# Patient Record
Sex: Male | Born: 1970 | Race: Asian | Hispanic: No | Marital: Married | Smoking: Never smoker
Health system: Southern US, Community
[De-identification: ages and names within clinical notes are randomized; demographics above are authoritative.]

## PROBLEM LIST (undated history)

## (undated) DIAGNOSIS — N200 Calculus of kidney: Secondary | ICD-10-CM

## (undated) DIAGNOSIS — M109 Gout, unspecified: Secondary | ICD-10-CM

## (undated) DIAGNOSIS — N183 Chronic kidney disease, stage 3 unspecified: Secondary | ICD-10-CM

## (undated) DIAGNOSIS — R9431 Abnormal electrocardiogram [ECG] [EKG]: Secondary | ICD-10-CM

## (undated) DIAGNOSIS — Z87442 Personal history of urinary calculi: Secondary | ICD-10-CM

---

## 2009-06-10 ENCOUNTER — Ambulatory Visit: Payer: Self-pay | Admitting: Diagnostic Radiology

## 2009-06-10 ENCOUNTER — Emergency Department (HOSPITAL_BASED_OUTPATIENT_CLINIC_OR_DEPARTMENT_OTHER): Admission: EM | Admit: 2009-06-10 | Discharge: 2009-06-10 | Payer: Self-pay | Admitting: Emergency Medicine

## 2010-11-02 LAB — URINE MICROSCOPIC-ADD ON

## 2010-11-02 LAB — URINE CULTURE

## 2010-11-02 LAB — URINALYSIS, ROUTINE W REFLEX MICROSCOPIC
Bilirubin Urine: NEGATIVE
Hgb urine dipstick: NEGATIVE
Ketones, ur: 15 mg/dL — AB
Specific Gravity, Urine: 1.025 (ref 1.005–1.030)
Urobilinogen, UA: 0.2 mg/dL (ref 0.0–1.0)

## 2013-07-28 ENCOUNTER — Emergency Department (HOSPITAL_BASED_OUTPATIENT_CLINIC_OR_DEPARTMENT_OTHER): Payer: BC Managed Care – PPO

## 2013-07-28 ENCOUNTER — Encounter (HOSPITAL_BASED_OUTPATIENT_CLINIC_OR_DEPARTMENT_OTHER): Payer: Self-pay | Admitting: Emergency Medicine

## 2013-07-28 ENCOUNTER — Inpatient Hospital Stay (HOSPITAL_BASED_OUTPATIENT_CLINIC_OR_DEPARTMENT_OTHER)
Admission: EM | Admit: 2013-07-28 | Discharge: 2013-08-07 | DRG: 871 | Disposition: A | Discharge status: Home / Self Care | Payer: BC Managed Care – PPO | Attending: Internal Medicine | Admitting: Internal Medicine

## 2013-07-28 DIAGNOSIS — R079 Chest pain, unspecified: Secondary | ICD-10-CM

## 2013-07-28 DIAGNOSIS — R7881 Bacteremia: Secondary | ICD-10-CM

## 2013-07-28 DIAGNOSIS — J9601 Acute respiratory failure with hypoxia: Secondary | ICD-10-CM | POA: Diagnosis present

## 2013-07-28 DIAGNOSIS — E872 Acidosis, unspecified: Secondary | ICD-10-CM | POA: Diagnosis present

## 2013-07-28 DIAGNOSIS — A419 Sepsis, unspecified organism: Secondary | ICD-10-CM | POA: Diagnosis present

## 2013-07-28 DIAGNOSIS — R197 Diarrhea, unspecified: Secondary | ICD-10-CM

## 2013-07-28 DIAGNOSIS — G4700 Insomnia, unspecified: Secondary | ICD-10-CM | POA: Diagnosis not present

## 2013-07-28 DIAGNOSIS — R21 Rash and other nonspecific skin eruption: Secondary | ICD-10-CM | POA: Diagnosis present

## 2013-07-28 DIAGNOSIS — E860 Dehydration: Secondary | ICD-10-CM

## 2013-07-28 DIAGNOSIS — E46 Unspecified protein-calorie malnutrition: Secondary | ICD-10-CM | POA: Diagnosis present

## 2013-07-28 DIAGNOSIS — J80 Acute respiratory distress syndrome: Secondary | ICD-10-CM

## 2013-07-28 DIAGNOSIS — E8809 Other disorders of plasma-protein metabolism, not elsewhere classified: Secondary | ICD-10-CM | POA: Diagnosis not present

## 2013-07-28 DIAGNOSIS — N170 Acute kidney failure with tubular necrosis: Secondary | ICD-10-CM | POA: Diagnosis present

## 2013-07-28 DIAGNOSIS — A409 Streptococcal sepsis, unspecified: Principal | ICD-10-CM | POA: Diagnosis present

## 2013-07-28 DIAGNOSIS — R9431 Abnormal electrocardiogram [ECG] [EKG]: Secondary | ICD-10-CM

## 2013-07-28 DIAGNOSIS — R0789 Other chest pain: Secondary | ICD-10-CM

## 2013-07-28 DIAGNOSIS — L0390 Cellulitis, unspecified: Secondary | ICD-10-CM

## 2013-07-28 DIAGNOSIS — E440 Moderate protein-calorie malnutrition: Secondary | ICD-10-CM | POA: Diagnosis not present

## 2013-07-28 DIAGNOSIS — I959 Hypotension, unspecified: Secondary | ICD-10-CM

## 2013-07-28 DIAGNOSIS — N390 Urinary tract infection, site not specified: Secondary | ICD-10-CM

## 2013-07-28 DIAGNOSIS — M6282 Rhabdomyolysis: Secondary | ICD-10-CM

## 2013-07-28 DIAGNOSIS — E871 Hypo-osmolality and hyponatremia: Secondary | ICD-10-CM | POA: Diagnosis not present

## 2013-07-28 DIAGNOSIS — I248 Other forms of acute ischemic heart disease: Secondary | ICD-10-CM | POA: Diagnosis not present

## 2013-07-28 DIAGNOSIS — IMO0002 Reserved for concepts with insufficient information to code with codable children: Secondary | ICD-10-CM | POA: Diagnosis present

## 2013-07-28 DIAGNOSIS — L02219 Cutaneous abscess of trunk, unspecified: Secondary | ICD-10-CM | POA: Diagnosis present

## 2013-07-28 DIAGNOSIS — I2489 Other forms of acute ischemic heart disease: Secondary | ICD-10-CM | POA: Diagnosis not present

## 2013-07-28 DIAGNOSIS — K7689 Other specified diseases of liver: Secondary | ICD-10-CM | POA: Diagnosis present

## 2013-07-28 DIAGNOSIS — D696 Thrombocytopenia, unspecified: Secondary | ICD-10-CM | POA: Diagnosis present

## 2013-07-28 DIAGNOSIS — I219 Acute myocardial infarction, unspecified: Secondary | ICD-10-CM | POA: Diagnosis not present

## 2013-07-28 DIAGNOSIS — J9600 Acute respiratory failure, unspecified whether with hypoxia or hypercapnia: Secondary | ICD-10-CM | POA: Diagnosis not present

## 2013-07-28 DIAGNOSIS — K760 Fatty (change of) liver, not elsewhere classified: Secondary | ICD-10-CM

## 2013-07-28 DIAGNOSIS — N179 Acute kidney failure, unspecified: Secondary | ICD-10-CM

## 2013-07-28 DIAGNOSIS — J690 Pneumonitis due to inhalation of food and vomit: Secondary | ICD-10-CM | POA: Diagnosis not present

## 2013-07-28 DIAGNOSIS — E876 Hypokalemia: Secondary | ICD-10-CM | POA: Diagnosis not present

## 2013-07-28 LAB — CBC WITH DIFFERENTIAL/PLATELET
Band Neutrophils: 5 % (ref 0–10)
Basophils Relative: 0 % (ref 0–1)
HCT: 46.8 % (ref 39.0–52.0)
Hemoglobin: 16.6 g/dL (ref 13.0–17.0)
Lymphs Abs: 0.6 10*3/uL — ABNORMAL LOW (ref 0.7–4.0)
MCH: 30.5 pg (ref 26.0–34.0)
MCHC: 35.5 g/dL (ref 30.0–36.0)
Monocytes Absolute: 0.8 10*3/uL (ref 0.1–1.0)

## 2013-07-28 LAB — COMPREHENSIVE METABOLIC PANEL
BUN: 44 mg/dL — ABNORMAL HIGH (ref 6–23)
Calcium: 9.3 mg/dL (ref 8.4–10.5)
GFR calc Af Amer: 21 mL/min — ABNORMAL LOW (ref >90)
Glucose, Bld: 171 mg/dL — ABNORMAL HIGH (ref 70–99)
Total Protein: 8.3 g/dL (ref 6.0–8.3)

## 2013-07-28 LAB — TROPONIN I: Troponin I: <0.3 ng/mL (ref <0.30)

## 2013-07-28 LAB — D-DIMER, QUANTITATIVE: D-Dimer, Quant: 3.75 ug/mL-FEU — ABNORMAL HIGH (ref 0.00–0.48)

## 2013-07-28 LAB — GLUCOSE, CAPILLARY: Glucose-Capillary: 149 mg/dL — ABNORMAL HIGH (ref 70–99)

## 2013-07-28 LAB — PROTIME-INR: Prothrombin Time: 14 seconds (ref 11.6–15.2)

## 2013-07-28 LAB — PRO B NATRIURETIC PEPTIDE: Pro B Natriuretic peptide (BNP): 500.6 pg/mL — ABNORMAL HIGH (ref 0–125)

## 2013-07-28 MED ORDER — SODIUM CHLORIDE 0.9 % IV SOLN
INTRAVENOUS | Status: DC
  Administered 2013-07-28: 125 mL/h via INTRAVENOUS

## 2013-07-28 MED ORDER — SODIUM CHLORIDE 0.9 % IV BOLUS (SEPSIS)
1000.0000 mL | Freq: Once | INTRAVENOUS | Status: AC
  Administered 2013-07-28: 1000 mL via INTRAVENOUS

## 2013-07-28 MED ORDER — SODIUM CHLORIDE 0.9 % IV BOLUS (SEPSIS)
1000.0000 mL | Freq: Once | INTRAVENOUS | Status: AC
  Administered 2013-07-28 (×2): 1000 mL via INTRAVENOUS

## 2013-07-28 MED ORDER — ONDANSETRON HCL 4 MG/2ML IJ SOLN
4.0000 mg | Freq: Three times a day (TID) | INTRAMUSCULAR | Status: DC | PRN
  Administered 2013-07-28: 4 mg via INTRAVENOUS
  Filled 2013-07-28: qty 2

## 2013-07-28 MED ORDER — FENTANYL CITRATE 0.05 MG/ML IJ SOLN
100.0000 ug | Freq: Once | INTRAMUSCULAR | Status: AC
  Administered 2013-07-28: 100 ug via INTRAVENOUS
  Filled 2013-07-28: qty 2

## 2013-07-28 MED ORDER — PIPERACILLIN-TAZOBACTAM 3.375 G IVPB
3.3750 g | Freq: Once | INTRAVENOUS | Status: AC
  Administered 2013-07-28: 3.375 g via INTRAVENOUS
  Filled 2013-07-28: qty 50

## 2013-07-28 MED ORDER — HYDROMORPHONE HCL PF 1 MG/ML IJ SOLN
1.0000 mg | INTRAMUSCULAR | Status: DC | PRN
  Administered 2013-07-28 – 2013-07-29 (×2): 1 mg via INTRAVENOUS
  Filled 2013-07-28 (×2): qty 1

## 2013-07-28 MED ORDER — HEPARIN BOLUS VIA INFUSION
5000.0000 [IU] | Freq: Once | INTRAVENOUS | Status: AC
  Administered 2013-07-28: 5000 [IU] via INTRAVENOUS

## 2013-07-28 MED ORDER — VANCOMYCIN HCL IN DEXTROSE 1-5 GM/200ML-% IV SOLN
1000.0000 mg | Freq: Once | INTRAVENOUS | Status: AC
  Administered 2013-07-28: 1000 mg via INTRAVENOUS
  Filled 2013-07-28: qty 200

## 2013-07-28 MED ORDER — HEPARIN (PORCINE) IN NACL 100-0.45 UNIT/ML-% IJ SOLN
1700.0000 [IU]/h | INTRAMUSCULAR | Status: DC
  Administered 2013-07-28: 1700 [IU]/h via INTRAVENOUS
  Filled 2013-07-28 (×2): qty 250

## 2013-07-28 MED ORDER — ONDANSETRON HCL 4 MG/2ML IJ SOLN
4.0000 mg | Freq: Once | INTRAMUSCULAR | Status: AC
  Administered 2013-07-28: 4 mg via INTRAVENOUS
  Filled 2013-07-28: qty 2

## 2013-07-28 MED ORDER — HEPARIN BOLUS VIA INFUSION: 4000.0000 [IU] | Freq: Once | INTRAVENOUS | Status: DC

## 2013-07-28 NOTE — Progress Notes (Signed)
ANTICOAGULATION CONSULT NOTE - Initial Consult  Pharmacy Consult for Heparin Indication: pulmonary embolus  No Known Allergies  Patient Measurements: Height: 5\' 11"  (180.3 cm) Weight: 210 lb (95.255 kg) IBW/kg (Calculated) : 75.3 Heparin Dosing Weight: 81 kg  Vital Signs: Temp: 97.9 F (36.6 C) (12/29 1913) Temp src: Rectal (12/29 1913) BP: 132/89 mmHg (12/29 1955) Pulse Rate: 97 (12/29 1955)  Labs:  Recent Labs  07/28/13 1911  HGB 16.6  HCT 46.8  PLT 179  LABPROT 14.0  INR 1.10  CREATININE 3.80*  TROPONINI <0.30   Estimated Creatinine Clearance: 29.2 ml/min (by C-G formula based on Cr of 3.8).  Medical History: History reviewed. No pertinent past medical history.  Assessment: 42yo presents with c/o chest pain and an elevated D.Dimer with w/u for r/o PE.  We have been asked to initiate IV heparin.  Pt. has some renal insufficiency, H/H stable as well as platelets.  Goal of Therapy:  Heparin level 0.3-0.7 units/ml Monitor platelets by anticoagulation protocol: Yes   Plan:  1.  Will give Heparin 5000 units bolus x 1 2.  Begin IV Heparin at 1700 units/hr. 3.  Obtain a heparin level in 6 hours and adjust as needed.  Nadara Mustard, PharmD., MS Clinical Pharmacist Pager:  [phone] Thank you for allowing pharmacy to be part of this patients care team. 07/28/2013,8:23 PM

## 2013-07-28 NOTE — ED Notes (Signed)
Latic Acid reported to Dr. Manus Gunning @ 1919 of 6.2.  Vinson Moselle, RN as well notified.

## 2013-07-28 NOTE — ED Provider Notes (Addendum)
CSN: [id number]     Arrival date & time 07/28/13  1839 History   First MD Initiated Contact with Patient 07/28/13 1858     This chart was scribed for Glynn Octave, MD by Arlan Organ, ED Scribe. This patient was seen in room MH05/MH05 and the patient's care was started 7:00 PM.   Chief Complaint  Patient presents with  . Chest Pain   The history is provided by the patient. No language interpreter was used.    HPI Comments: Andrew Hogan is a 42 y.o. male who presents to the Emergency Department complaining of unchanged, constant right sided CP that started 3 days ago. Pt states the pain initially came on after lifting a heavy box. He also reports pain to his whole right side and a dry cough. He denies any falls or recent trauma. He has tried 200 mg ibuprofen and hot compresses to the area with no relief. Wife states he experienced blurred vision today for a short period of time, but states his vision returned back to normal. He denies back pain, abdominal pain, emesis, or dizziness. He denies any known allergies. He denies any heart problems. He denies any lung problems.   He is followed by [facility] History reviewed. No pertinent past medical history. History reviewed. No pertinent past surgical history. No family history on file. History  Substance Use Topics  . Smoking status: Never Smoker   . Smokeless tobacco: Not on file  . Alcohol Use: No    Review of Systems  Allergies  Review of patient's allergies indicates no known allergies.  Home Medications  No current outpatient prescriptions on file.  Triage Vitals: BP 82/53  Pulse 96  Temp(Src) 97.9 F (36.6 C) (Rectal)  Resp 32  Ht 5\' 11"  (1.803 m)  Wt 210 lb (95.255 kg)  BMI 29.30 kg/m2  SpO2 100%  Physical Exam  Nursing note and vitals reviewed. Constitutional: He is oriented to person, place, and time. He appears well-developed and well-nourished. He appears distressed.  HENT:  Head: Normocephalic.  Mouth/Throat:  Oropharynx is clear and moist. No oropharyngeal exudate.  Eyes: Conjunctivae and EOM are normal. Pupils are equal, round, and reactive to light.  Neck: Normal range of motion. Neck supple.  Cardiovascular: Normal rate and normal heart sounds.   No murmur heard. Tachycardic   Pulmonary/Chest: Effort normal. No respiratory distress. He exhibits tenderness.  Breath sounds equal bilaterally Right chest wall tenderness Spunting respirations  Tachypneic  Abdominal: Soft. Bowel sounds are normal. He exhibits no distension. There is no tenderness.  Musculoskeletal: Normal range of motion. He exhibits no edema and no tenderness.  Neurological: He is alert and oriented to person, place, and time.  Skin:  Abrasion and cellulitis to left axilla Ecchymoses scattered across anterior chest Tenderness to R chest wall with erythema.  No crepitance  Psychiatric: He has a normal mood and affect.    ED Course  Procedures (including critical care time)  DIAGNOSTIC STUDIES: Oxygen Saturation is 100% on RA, Normal by my interpretation.    COORDINATION OF CARE: 7:00 PM-Discussed treatment plan with pt at bedside and pt agreed to plan.     Labs Review Labs Reviewed  CBC WITH DIFFERENTIAL - Abnormal; Notable for the following:    WBC 12.7 (*)    Neutrophils Relative % 82 (*)    Lymphocytes Relative 5 (*)    Neutro Abs 11.0 (*)    Lymphs Abs 0.6 (*)    All other components within normal limits  COMPREHENSIVE METABOLIC PANEL - Abnormal; Notable for the following:    Sodium 130 (*)    Chloride 87 (*)    Glucose, Bld 171 (*)    BUN 44 (*)    Creatinine, Ser 3.80 (*)    Albumin 3.2 (*)    AST 41 (*)    Total Bilirubin 2.0 (*)    GFR calc non Af Amer 18 (*)    GFR calc Af Amer 21 (*)    All other components within normal limits  D-DIMER, QUANTITATIVE - Abnormal; Notable for the following:    D-Dimer, Quant 3.75 (*)    All other components within normal limits  PRO B NATRIURETIC PEPTIDE -  Abnormal; Notable for the following:    Pro B Natriuretic peptide (BNP) 500.6 (*)    All other components within normal limits  GLUCOSE, CAPILLARY - Abnormal; Notable for the following:    Glucose-Capillary 149 (*)    All other components within normal limits  CG4 I-STAT (LACTIC ACID) - Abnormal; Notable for the following:    Lactic Acid, Venous 6.21 (*)    All other components within normal limits  CULTURE, BLOOD (ROUTINE X 2)  CULTURE, BLOOD (ROUTINE X 2)  URINE CULTURE  PROTIME-INR  TROPONIN I  HEPARIN LEVEL (UNFRACTIONATED)  URINALYSIS, ROUTINE W REFLEX MICROSCOPIC   Imaging Review Dg Chest Portable 1 View  07/28/2013   CLINICAL DATA:  Chest pain  EXAM: PORTABLE CHEST - 1 VIEW  COMPARISON:  None.  FINDINGS: The heart size and mediastinal contours are within normal limits. There is no focal infiltrate, pulmonary edema, or pleural effusion. The visualized skeletal structures are unremarkable.  IMPRESSION: No active disease.   Electronically Signed   By: Sherian Rein M.D.   On: 07/28/2013 19:37    EKG Interpretation    Date/Time:  Monday July 28 2013 18:58:28 EST Ventricular Rate:  99 PR Interval:  148 QRS Duration: 88 QT Interval:  330 QTC Calculation: 423 R Axis:   77 Text Interpretation:  Normal sinus rhythm Nonspecific T wave abnormality Abnormal ECG No previous ECGs available Confirmed by Manus Gunning  MD, Samarie Pinder ([id number]) on 07/28/2013 7:28:05 PM            MDM   1. Chest pain   2. Hypotension    Patient with 3 days of right-sided chest pain after lifting a box of 20 pounds. It is worse with movement. Found to be uncomfortable, tachypnea and hypotensive in triage. Appears to have cellulitis in L axilla. EKG with normal sinus rhythm, nonspecific T wave abnormalities. Chest x-ray negative. Breath sounds equal bilaterally, no pneumothorax. No pneumonia.  Lactate elevated at 6.1. Patient has hypothermia. Empiric antibiotics given. With pleuritic chest pain,  concern for pulmonary embolism. Patient has acute renal failure and cannot receive IV contrast.  After IV fluids, blood pressure improved to 125 systolic. Cr. 3.8.. Started on empiric heparin for possible PE. Unclear source of elevated lactate. Likely sepsis from chest wall cellulitis.  No abdominal pain.  Empiric antibiotics given and cultures obtained.   CRITICAL CARE Performed by: Glynn Octave Total critical care time: 30 Critical care time was exclusive of separately billable procedures and treating other patients. Critical care was necessary to treat or prevent imminent or life-threatening deterioration. Critical care was time spent personally by me on the following activities: development of treatment plan with patient and/or surrogate as well as nursing, discussions with consultants, evaluation of patient's response to treatment, examination of patient, obtaining history from patient or  surrogate, ordering and performing treatments and interventions, ordering and review of laboratory studies, ordering and review of radiographic studies, pulse oximetry and re-evaluation of patient's condition.    EMERGENCY DEPARTMENT [place] CARDIAC EXAM "Study: Limited Ultrasound of the heart and pericardium"  INDICATIONS:Hypotension Multiple views of the heart and pericardium are obtained with a multi-frequency probe.  PERFORMED ZO:[id number]  IMAGES ARCHIVED?: No  FINDINGS: No pericardial effusion, Normal contractility and Tamponade physiology absent  LIMITATIONS:  Emergent procedure  VIEWS USED: Subcostal 4 chamber and Parasternal long axis  INTERPRETATION: Cardiac activity present and Pericardial effusioin absent  COMMENT:  No R heart strain, no effusion.   I personally performed the services described in this documentation, which was scribed in my presence. The recorded information has been reviewed and is accurate.   Glynn Octave, MD 07/29/13 [id number]  Glynn Octave, MD 07/29/13  [phone]

## 2013-07-28 NOTE — ED Notes (Signed)
C/o right side CP x 4 days after lifting box-pain worse with movement-pt has been taking hydrocodone and ibuprofen without relief-states he thinks the hydrocodone is making him sick-nausea w/o vomiting

## 2013-07-29 ENCOUNTER — Other Ambulatory Visit: Payer: Self-pay

## 2013-07-29 ENCOUNTER — Inpatient Hospital Stay (HOSPITAL_COMMUNITY): Payer: BC Managed Care – PPO

## 2013-07-29 DIAGNOSIS — R7881 Bacteremia: Secondary | ICD-10-CM

## 2013-07-29 DIAGNOSIS — IMO0002 Reserved for concepts with insufficient information to code with codable children: Secondary | ICD-10-CM

## 2013-07-29 DIAGNOSIS — E872 Acidosis, unspecified: Secondary | ICD-10-CM | POA: Diagnosis present

## 2013-07-29 DIAGNOSIS — K760 Fatty (change of) liver, not elsewhere classified: Secondary | ICD-10-CM | POA: Diagnosis present

## 2013-07-29 DIAGNOSIS — I517 Cardiomegaly: Secondary | ICD-10-CM

## 2013-07-29 DIAGNOSIS — E876 Hypokalemia: Secondary | ICD-10-CM | POA: Diagnosis present

## 2013-07-29 DIAGNOSIS — R21 Rash and other nonspecific skin eruption: Secondary | ICD-10-CM | POA: Diagnosis present

## 2013-07-29 DIAGNOSIS — M6282 Rhabdomyolysis: Secondary | ICD-10-CM | POA: Diagnosis present

## 2013-07-29 DIAGNOSIS — R079 Chest pain, unspecified: Secondary | ICD-10-CM

## 2013-07-29 DIAGNOSIS — R000 Tachycardia, unspecified: Secondary | ICD-10-CM

## 2013-07-29 DIAGNOSIS — R197 Diarrhea, unspecified: Secondary | ICD-10-CM | POA: Diagnosis present

## 2013-07-29 DIAGNOSIS — E860 Dehydration: Secondary | ICD-10-CM | POA: Diagnosis present

## 2013-07-29 DIAGNOSIS — L0291 Cutaneous abscess, unspecified: Secondary | ICD-10-CM

## 2013-07-29 DIAGNOSIS — A419 Sepsis, unspecified organism: Secondary | ICD-10-CM | POA: Diagnosis present

## 2013-07-29 DIAGNOSIS — N179 Acute kidney failure, unspecified: Secondary | ICD-10-CM

## 2013-07-29 DIAGNOSIS — R071 Chest pain on breathing: Secondary | ICD-10-CM

## 2013-07-29 DIAGNOSIS — L0390 Cellulitis, unspecified: Secondary | ICD-10-CM | POA: Diagnosis present

## 2013-07-29 LAB — COMPREHENSIVE METABOLIC PANEL
ALT: 31 U/L (ref 0–53)
AST: 39 U/L — ABNORMAL HIGH (ref 0–37)
Calcium: 7.2 mg/dL — ABNORMAL LOW (ref 8.4–10.5)
Chloride: 95 mEq/L — ABNORMAL LOW (ref 96–112)
Creatinine, Ser: 3.42 mg/dL — ABNORMAL HIGH (ref 0.50–1.35)
GFR calc Af Amer: 24 mL/min — ABNORMAL LOW (ref >90)
GFR calc non Af Amer: 20 mL/min — ABNORMAL LOW (ref >90)
Sodium: 133 mEq/L — ABNORMAL LOW (ref 137–147)
Total Protein: 5.9 g/dL — ABNORMAL LOW (ref 6.0–8.3)

## 2013-07-29 LAB — CBC WITH DIFFERENTIAL/PLATELET
Basophils Absolute: 0.1 10*3/uL (ref 0.0–0.1)
Basophils Relative: 1 % (ref 0–1)
Eosinophils Relative: 1 % (ref 0–5)
Hemoglobin: 15.9 g/dL (ref 13.0–17.0)
Lymphocytes Relative: 6 % — ABNORMAL LOW (ref 12–46)
Lymphs Abs: 0.3 10*3/uL — ABNORMAL LOW (ref 0.7–4.0)
MCV: 86.2 fL (ref 78.0–100.0)
Monocytes Relative: 3 % (ref 3–12)
Neutrophils Relative %: 89 % — ABNORMAL HIGH (ref 43–77)
Platelets: 133 10*3/uL — ABNORMAL LOW (ref 150–400)
RBC: 5.15 MIL/uL (ref 4.22–5.81)
RDW: 12.9 % (ref 11.5–15.5)
WBC Morphology: INCREASED
WBC: 5.8 10*3/uL (ref 4.0–10.5)

## 2013-07-29 LAB — BLOOD GAS, VENOUS
Acid-base deficit: 8.9 mmol/L — ABNORMAL HIGH (ref 0.0–2.0)
Bicarbonate: 16.5 mEq/L — ABNORMAL LOW (ref 20.0–24.0)
FIO2: 0.28 %
Patient temperature: 98.6
TCO2: 17.6 mmol/L (ref 0–100)
pCO2, Ven: 35.8 mmHg — ABNORMAL LOW (ref 45.0–50.0)

## 2013-07-29 LAB — URINALYSIS, ROUTINE W REFLEX MICROSCOPIC
Glucose, UA: 100 mg/dL — AB
Ketones, ur: 15 mg/dL — AB
Nitrite: NEGATIVE
Protein, ur: 100 mg/dL — AB
pH: 5 (ref 5.0–8.0)

## 2013-07-29 LAB — CBC
HCT: 45.9 % (ref 39.0–52.0)
HCT: 41.7 % (ref 39.0–52.0)
Hemoglobin: 14.9 g/dL (ref 13.0–17.0)
MCH: 30.7 pg (ref 26.0–34.0)
MCV: 86.6 fL (ref 78.0–100.0)
MCV: 86 fL (ref 78.0–100.0)
Platelets: 156 10*3/uL (ref 150–400)
RBC: 5.3 MIL/uL (ref 4.22–5.81)
RBC: 4.85 MIL/uL (ref 4.22–5.81)
WBC: 5.4 10*3/uL (ref 4.0–10.5)
WBC: 7.1 10*3/uL (ref 4.0–10.5)

## 2013-07-29 LAB — APTT: aPTT: 46 seconds — ABNORMAL HIGH (ref 24–37)

## 2013-07-29 LAB — BASIC METABOLIC PANEL WITH GFR
BUN: 64 mg/dL — ABNORMAL HIGH (ref 6–23)
CO2: 16 meq/L — ABNORMAL LOW (ref 19–32)
Calcium: 6.6 mg/dL — ABNORMAL LOW (ref 8.4–10.5)
Chloride: 92 meq/L — ABNORMAL LOW (ref 96–112)
Creatinine, Ser: 4.72 mg/dL — ABNORMAL HIGH (ref 0.50–1.35)
GFR calc Af Amer: 16 mL/min — ABNORMAL LOW
GFR calc non Af Amer: 14 mL/min — ABNORMAL LOW
Glucose, Bld: 192 mg/dL — ABNORMAL HIGH (ref 70–99)
Potassium: 3.4 meq/L — ABNORMAL LOW (ref 3.7–5.3)
Sodium: 130 meq/L — ABNORMAL LOW (ref 137–147)

## 2013-07-29 LAB — PROCALCITONIN: Procalcitonin: 54.96 ng/mL

## 2013-07-29 LAB — BASIC METABOLIC PANEL
BUN: 56 mg/dL — ABNORMAL HIGH (ref 6–23)
CO2: 13 mEq/L — ABNORMAL LOW (ref 19–32)
Calcium: 7.2 mg/dL — ABNORMAL LOW (ref 8.4–10.5)
Chloride: 95 mEq/L — ABNORMAL LOW (ref 96–112)
GFR calc non Af Amer: 15 mL/min — ABNORMAL LOW (ref >90)
Glucose, Bld: 111 mg/dL — ABNORMAL HIGH (ref 70–99)
Potassium: 3.8 mEq/L (ref 3.7–5.3)
Sodium: 133 mEq/L — ABNORMAL LOW (ref 137–147)

## 2013-07-29 LAB — PROTIME-INR: Prothrombin Time: 15.4 seconds — ABNORMAL HIGH (ref 11.6–15.2)

## 2013-07-29 LAB — AMYLASE: Amylase: 12 U/L (ref 0–105)

## 2013-07-29 LAB — URINE MICROSCOPIC-ADD ON

## 2013-07-29 LAB — LACTIC ACID, PLASMA: Lactic Acid, Venous: 3.5 mmol/L — ABNORMAL HIGH (ref 0.5–2.2)

## 2013-07-29 MED ORDER — HEPARIN SODIUM (PORCINE) 5000 UNIT/ML IJ SOLN
5000.0000 [IU] | Freq: Three times a day (TID) | INTRAMUSCULAR | Status: DC
  Administered 2013-07-29 – 2013-07-30 (×6): 5000 [IU] via SUBCUTANEOUS
  Filled 2013-07-29 (×8): qty 1

## 2013-07-29 MED ORDER — PIPERACILLIN-TAZOBACTAM 3.375 G IVPB
3.3750 g | Freq: Three times a day (TID) | INTRAVENOUS | Status: DC
  Administered 2013-07-29 – 2013-07-30 (×4): 3.375 g via INTRAVENOUS
  Filled 2013-07-29 (×6): qty 50

## 2013-07-29 MED ORDER — HYDROMORPHONE HCL PF 1 MG/ML IJ SOLN
1.0000 mg | INTRAMUSCULAR | Status: AC | PRN
  Administered 2013-07-29: 1 mg via INTRAVENOUS
  Filled 2013-07-29: qty 1

## 2013-07-29 MED ORDER — SODIUM CHLORIDE 0.9 % IJ SOLN
3.0000 mL | Freq: Two times a day (BID) | INTRAMUSCULAR | Status: DC
  Administered 2013-07-30 – 2013-08-07 (×10): 3 mL via INTRAVENOUS

## 2013-07-29 MED ORDER — ONDANSETRON HCL 4 MG/2ML IJ SOLN
4.0000 mg | Freq: Four times a day (QID) | INTRAMUSCULAR | Status: DC | PRN
  Administered 2013-07-30 – 2013-07-31 (×3): 4 mg via INTRAVENOUS
  Filled 2013-07-29 (×3): qty 2

## 2013-07-29 MED ORDER — ALBUMIN HUMAN 5 % IV SOLN
INTRAVENOUS | Status: AC
  Filled 2013-07-29: qty 250

## 2013-07-29 MED ORDER — ACETAMINOPHEN 650 MG RE SUPP: 650.0000 mg | Freq: Four times a day (QID) | RECTAL | Status: DC | PRN

## 2013-07-29 MED ORDER — SODIUM CHLORIDE 0.9 % IV SOLN
INTRAVENOUS | Status: DC
  Administered 2013-07-29: 125 mL/h via INTRAVENOUS
  Administered 2013-07-29: 09:00:00 via INTRAVENOUS

## 2013-07-29 MED ORDER — SODIUM CHLORIDE 0.9 % IV BOLUS (SEPSIS)
1000.0000 mL | Freq: Once | INTRAVENOUS | Status: AC
  Administered 2013-07-29: 1000 mL via INTRAVENOUS

## 2013-07-29 MED ORDER — ALBUMIN HUMAN 5 % IV SOLN
12.5000 g | Freq: Once | INTRAVENOUS | Status: AC
  Administered 2013-07-29: 12.5 g via INTRAVENOUS

## 2013-07-29 MED ORDER — VANCOMYCIN HCL IN DEXTROSE 1-5 GM/200ML-% IV SOLN: 1000.0000 mg | INTRAVENOUS | Status: DC

## 2013-07-29 MED ORDER — ACETAMINOPHEN 325 MG PO TABS
650.0000 mg | ORAL_TABLET | Freq: Four times a day (QID) | ORAL | Status: DC | PRN
  Administered 2013-07-31: 650 mg via ORAL
  Filled 2013-07-29: qty 2

## 2013-07-29 MED ORDER — ONDANSETRON HCL 4 MG PO TABS: 4.0000 mg | ORAL_TABLET | Freq: Four times a day (QID) | ORAL | Status: DC | PRN

## 2013-07-29 MED ORDER — SODIUM CHLORIDE 0.9 % IV BOLUS (SEPSIS)
500.0000 mL | Freq: Once | INTRAVENOUS | Status: AC
  Administered 2013-07-29: 500 mL via INTRAVENOUS

## 2013-07-29 MED ORDER — LIDOCAINE 5 % EX PTCH
2.0000 | MEDICATED_PATCH | CUTANEOUS | Status: DC
  Administered 2013-07-29 – 2013-07-30 (×2): 2 via TRANSDERMAL
  Administered 2013-07-31: 1 via TRANSDERMAL
  Administered 2013-08-07: 2 via TRANSDERMAL
  Filled 2013-07-29 (×12): qty 2

## 2013-07-29 MED ORDER — SODIUM CHLORIDE 0.9 % IV BOLUS (SEPSIS): 500.0000 mL | Freq: Once | INTRAVENOUS | Status: DC

## 2013-07-29 MED ORDER — SODIUM BICARBONATE 8.4 % IV SOLN
INTRAVENOUS | Status: DC
  Administered 2013-07-29 – 2013-08-01 (×5): via INTRAVENOUS
  Filled 2013-07-29 (×13): qty 150

## 2013-07-29 MED ORDER — VANCOMYCIN HCL IN DEXTROSE 1-5 GM/200ML-% IV SOLN
1000.0000 mg | Freq: Once | INTRAVENOUS | Status: AC
  Administered 2013-07-29: 1000 mg via INTRAVENOUS
  Filled 2013-07-29: qty 200

## 2013-07-29 NOTE — Progress Notes (Signed)
[facility]  2D Echocardiogram completed.  Montgomery Favor L Rubyann Lingle, RDCS 07/29/2013 10:43 AM

## 2013-07-29 NOTE — Progress Notes (Signed)
ANTIBIOTIC CONSULT NOTE - INITIAL  Pharmacy Consult for Vancomycin and Zosyn  Indication: cellulitis  No Known Allergies  Patient Measurements: Height: 5\' 11"  (180.3 cm) Weight: 215 lb 2.7 oz (97.6 kg) IBW/kg (Calculated) : 75.3  Vital Signs: Temp: 98 F (36.7 C) (12/29 2324) Temp src: Oral (12/29 2324) BP: 114/53 mmHg (12/30 0200) Pulse Rate: 118 (12/30 0200) Intake/Output from previous Due: 12/29 0701 - 12/30 0700 In: 250 [I.V.:250] Out: -  Intake/Output from this shift: Total I/O In: 250 [I.V.:250] Out: -   Labs:  Recent Labs  07/28/13 1911  WBC 12.7*  HGB 16.6  PLT 179  CREATININE 3.80*   Estimated Creatinine Clearance: 29.5 ml/min (by C-G formula based on Cr of 3.8). No results found for this basename: VANCOTROUGH, VANCOPEAK, VANCORANDOM, GENTTROUGH, GENTPEAK, GENTRANDOM, TOBRATROUGH, TOBRAPEAK, TOBRARND, AMIKACINPEAK, AMIKACINTROU, AMIKACIN,  in the last 72 hours   Microbiology: No results found for this or any previous visit (from the past 720 hour(s)).  Medical History: History reviewed. No pertinent past medical history.  Assessment: 42 yo male with possible cellulitis for empiric antibiotics.  Vancomycin 1 g IV given in ED at 8 pm last night  Goal of Therapy:  Vancomycin trough level 10-15 mcg/ml  Plan:  Vancomycin 1 g IV now for total for 2 g tonight, then 1 g IV q48h Zosyn 3.375 g IV q8h  F/U renal function    Verdun Rackley, Gary Fleet 07/29/2013,2:57 AM

## 2013-07-29 NOTE — Consult Note (Signed)
Reason for Consult:rule out nec fasciitis Referring Physician: Dr Carmelina Peal is an 42 y.o. male.  HPI: this is a 42 year old patient male who was admitted earlier this evening for pleuritic chest pain, hypotension, and abnormal labs. He had an elevated d-dimer and was started empirically on a heparin drip empirically for pulmonary embolism. He was unable to have a CT PE protocol study due to acute kidney injury. Moreover on physical exam he was found to have extensive cellulitis involving his right chest wall and Gen. Surgery was consulted because of concerns for necrotizing fasciitis. He states about a week ago he did some repetitive heavy lifting for over an hour and a half. He immediately began to have right-sided chest and flank pain and discomfort persisted continuously for several days. He had some leftover hydrocodone from prior back pain which he took without any relief. He also took 8 ibuprofen 6 yesterday without any resolution of the discomfort. After taking the hydrocodone and ibuprofen he developed multiple episodes of loose stool. Over the past 2 days he also reports subjective fevers and chills. He denies any trauma to the area. He denies any foreign travel except to [place] in October. He denies any cuts or lacerations. He reports that he hasn't had an appetite for 2 days. He reports decreased urination. He denies any shortness of breath or chest pain. He denies any pain urinating. He denies any melena or hematochezia. He is not a smoker  History reviewed. No pertinent past medical history.  History reviewed. No pertinent past surgical history.  No family history on file.  Social History:  reports that he has never smoked. He does not have any smokeless tobacco history on file. He reports that he does not drink alcohol or use illicit drugs.  Allergies: No Known Allergies  Medications: I have reviewed the patient's current medications.  Results for orders placed during the hospital  encounter of 07/28/13 (from the past 48 hour(s))  CBC WITH DIFFERENTIAL     Status: Abnormal   Collection Time    07/28/13  7:11 PM      Result Value Range   WBC 12.7 (*) 4.0 - 10.5 K/uL   RBC 5.45  4.22 - 5.81 MIL/uL   Hemoglobin 16.6  13.0 - 17.0 g/dL   HCT 78.2  95.6 - 21.3 %   MCV 85.9  78.0 - 100.0 fL   MCH 30.5  26.0 - 34.0 pg   MCHC 35.5  30.0 - 36.0 g/dL   RDW 08.6  57.8 - 46.9 %   Platelets 179  150 - 400 K/uL   Neutrophils Relative % 82 (*) 43 - 77 %   Lymphocytes Relative 5 (*) 12 - 46 %   Monocytes Relative 6  3 - 12 %   Eosinophils Relative 2  0 - 5 %   Basophils Relative 0  0 - 1 %   Band Neutrophils 5  0 - 10 %   Neutro Abs 11.0 (*) 1.7 - 7.7 K/uL   Lymphs Abs 0.6 (*) 0.7 - 4.0 K/uL   Monocytes Absolute 0.8  0.1 - 1.0 K/uL   Eosinophils Absolute 0.3  0.0 - 0.7 K/uL   Basophils Absolute 0.0  0.0 - 0.1 K/uL  COMPREHENSIVE METABOLIC PANEL     Status: Abnormal   Collection Time    07/28/13  7:11 PM      Result Value Range   Sodium 130 (*) 135 - 145 mEq/L   Potassium 3.8  3.5 - 5.1 mEq/L   Chloride 87 (*) 96 - 112 mEq/L   CO2 20  19 - 32 mEq/L   Glucose, Bld 171 (*) 70 - 99 mg/dL   BUN 44 (*) 6 - 23 mg/dL   Creatinine, Ser 1.47 (*) 0.50 - 1.35 mg/dL   Calcium 9.3  8.4 - 82.9 mg/dL   Total Protein 8.3  6.0 - 8.3 g/dL   Albumin 3.2 (*) 3.5 - 5.2 g/dL   AST 41 (*) 0 - 37 U/L   ALT 40  0 - 53 U/L   Alkaline Phosphatase 72  39 - 117 U/L   Total Bilirubin 2.0 (*) 0.3 - 1.2 mg/dL   GFR calc non Af Amer 18 (*) >90 mL/min   GFR calc Af Amer 21 (*) >90 mL/min   Comment: (NOTE)     The eGFR has been calculated using the CKD EPI equation.     This calculation has not been validated in all clinical situations.     eGFR's persistently <90 mL/min signify possible Chronic Kidney     Disease.  PROTIME-INR     Status: None   Collection Time    07/28/13  7:11 PM      Result Value Range   Prothrombin Time 14.0  11.6 - 15.2 seconds   INR 1.10  0.00 - 1.49  TROPONIN I      Status: None   Collection Time    07/28/13  7:11 PM      Result Value Range   Troponin I <0.30  <0.30 ng/mL   Comment:            Due to the release kinetics of cTnI,     a negative result within the first hours     of the onset of symptoms does not rule out     myocardial infarction with certainty.     If myocardial infarction is still suspected,     repeat the test at appropriate intervals.  D-DIMER, QUANTITATIVE     Status: Abnormal   Collection Time    07/28/13  7:11 PM      Result Value Range   D-Dimer, Quant 3.75 (*) 0.00 - 0.48 ug/mL-FEU   Comment:            AT THE INHOUSE ESTABLISHED CUTOFF     VALUE OF 0.48 ug/mL FEU,     THIS ASSAY HAS BEEN DOCUMENTED     IN THE LITERATURE TO HAVE     A SENSITIVITY AND NEGATIVE     PREDICTIVE VALUE OF AT LEAST     98 TO 99%.  THE TEST RESULT     SHOULD BE CORRELATED WITH     AN ASSESSMENT OF THE CLINICAL     PROBABILITY OF DVT / VTE.  PRO B NATRIURETIC PEPTIDE     Status: Abnormal   Collection Time    07/28/13  7:11 PM      Result Value Range   Pro B Natriuretic peptide (BNP) 500.6 (*) 0 - 125 pg/mL  CG4 I-STAT (LACTIC ACID)     Status: Abnormal   Collection Time    07/28/13  7:18 PM      Result Value Range   Lactic Acid, Venous 6.21 (*) 0.5 - 2.2 mmol/L  GLUCOSE, CAPILLARY     Status: Abnormal   Collection Time    07/28/13  8:05 PM      Result Value Range   Glucose-Capillary 149 (*) 70 - 99  mg/dL   Comment 1 NOTIFY MD    URINALYSIS, ROUTINE W REFLEX MICROSCOPIC     Status: Abnormal   Collection Time    07/29/13 12:43 AM      Result Value Range   Color, Urine ORANGE (*) YELLOW   Comment: BIOCHEMICALS MAY BE AFFECTED BY COLOR   APPearance TURBID (*) CLEAR   Specific Gravity, Urine 1.025  1.005 - 1.030   pH 5.0  5.0 - 8.0   Glucose, UA 100 (*) NEGATIVE mg/dL   Hgb urine dipstick LARGE (*) NEGATIVE   Bilirubin Urine SMALL (*) NEGATIVE   Ketones, ur 15 (*) NEGATIVE mg/dL   Protein, ur 191 (*) NEGATIVE mg/dL    Urobilinogen, UA 1.0  0.0 - 1.0 mg/dL   Nitrite NEGATIVE  NEGATIVE   Leukocytes, UA MODERATE (*) NEGATIVE  URINE MICROSCOPIC-ADD ON     Status: Abnormal   Collection Time    07/29/13 12:43 AM      Result Value Range   Squamous Epithelial / LPF FEW (*) RARE   WBC, UA 11-20  <3 WBC/hpf   RBC / HPF 11-20  <3 RBC/hpf   Bacteria, UA MANY (*) RARE   Casts GRANULAR CAST (*) NEGATIVE  CBC     Status: Abnormal   Collection Time    07/29/13  2:05 AM      Result Value Range   WBC 5.4  4.0 - 10.5 K/uL   RBC 5.30  4.22 - 5.81 MIL/uL   Hemoglobin 16.2  13.0 - 17.0 g/dL   HCT 47.8  29.5 - 62.1 %   MCV 86.6  78.0 - 100.0 fL   MCH 30.6  26.0 - 34.0 pg   MCHC 35.3  30.0 - 36.0 g/dL   RDW 30.8  65.7 - 84.6 %   Platelets 129 (*) 150 - 400 K/uL  CBC WITH DIFFERENTIAL     Status: Abnormal   Collection Time    07/29/13  3:00 AM      Result Value Range   WBC 5.8  4.0 - 10.5 K/uL   RBC 5.15  4.22 - 5.81 MIL/uL   Hemoglobin 15.9  13.0 - 17.0 g/dL   HCT 96.2  95.2 - 84.1 %   MCV 86.2  78.0 - 100.0 fL   MCH 30.9  26.0 - 34.0 pg   MCHC 35.8  30.0 - 36.0 g/dL   RDW 32.4  40.1 - 02.7 %   Platelets 133 (*) 150 - 400 K/uL   Neutrophils Relative % 89 (*) 43 - 77 %   Lymphocytes Relative 6 (*) 12 - 46 %   Monocytes Relative 3  3 - 12 %   Eosinophils Relative 1  0 - 5 %   Basophils Relative 1  0 - 1 %   Neutro Abs 5.1  1.7 - 7.7 K/uL   Lymphs Abs 0.3 (*) 0.7 - 4.0 K/uL   Monocytes Absolute 0.2  0.1 - 1.0 K/uL   Eosinophils Absolute 0.1  0.0 - 0.7 K/uL   Basophils Absolute 0.1  0.0 - 0.1 K/uL   WBC Morphology INCREASED BANDS (>20% BANDS)     Comment: MARKED LEFT SHIFT (>5% METAS,MYELOS AND PROS, OCC BLAST NOTED)  PROTIME-INR     Status: Abnormal   Collection Time    07/29/13  3:00 AM      Result Value Range   Prothrombin Time 15.4 (*) 11.6 - 15.2 seconds   INR 1.25  0.00 - 1.49  APTT  Status: Abnormal   Collection Time    07/29/13  3:00 AM      Result Value Range   aPTT 46 (*) 24 - 37  seconds   Comment:            IF BASELINE aPTT IS ELEVATED,     SUGGEST PATIENT RISK ASSESSMENT     BE USED TO DETERMINE APPROPRIATE     ANTICOAGULANT THERAPY.  COMPREHENSIVE METABOLIC PANEL     Status: Abnormal   Collection Time    07/29/13  3:00 AM      Result Value Range   Sodium 133 (*) 137 - 147 mEq/L   Potassium 3.5 (*) 3.7 - 5.3 mEq/L   Chloride 95 (*) 96 - 112 mEq/L   CO2 17 (*) 19 - 32 mEq/L   Glucose, Bld 110 (*) 70 - 99 mg/dL   BUN 48 (*) 6 - 23 mg/dL   Creatinine, Ser 1.61 (*) 0.50 - 1.35 mg/dL   Calcium 7.2 (*) 8.4 - 10.5 mg/dL   Total Protein 5.9 (*) 6.0 - 8.3 g/dL   Albumin 2.0 (*) 3.5 - 5.2 g/dL   AST 39 (*) 0 - 37 U/L   ALT 31  0 - 53 U/L   Alkaline Phosphatase 40  39 - 117 U/L   Total Bilirubin 1.5 (*) 0.3 - 1.2 mg/dL   GFR calc non Af Amer 20 (*) >90 mL/min   GFR calc Af Amer 24 (*) >90 mL/min   Comment: (NOTE)     The eGFR has been calculated using the CKD EPI equation.     This calculation has not been validated in all clinical situations.     eGFR's persistently <90 mL/min signify possible Chronic Kidney     Disease.  LIPASE, BLOOD     Status: Abnormal   Collection Time    07/29/13  3:00 AM      Result Value Range   Lipase 9 (*) 11 - 59 U/L  AMYLASE     Status: None   Collection Time    07/29/13  3:00 AM      Result Value Range   Amylase 12  0 - 105 U/L  CK     Status: Abnormal   Collection Time    07/29/13  3:00 AM      Result Value Range   Total CK 1859 (*) 7 - 232 U/L  BLOOD GAS, VENOUS     Status: Abnormal   Collection Time    07/29/13  4:55 AM      Result Value Range   FIO2 0.28     Delivery systems NASAL CANNULA     pH, Ven 7.286  7.250 - 7.300   pCO2, Ven 35.8 (*) 45.0 - 50.0 mmHg   pO2, Ven 56.6 (*) 30.0 - 45.0 mmHg   Bicarbonate 16.5 (*) 20.0 - 24.0 mEq/L   TCO2 17.6  0 - 100 mmol/L   Acid-base deficit 8.9 (*) 0.0 - 2.0 mmol/L   O2 Saturation 86.7     Patient temperature 98.6     Drawn by COLLECTED BY NURSE     Sample type  VENOUS    LACTIC ACID, PLASMA     Status: Abnormal   Collection Time    07/29/13  5:20 AM      Result Value Range   Lactic Acid, Venous 3.5 (*) 0.5 - 2.2 mmol/L    Ct Abdomen Pelvis Wo Contrast  07/29/2013   CLINICAL DATA:  Severe  pain in the right side. No injury. Redness and swelling of the skin with suspected cellulitis. Renal failure.  EXAM: CT CHEST WITHOUT CONTRAST; CT ABDOMEN AND PELVIS WITHOUT CONTRAST  TECHNIQUE: Multidetector CT imaging of the chest was performed following the standard protocol without IV contrast.; Multidetector CT imaging of the abdomen and pelvis was performed following the standard protocol without intravenous contrast.  COMPARISON:  None.  FINDINGS: CT chest: There is extensive infiltration in the subcutaneous fat and superficial muscle layers involving the right shoulder, right axilla, right lateral chest wall, right abdominal wall and right flank extending posteriorly to the level of the midline over the spinous process. Changes extend from the shoulder down to the right groin. This is consistent with extensive cellulitis, edema, and/or contusion. There is no discrete fluid collection that would suggest evidence of a focal abscess. Fluid and edema is demonstrated throughout the area and in between the muscle layers.  Normal heart size. Normal caliber thoracic aorta. No significant lymphadenopathy in the chest. Atelectasis in both lung bases. No pneumothorax or effusion. Esophagus is decompressed. Esophageal diverticulum may be present. Normal alignment of the thoracic spine. Sternum and ribs appear intact.  CT abdomen and pelvis: Right flank and abdominal wall changes as previously described above. Diffuse fatty infiltration of the liver. Multiple stones demonstrated in both kidneys with stones also demonstrated in the right renal pelvis. No evidence of pyelocaliectasis or ureterectasis. No ureteral stones are visualized. Small accessory spleen. The gallbladder  demonstrates somewhat diffuse increased density throughout, suggesting sludge or milk of calcium. The unenhanced appearance of the spleen, pancreas, adrenal glands, abdominal aorta, inferior vena cava, and retroperitoneal lymph nodes is unremarkable the stomach, small bowel, and colon are not abnormally distended. No free air or free fluid in the abdomen. No infiltration in the intra-abdominal fat.  Pelvis: Prostate gland is not enlarged. Bladder wall is not thickened. No free or loculated pelvic fluid collections. The appendix is normal. No evidence of diverticulitis. Mild degenerative changes in the lumbar spine with normal alignment. Pelvis, sacrum thumb and hips appear intact. Benign-appearing area of sclerosis in the right hemipelvis.  IMPRESSION: Extensive subcutaneous soft tissue and intramuscular cellulitis, infiltration/edema, or contusion along the right side of the chest and abdomen wall extending from the level of the shoulder to the pelvis. No discrete abscess is identified.   Electronically Signed   By: Burman Nieves M.D.   On: 07/29/2013 04:37   Ct Chest Wo Contrast  07/29/2013   CLINICAL DATA:  Severe pain in the right side. No injury. Redness and swelling of the skin with suspected cellulitis. Renal failure.  EXAM: CT CHEST WITHOUT CONTRAST; CT ABDOMEN AND PELVIS WITHOUT CONTRAST  TECHNIQUE: Multidetector CT imaging of the chest was performed following the standard protocol without IV contrast.; Multidetector CT imaging of the abdomen and pelvis was performed following the standard protocol without intravenous contrast.  COMPARISON:  None.  FINDINGS: CT chest: There is extensive infiltration in the subcutaneous fat and superficial muscle layers involving the right shoulder, right axilla, right lateral chest wall, right abdominal wall and right flank extending posteriorly to the level of the midline over the spinous process. Changes extend from the shoulder down to the right groin. This is  consistent with extensive cellulitis, edema, and/or contusion. There is no discrete fluid collection that would suggest evidence of a focal abscess. Fluid and edema is demonstrated throughout the area and in between the muscle layers.  Normal heart size. Normal caliber thoracic aorta. No significant  lymphadenopathy in the chest. Atelectasis in both lung bases. No pneumothorax or effusion. Esophagus is decompressed. Esophageal diverticulum may be present. Normal alignment of the thoracic spine. Sternum and ribs appear intact.  CT abdomen and pelvis: Right flank and abdominal wall changes as previously described above. Diffuse fatty infiltration of the liver. Multiple stones demonstrated in both kidneys with stones also demonstrated in the right renal pelvis. No evidence of pyelocaliectasis or ureterectasis. No ureteral stones are visualized. Small accessory spleen. The gallbladder demonstrates somewhat diffuse increased density throughout, suggesting sludge or milk of calcium. The unenhanced appearance of the spleen, pancreas, adrenal glands, abdominal aorta, inferior vena cava, and retroperitoneal lymph nodes is unremarkable the stomach, small bowel, and colon are not abnormally distended. No free air or free fluid in the abdomen. No infiltration in the intra-abdominal fat.  Pelvis: Prostate gland is not enlarged. Bladder wall is not thickened. No free or loculated pelvic fluid collections. The appendix is normal. No evidence of diverticulitis. Mild degenerative changes in the lumbar spine with normal alignment. Pelvis, sacrum thumb and hips appear intact. Benign-appearing area of sclerosis in the right hemipelvis.  IMPRESSION: Extensive subcutaneous soft tissue and intramuscular cellulitis, infiltration/edema, or contusion along the right side of the chest and abdomen wall extending from the level of the shoulder to the pelvis. No discrete abscess is identified.   Electronically Signed   By: Burman Nieves M.D.    On: 07/29/2013 04:37   Dg Chest Portable 1 View  07/28/2013   CLINICAL DATA:  Chest pain  EXAM: PORTABLE CHEST - 1 VIEW  COMPARISON:  None.  FINDINGS: The heart size and mediastinal contours are within normal limits. There is no focal infiltrate, pulmonary edema, or pleural effusion. The visualized skeletal structures are unremarkable.  IMPRESSION: No active disease.   Electronically Signed   By: Sherian Rein M.D.   On: 07/28/2013 19:37    Review of Systems  Constitutional: Positive for fever and chills. Negative for weight loss.       Decreased appetite past 2 days  Eyes: Positive for blurred vision.  Respiratory: Negative for shortness of breath and wheezing.   Cardiovascular: Negative for palpitations, orthopnea and leg swelling.  Gastrointestinal: Negative for nausea, vomiting and abdominal pain.  Genitourinary:       Decreased urination  Musculoskeletal:       Right sided chest wall pain down to groin  Skin: Positive for rash.  Neurological: Negative for sensory change, speech change, seizures and loss of consciousness.  Psychiatric/Behavioral: Negative for substance abuse.   Blood pressure 118/60, pulse 119, temperature 97.9 F (36.6 C), temperature source Oral, resp. rate 23, height 5\' 11"  (1.803 m), weight 215 lb 2.7 oz (97.6 kg), SpO2 97.00%. Physical Exam  Vitals reviewed. Constitutional: He is oriented to person, place, and time. He appears well-developed and well-nourished. No distress.  HENT:  Head: Normocephalic and atraumatic.  Right Ear: External ear normal.  Left Ear: External ear normal.  Eyes: Conjunctivae are normal. Pupils are equal, round, and reactive to light. No scleral icterus.  Neck: Normal range of motion. No tracheal deviation present. No thyromegaly present.  Cardiovascular: Normal heart sounds and intact distal pulses.  Tachycardia present.   Respiratory: Effort normal and breath sounds normal. No respiratory distress. He has no wheezes.       Right flank/axilla cellulitis extending to mid clavicular line back to paraspinous area. Most significant cellulitis is mid axillary line. Blanching erythema. +induration mainly along mid axillary line. No crepitus;  GI: Soft. He exhibits no distension. There is no tenderness.    Has a few pinpoint petechiae around umbilicus.   Neurological: He is alert and oriented to person, place, and time.  Skin: Skin is warm and dry. He is not diaphoretic.  Psychiatric: He has a normal mood and affect. His behavior is normal. Judgment and thought content normal.    Assessment/Plan: Tachycardia Hypotension Lactic acidosis Acute renal insufficiency Elevated CK Extensive right chest wall, axilla and lateral back cellulitis Left axillary cellulitis Diarrhea  I do not believe the patient has necrotizing fasciitis. There is no air within the soft tissue on radiological imaging. There is no crepitus on exam. While he does have significant cellulitis involving his right chest wall and a little bit of left axilla, I think this is more as a consequence of probable rhabdomyolysis. He has an elevated CK of around 2000, plus he has blood in his urine. With the constellation of laboratory findings and physical exam findings I think this is all consistent more with rhabdomyolysis. I recommend checking his urine for myoglobin. Renal consult. Consider CCM consult. At this time he does not require surgical debridement for his cellulitis. I would continue empiric antibiotics. Aggressive hydration. Discussed with the hospitalist on call  Mary Sella. Andrey Campanile, MD, FACS General, Bariatric, & Minimally Invasive Surgery [facility], [place]   [facility] M 07/29/2013, 7:29 AM

## 2013-07-29 NOTE — Care Management Note (Signed)
    Page 1 of 1   07/29/2013     8:17:24 AM   CARE MANAGEMENT NOTE 07/29/2013  Patient:  Andrew Hogan,Andrew Hogan   Account Number:  [id number]  Date Initiated:  07/29/2013  Documentation initiated by:  Junius Creamer  Subjective/Objective Assessment:   adm w uti     Action/Plan:   lives w fam   Anticipated DC Date:     Anticipated DC Plan:           Choice offered to / List presented to:             Status of service:   Medicare Important Message given?   (If response is "NO", the following Medicare IM given date fields will be blank) Date Medicare IM given:   Date Additional Medicare IM given:    Discharge Disposition:    Per UR Regulation:  Reviewed for med. necessity/level of care/duration of stay  If discussed at Long Length of Stay Meetings, dates discussed:    Comments:

## 2013-07-29 NOTE — Progress Notes (Signed)
[facility] Progress Note [place] TEAM 1 - Stepdown ICU Team    Andrew Hogan ZOX:[id number] DOB: [birth year] DOA: 07/28/2013 PCP: No primary provider on file.  Brief narrative: 42 year old otherwise healthy male. Presented to the ER for progressive right-sided chest and abdominal wall pain ongoing since the previous Wednesday. Patient endorsed on that same date he lifted a heavy box and developed this significant pain. The pain was easily treated initially with ibuprofen and left upper hydrocodone from his prior prescription. On the date of admission the pain had increased significantly over a less than 24-hour period and despite utilization of hot compresses and multiple doses of over-the-counter ibuprofen he sought treatment in the emergency department. In addition to the symptoms he had had multiple episodes of loose watery bowel movements without blood over the past 3 days a total of 10-12 stools per [place]. He denied any bowel movements on the date of presentation. In addition he has noticed a rash beginning under his left axilla and was itching and thought he was developing a rash in the periumbilical region. A limited echocardiogram was performed in the ER and showed no gross obvious abnormalities. There were concerns initially the patient may be experiencing pulmonary embolus. It was important to note that at presentation patient had tachypnea, was afebrile, was hypotensive with a blood pressure 75/34 and somewhat tachycardic. While in the emergency department he received a total of 3 L of normal saline and started on empiric antibiotic therapy. His blood pressure improved and his NAP was greater than 65 saline as deemed appropriate for non-ICU admission.  Since admission patient clarified to the RN that prior to onset of above symptoms he had noticed some knots or lymph nodes that were enlarged and tender in his axilla.  Assessment/Plan: Active Problems: Sepsis due to:   A) diffuse right side  myositis/cellulitis with left infra-axilla Cellulitis   B) Bacteremia due to Gram-positive bacteria -2/2 blood cx's positive for GPC in chains and prs (?strep) -CT chest and abdomen interestingly enough revealed significant soft tissue cellulitis without abscess formation and likely myositis involving the entire right side chest wall and abdominal wall without any associated cellulitic skin changes; the left infra-axillary cellulitic skin changes do not have any correlating soft tissue changes on the CT scan -Appreciate general surgery evaluation, findings not consistent with necrotizing fasciitis given lack of air on CT scan -BP soft but MAP greater than 65 so plan is to continue aggressive hydration -Continue empiric antibiotics with primary focus on gram-positive coverage but until urine culture results obtained would also continue gram-negative coverage -Procalcitonin elevated at 54.96 -Initial thrombocytopenia improving and most recent platelet count is 156,000  **Since earlier examined this morning patient is now complaining of increasing right groin pain extending into the scrotum with a sensation of fullness. Upon examination he is quite tender in this area but fortunately with minimal scrotal edema and noted without any penile drainage. Patient still having difficulty voiding so will have Foley catheter inserted by RN. **Discussed with on-call urologist Dr. Marcell Barlow recent results of CT scan noting no subcutaneous air and since he is still within the first 24 hour period of recent imaging it is doubtful that he has rapidly developed subcutaneous air at this point. He agrees that at this time no acute urosurgical emergency and to continue current treatment as we are doing including supportive care for sepsis and continuing broad-spectrum antibiotics. He did request that if the patient worsened that stat CT be repeated and that we  call him back for formal consultation.      Acute renal  failure/Metabolic acidosis/rhabdomyolysis/ATN -Suspect combination of dehydration from poor PO intake and severe diarrhea prior to admission, sepsis and use of NSAIDs pre-admission contributory to acute renal failure -renal fnx has worsened since admission with associated met acidosis which persists despite initial efforts at hydration -UOP low overnight so was given 500 cc bolus and IVFs were increased to 150/hr- despite this UOP only 250 cc since this am so add'l 1000 bolus given -will start Bicarb drip in conjunction with NS fluids -repeat BMET at 8 pm  Lactic acidosis - improving    Dehydration -as above    Grade 1 diastolic dysfunction -new finding this admission -monitor for respiratory failure with aggressive hydration     Chest wall pain -2/2 to cellulitis and myositis -Not consistent with cardiac/ischemic pain    Diarrhea -Due to sepsis and acute renal failure -Has not recurred since admit    Hypokalemia -Due to fluid shifting from dehydration and renal failure -IV replete when necessary   Fatty infiltration of liver  -Likely a chronic issue and does not appear contributory to current symptoms   DVT prophylaxis: Subcutaneous heparin Code Status: Full Family Communication: No family at bedside Disposition Plan/Expected LOS: Stepdown noting high risk to transition to ICU level of care Isolation: None Nutritional Status: Acute protein calorie malnutrition related to critical illness  Consultants: General surgery Urology (informal)  Procedures: 2-D echocardiogram - Left ventricle: The cavity size was normal. There was moderate concentric hypertrophy. Systolic function was normal. The estimated ejection fraction was in the range of 60% to 65%. Wall motion was normal; there were no regional wall motion abnormalities. Doppler parameters are consistent with abnormal left ventricular relaxation (grade 1 diastolic dysfunction). The E/e' ratio is <10, suggesting  normal LV filling pressure. - Left atrium: The atrium was normal in size. - Inferior vena cava: The vessel was normal in size; the respirophasic diameter changes were in the normal range (= 50%); findings are consistent with normal central venous pressure. - Pericardium, extracardiac: A trivial pericardial effusion was identified posterior to the heart.   Antibiotics: Zosyn  12/29 >>> Vancomycin  12/29 >>>  HPI/Subjective: Patient alert and endorses overall improvement in discomfort and generalized malaise. Does note increasing right groin and scrotum pain as compared to pre-admission. Increased itching to periumbilical region-patient leaving linear fingertip sized excoriated changes in skin from scratching.  Objective: Blood pressure 104/38, pulse 119, temperature 97.6 F (36.4 C), temperature source Oral, resp. rate 18, height 5\' 11"  (1.803 m), weight 215 lb 2.7 oz (97.6 kg), SpO2 99.00%.  Intake/Output Summary (Last 24 hours) at 07/29/13 1454 Last data filed at 07/29/13 1300  Gross per 24 hour  Intake   2817 ml  Output    500 ml  Net   2317 ml     Exam: Followup exam completed noting patient admitted at 5:05 AM today  Scheduled Meds:  Scheduled Meds: . heparin subcutaneous  5,000 Units Subcutaneous Q8H  . lidocaine  2 patch Transdermal Q24H  . piperacillin-tazobactam (ZOSYN)  IV  3.375 g Intravenous Q8H  . sodium chloride  1,000 mL Intravenous Once  . sodium chloride  3 mL Intravenous Q12H  . [START ON 07/31/2013] vancomycin  1,000 mg Intravenous Q48H   Continuous Infusions: .  sodium bicarbonate  infusion 1000 mL      **Reviewed in detail by the Attending Physician  Data Reviewed: Basic Metabolic Panel:  Recent Labs Lab 07/28/13 1911  07/29/13 0300 07/29/13 1215  NA 130* 133* 133*  K 3.8 3.5* 3.8  CL 87* 95* 95*  CO2 20 17* 13*  GLUCOSE 171* 110* 111*  BUN 44* 48* 56*  CREATININE 3.80* 3.42* 4.29*  CALCIUM 9.3 7.2* 7.2*   Liver Function  Tests:  Recent Labs Lab 07/28/13 1911 07/29/13 0300  AST 41* 39*  ALT 40 31  ALKPHOS 72 40  BILITOT 2.0* 1.5*  PROT 8.3 5.9*  ALBUMIN 3.2* 2.0*    Recent Labs Lab 07/29/13 0300  LIPASE 9*  AMYLASE 12   No results found for this basename: AMMONIA,  in the last 168 hours CBC:  Recent Labs Lab 07/28/13 1911 07/29/13 0205 07/29/13 0300 07/29/13 1215  WBC 12.7* 5.4 5.8 7.1  NEUTROABS 11.0*  --  5.1  --   HGB 16.6 16.2 15.9 14.9  HCT 46.8 45.9 44.4 41.7  MCV 85.9 86.6 86.2 86.0  PLT 179 129* 133* 156   Cardiac Enzymes:  Recent Labs Lab 07/28/13 1911 07/29/13 0300  CKTOTAL  --  1859*  TROPONINI <0.30  --    BNP (last 3 results)  Recent Labs  07/28/13 1911  PROBNP 500.6*   CBG:  Recent Labs Lab 07/28/13 2005  GLUCAP 149*    Recent Results (from the past 240 hour(s))  CULTURE, BLOOD (ROUTINE X 2)     Status: None   Collection Time    07/28/13  7:05 PM      Result Value Range Status   Specimen Description BLOOD RIGHT ANTECUBITAL   Final   Special Requests NONE BOTTLES DRAWN AEROBIC AND ANAEROBIC 5CC EACH   Final   Culture  Setup Time     Final   Value: 07/29/2013 01:25     Performed at [facility]   Culture     Final   Value: GRAM POSITIVE COCCI IN PAIRS AND CHAINS     Note: Gram Stain Report Called to,Read Back By and Verified With: Charlsie Merles 07/29/13 1405 BY SMITHERSJ     Performed at [facility]   Report Status PENDING   Incomplete  CULTURE, BLOOD (ROUTINE X 2)     Status: None   Collection Time    07/28/13  7:25 PM      Result Value Range Status   Specimen Description BLOOD LEFT ANTECUBITAL   Final   Special Requests NONE BOTTLES DRAWN AEROBIC AND ANAEROBIC 10CC EACH   Final   Culture  Setup Time     Final   Value: 07/29/2013 01:25     Performed at [facility]   Culture     Final   Value: GRAM POSITIVE COCCI IN PAIRS AND CHAINS     Note: Gram Stain Report Called to,Read Back By and Verified With: Charlsie Merles 07/29/13 1405 BY SMITHERSJ     Performed at [facility]   Report Status PENDING   Incomplete     Studies:  Recent x-ray studies have been reviewed in detail by the Attending Physician     Junious Silk, ANP [facility] Office  [phone] Pager [phone]  **If unable to reach the above provider after paging please contact the Flow Manager @ [phone]  On-Call/Text Page:      [email or web address]      password [email or web address]  If 7PM-7AM, please contact night-coverage [email or web address] Password [email or web address] 07/29/2013, 2:54 PM   LOS: 1 [place]   I have examined the patient, reviewed the chart and modified the above  note which I agree with.   Jemar Paulsen,MD [phone] 07/29/2013, 6:46 PM

## 2013-07-29 NOTE — H&P (Signed)
[facility] History and Physical  Patient: Andrew Hogan  ZOX:[id number]  DOB: 1970-12-29  DOS: the patient was seen and examined on 07/29/2013 PCP: No primary provider on file.  Chief Complaint: Right-sided pain  HPI: Andrew Hogan is a 42 y.o. male with no significant past medical history. The patient is coming from home. The patient is coming in with complaints of right-sided pain that has been ongoing since last Wednesday. He mentions that on Wednesday he was lifting a heavy box which was filled with cartridges. And then he started having pain on the right side of the back. The pain was initially tolerable and he took an ibuprofen and a hydrocodone from his prior prescription. But since then the pain progressively got worse and today the pain was significantly worse and it was not getting better despite taking hot compresses and 6 ibuprofen over-the-counter and a dose of hydrocodone. Therefore he came to the ED. He felt feverish but did not have any chills. No complaint of nausea or vomiting. No abdominal pain. No leg swelling or recent travel or trauma or immobilization. No leg tenderness. No history of similar symptoms in the past. He had 15 episode of loose watery bowel movement without any blood since last 3 days on a daily basis. No bowel movement today. He denies any similar sick contacts. He started noticing the rash on his left upper extremity as well as umbilical region.  Review of Systems: as mentioned in the history of present illness.  A Comprehensive review of the other systems is negative.  History reviewed. No pertinent past medical history. History reviewed. No pertinent past surgical history. Social History:  reports that he has never smoked. He does not have any smokeless tobacco history on file. He reports that he does not drink alcohol or use illicit drugs. Independent for most of his  ADL.  No Known Allergies  No family history on file.  Prior to Admission medications    Not on File    Physical Exam: Filed Vitals:   07/29/13 0000 07/29/13 0100 07/29/13 0200 07/29/13 0347  BP: 95/55 94/67 114/53   Pulse: 109 113 118   Temp:    97.9 F (36.6 C)  TempSrc:    Oral  Resp: 23 31 19    Height:      Weight:      SpO2: 97% 99% 96%     General: Alert, Awake and Oriented to Time, Place and Person. Appear in marked distress Eyes: PERRL ENT: Oral Mucosa clear dry. Neck: no JVD Cardiovascular: S1 and S2 Present, no Murmur, Peripheral Pulses Present Respiratory: Bilateral Air entry equal and Decreased, Clear to Auscultation,  no Crackles,no wheezes Abdomen: Bowel Sound Present, Soft and non tender Skin: Marked edema with tenderness and redness and warmth located on the right side starting from axilla to the groin, the diffuse patch of erythematous Rash located on the left upper chest. Both blanchable. Diffuse red spots around the umbilicus. Extremities: No Pedal edema, no calf tenderness Neurologic: Grossly Unremarkable.  Labs on Admission:  CBC:  Recent Labs Lab 07/28/13 1911 07/29/13 0205 07/29/13 0300  WBC 12.7* 5.4 5.8  NEUTROABS 11.0*  --  5.1  HGB 16.6 16.2 15.9  HCT 46.8 45.9 44.4  MCV 85.9 86.6 86.2  PLT 179 129* 133*    CMP     Component Value Date/Time   NA 133* 07/29/2013 0300   K 3.5* 07/29/2013 0300   CL 95* 07/29/2013 0300   CO2 17* 07/29/2013 0300  GLUCOSE 110* 07/29/2013 0300   BUN 48* 07/29/2013 0300   CREATININE 3.42* 07/29/2013 0300   CALCIUM 7.2* 07/29/2013 0300   PROT 5.9* 07/29/2013 0300   ALBUMIN 2.0* 07/29/2013 0300   AST 39* 07/29/2013 0300   ALT 31 07/29/2013 0300   ALKPHOS 40 07/29/2013 0300   BILITOT 1.5* 07/29/2013 0300   GFRNONAA 20* 07/29/2013 0300   GFRAA 24* 07/29/2013 0300     Recent Labs Lab 07/29/13 0300  LIPASE 9*  AMYLASE 12   No results found for this basename: AMMONIA,  in the last 168 hours   Recent Labs Lab 07/28/13 1911  TROPONINI <0.30   BNP (last 3 results)  Recent  Labs  07/28/13 1911  PROBNP 500.6*    Radiological Exams on Admission: Ct Abdomen Pelvis Wo Contrast  07/29/2013   CLINICAL DATA:  Severe pain in the right side. No injury. Redness and swelling of the skin with suspected cellulitis. Renal failure.  EXAM: CT CHEST WITHOUT CONTRAST; CT ABDOMEN AND PELVIS WITHOUT CONTRAST  TECHNIQUE: Multidetector CT imaging of the chest was performed following the standard protocol without IV contrast.; Multidetector CT imaging of the abdomen and pelvis was performed following the standard protocol without intravenous contrast.  COMPARISON:  None.  FINDINGS: CT chest: There is extensive infiltration in the subcutaneous fat and superficial muscle layers involving the right shoulder, right axilla, right lateral chest wall, right abdominal wall and right flank extending posteriorly to the level of the midline over the spinous process. Changes extend from the shoulder down to the right groin. This is consistent with extensive cellulitis, edema, and/or contusion. There is no discrete fluid collection that would suggest evidence of a focal abscess. Fluid and edema is demonstrated throughout the area and in between the muscle layers.  Normal heart size. Normal caliber thoracic aorta. No significant lymphadenopathy in the chest. Atelectasis in both lung bases. No pneumothorax or effusion. Esophagus is decompressed. Esophageal diverticulum may be present. Normal alignment of the thoracic spine. Sternum and ribs appear intact.  CT abdomen and pelvis: Right flank and abdominal wall changes as previously described above. Diffuse fatty infiltration of the liver. Multiple stones demonstrated in both kidneys with stones also demonstrated in the right renal pelvis. No evidence of pyelocaliectasis or ureterectasis. No ureteral stones are visualized. Small accessory spleen. The gallbladder demonstrates somewhat diffuse increased density throughout, suggesting sludge or milk of calcium. The  unenhanced appearance of the spleen, pancreas, adrenal glands, abdominal aorta, inferior vena cava, and retroperitoneal lymph nodes is unremarkable the stomach, small bowel, and colon are not abnormally distended. No free air or free fluid in the abdomen. No infiltration in the intra-abdominal fat.  Pelvis: Prostate gland is not enlarged. Bladder wall is not thickened. No free or loculated pelvic fluid collections. The appendix is normal. No evidence of diverticulitis. Mild degenerative changes in the lumbar spine with normal alignment. Pelvis, sacrum thumb and hips appear intact. Benign-appearing area of sclerosis in the right hemipelvis.  IMPRESSION: Extensive subcutaneous soft tissue and intramuscular cellulitis, infiltration/edema, or contusion along the right side of the chest and abdomen wall extending from the level of the shoulder to the pelvis. No discrete abscess is identified.   Electronically Signed   By: Burman Nieves M.D.   On: 07/29/2013 04:37   Ct Chest Wo Contrast  07/29/2013   CLINICAL DATA:  Severe pain in the right side. No injury. Redness and swelling of the skin with suspected cellulitis. Renal failure.  EXAM: CT CHEST WITHOUT CONTRAST;  CT ABDOMEN AND PELVIS WITHOUT CONTRAST  TECHNIQUE: Multidetector CT imaging of the chest was performed following the standard protocol without IV contrast.; Multidetector CT imaging of the abdomen and pelvis was performed following the standard protocol without intravenous contrast.  COMPARISON:  None.  FINDINGS: CT chest: There is extensive infiltration in the subcutaneous fat and superficial muscle layers involving the right shoulder, right axilla, right lateral chest wall, right abdominal wall and right flank extending posteriorly to the level of the midline over the spinous process. Changes extend from the shoulder down to the right groin. This is consistent with extensive cellulitis, edema, and/or contusion. There is no discrete fluid collection that  would suggest evidence of a focal abscess. Fluid and edema is demonstrated throughout the area and in between the muscle layers.  Normal heart size. Normal caliber thoracic aorta. No significant lymphadenopathy in the chest. Atelectasis in both lung bases. No pneumothorax or effusion. Esophagus is decompressed. Esophageal diverticulum may be present. Normal alignment of the thoracic spine. Sternum and ribs appear intact.  CT abdomen and pelvis: Right flank and abdominal wall changes as previously described above. Diffuse fatty infiltration of the liver. Multiple stones demonstrated in both kidneys with stones also demonstrated in the right renal pelvis. No evidence of pyelocaliectasis or ureterectasis. No ureteral stones are visualized. Small accessory spleen. The gallbladder demonstrates somewhat diffuse increased density throughout, suggesting sludge or milk of calcium. The unenhanced appearance of the spleen, pancreas, adrenal glands, abdominal aorta, inferior vena cava, and retroperitoneal lymph nodes is unremarkable the stomach, small bowel, and colon are not abnormally distended. No free air or free fluid in the abdomen. No infiltration in the intra-abdominal fat.  Pelvis: Prostate gland is not enlarged. Bladder wall is not thickened. No free or loculated pelvic fluid collections. The appendix is normal. No evidence of diverticulitis. Mild degenerative changes in the lumbar spine with normal alignment. Pelvis, sacrum thumb and hips appear intact. Benign-appearing area of sclerosis in the right hemipelvis.  IMPRESSION: Extensive subcutaneous soft tissue and intramuscular cellulitis, infiltration/edema, or contusion along the right side of the chest and abdomen wall extending from the level of the shoulder to the pelvis. No discrete abscess is identified.   Electronically Signed   By: Burman Nieves M.D.   On: 07/29/2013 04:37   Dg Chest Portable 1 View  07/28/2013   CLINICAL DATA:  Chest pain  EXAM:  PORTABLE CHEST - 1 VIEW  COMPARISON:  None.  FINDINGS: The heart size and mediastinal contours are within normal limits. There is no focal infiltrate, pulmonary edema, or pleural effusion. The visualized skeletal structures are unremarkable.  IMPRESSION: No active disease.   Electronically Signed   By: Sherian Rein M.D.   On: 07/28/2013 19:37    EKG: Independently reviewed. sinus tachycardia.  Assessment/Plan Principal Problem:   Cellulitis Active Problems:   Chest pain   Rash and nonspecific skin eruption   Acute kidney injury   Diarrhea   1. Cellulitis The patient is presenting with pain on the right region. His CT scan of the chest and abdomen is suggestive of extensive cellulitis as well as intramuscular inflammation. Patient will be given IV vancomycin and Zosyn. He has received 4 L of IV fluid bolus I will give an extra albumin bolus I would also continue him on IV fluids For pain I would continue him on IV Dilaudid There is a high chance that this could also come out of a necrotizing fasciitis for which I have consulted Gen. surgery  who will see the patient  2. Chest pain Patient initially presented to the facility with a complaint of right-sided pain which was thought as a chest pain with pleuritic in nature and was started on empiric heparin due to elevated d-dimer But currently it appears that he has extensive cellulitis of the local area and the d-dimer elevation could also be secondary to acute kidney injury Also he is not significantly hypoxic or tachypneic And on my evaluation he denies any paretic component of chest pain or calf tenderness or recent immobilization Therefore his feet is probablity for PE is very less likely, therefore I will hold heparin. This was discussed with the patient who also agrees with the plan. I will get a stat VQ scan to rule out PE. We'll also get a Doppler of the lower extremity as well as echocardiogram.  3. Acute kidney injury Likely  prerenal as well as injury due to ATN and injury from nsaids Continue IV hydration May require nephrology consultation  4. Rash on the left upper chest area Likely due to cellulitis Continue IV antibiotics  Consults: General surgery  DVT Prophylaxis: subcutaneous Heparin Nutrition: N.p.o.  Code Status: Full  Disposition: Admitted to inpatient in step-down unit.  Author: Lynden Oxford, MD [facility] Pager: [phone] 07/29/2013, 5:05 AM    If 7PM-7AM, please contact night-coverage [email or web address] Password [email or web address]

## 2013-07-30 ENCOUNTER — Inpatient Hospital Stay (HOSPITAL_COMMUNITY): Payer: BC Managed Care – PPO

## 2013-07-30 DIAGNOSIS — N3090 Cystitis, unspecified without hematuria: Secondary | ICD-10-CM

## 2013-07-30 DIAGNOSIS — B950 Streptococcus, group A, as the cause of diseases classified elsewhere: Secondary | ICD-10-CM

## 2013-07-30 DIAGNOSIS — IMO0001 Reserved for inherently not codable concepts without codable children: Secondary | ICD-10-CM

## 2013-07-30 LAB — COMPREHENSIVE METABOLIC PANEL
ALT: 39 U/L (ref 0–53)
AST: 50 U/L — ABNORMAL HIGH (ref 0–37)
Albumin: 1.6 g/dL — ABNORMAL LOW (ref 3.5–5.2)
Alkaline Phosphatase: 48 U/L (ref 39–117)
CO2: 20 mEq/L (ref 19–32)
Calcium: 6.8 mg/dL — ABNORMAL LOW (ref 8.4–10.5)
Glucose, Bld: 121 mg/dL — ABNORMAL HIGH (ref 70–99)
Potassium: 3.2 mEq/L — ABNORMAL LOW (ref 3.7–5.3)
Sodium: 130 mEq/L — ABNORMAL LOW (ref 137–147)
Total Protein: 5 g/dL — ABNORMAL LOW (ref 6.0–8.3)

## 2013-07-30 LAB — CBC
Hemoglobin: 12.8 g/dL — ABNORMAL LOW (ref 13.0–17.0)
MCH: 30.5 pg (ref 26.0–34.0)
MCHC: 36.6 g/dL — ABNORMAL HIGH (ref 30.0–36.0)
Platelets: 136 10*3/uL — ABNORMAL LOW (ref 150–400)
RBC: 4.2 MIL/uL — ABNORMAL LOW (ref 4.22–5.81)
WBC: 15.5 10*3/uL — ABNORMAL HIGH (ref 4.0–10.5)

## 2013-07-30 LAB — URINE CULTURE: Culture: NO GROWTH

## 2013-07-30 MED ORDER — LACTULOSE 10 GM/15ML PO SOLN
10.0000 g | Freq: Once | ORAL | Status: AC
  Administered 2013-07-30: 10 g via ORAL
  Filled 2013-07-30: qty 15

## 2013-07-30 MED ORDER — POTASSIUM CHLORIDE CRYS ER 20 MEQ PO TBCR
40.0000 meq | EXTENDED_RELEASE_TABLET | Freq: Once | ORAL | Status: AC
  Administered 2013-07-30: 40 meq via ORAL
  Filled 2013-07-30: qty 2

## 2013-07-30 MED ORDER — CLINDAMYCIN PHOSPHATE 600 MG/50ML IV SOLN
600.0000 mg | Freq: Three times a day (TID) | INTRAVENOUS | Status: DC
  Administered 2013-07-30 – 2013-08-04 (×15): 600 mg via INTRAVENOUS
  Filled 2013-07-30 (×19): qty 50

## 2013-07-30 MED ORDER — POLYETHYLENE GLYCOL 3350 17 G PO PACK
17.0000 g | PACK | Freq: Two times a day (BID) | ORAL | Status: DC
  Administered 2013-07-30 – 2013-08-02 (×3): 17 g via ORAL
  Filled 2013-07-30 (×10): qty 1

## 2013-07-30 MED ORDER — PENICILLIN G POTASSIUM 5000000 UNITS IJ SOLR
4.0000 10*6.[IU] | INTRAVENOUS | Status: AC
  Administered 2013-07-30: 4 10*6.[IU] via INTRAVENOUS
  Filled 2013-07-30 (×3): qty 4

## 2013-07-30 MED ORDER — DOCUSATE SODIUM 100 MG PO CAPS
100.0000 mg | ORAL_CAPSULE | Freq: Two times a day (BID) | ORAL | Status: DC
  Administered 2013-07-30: 100 mg via ORAL
  Filled 2013-07-30 (×8): qty 1

## 2013-07-30 MED ORDER — PENICILLIN G POTASSIUM 5000000 UNITS IJ SOLR
4.0000 10*6.[IU] | Freq: Three times a day (TID) | INTRAVENOUS | Status: DC
  Administered 2013-07-30 – 2013-08-01 (×5): 4 10*6.[IU] via INTRAVENOUS
  Filled 2013-07-30 (×7): qty 4

## 2013-07-30 MED ORDER — BISACODYL 10 MG RE SUPP: 10.0000 mg | Freq: Every day | RECTAL | Status: DC | PRN

## 2013-07-30 MED ORDER — LACTULOSE 10 GM/15ML PO SOLN
10.0000 g | Freq: Two times a day (BID) | ORAL | Status: DC | PRN
  Filled 2013-07-30: qty 15

## 2013-07-30 NOTE — Consult Note (Signed)
42 year old male with no significant PMH. Presented to the ER for progressive right-sided chest and abdominal wall pain ongoing since the previous Friday. Patient stated on that same date he lifted a heavy box and developed pain approximately 1 hr after lifting. No other known injury.  The patient described the pain as "gout-like" with itching/pain.   He noticed "blue bumps" in his R axilla on Sunday night.  The patient initially use hydrocodone that he had when he previously strained his back and supplemented this with ibuprofen.  Patient states that he took 4 doses on ibuprofen 200 on Saturday, 4 on Sunday, and then 4 x2 on Monday.  On the date of admission the pain had increased significantly over a less than 24-hour period and despite utilization of hot compresses and multiple doses of over-the-counter ibuprofen he sought treatment in the emergency department. The patient also described increased loose stools prior to admission (states usually has 4 stools/[place] and he was going more frequently prior with loose stools to admission).  Patient states he also did not eat anything since Saturday.   Patient was tachypnic, afebrile, hypotensive, tachycardic on admission.  Received 3 L NS in ED.  Was found to be septic from Group A Strep bacteremia (x2) and cellulitis/myositis (CT 12/30 with extensive soft tissue/intramuscular cellulitis, infiltration/edema, or contusion along right side of chest/abdomen wall extending from shoulder to pelvis).  Initially started on vanc/zosyn, switched to PCN and clinda per ID.  Cr on admission was 3.8 and has increased to 5.15 today with decreasing UOP so Renal was consulted.  Of note, patient has a cat (but notes no recent scratches to affected area).  Recent travel in Aug to [place].    History reviewed. No pertinent past medical history. History reviewed. No pertinent past surgical history. Social History:  reports that he has never smoked. He does not have any smokeless  tobacco history on file. He reports that he does not drink alcohol or use illicit drugs. Allergies: No Known Allergies No family history on file.  Medications:  Prior to Admission:  Prescriptions prior to admission  Medication Sig Dispense Refill  . HYDROCODONE-ACETAMINOPHEN PO Take 1 tablet by mouth every 8 (eight) hours as needed (for pain).      Marland Kitchen ibuprofen (ADVIL,MOTRIN) 200 MG tablet Take 800 mg by mouth every 8 (eight) hours as needed for mild pain.       Scheduled: . clindamycin (CLEOCIN) IV  600 mg Intravenous Q8H  . docusate sodium  100 mg Oral BID  . heparin subcutaneous  5,000 Units Subcutaneous Q8H  . lidocaine  2 patch Transdermal Q24H  . pencillin G potassium IV  4 Million Units Intravenous Q8H  . polyethylene glycol  17 g Oral BID  . sodium chloride  3 mL Intravenous Q12H    ROS: Endorses increased loose stools, nausea, orthostasis.   Blood pressure 116/68, pulse 122, temperature 98.3 F (36.8 C), temperature source Oral, resp. rate 27, height 5\' 11"  (1.803 m), weight 106.3 kg (234 lb 5.6 oz), SpO2 92.00%.  General appearance: alert, cooperative and flushed Head: Normocephalic, without obvious abnormality, atraumatic Eyes: negative Nose: no discharge Resp: clear to auscultation bilaterally Chest wall: right sided chest wall tenderness Cardio: tachycardic, regular, no mgr GI: R side abdomen tender Extremities: edema trace Skin: blanching erythema in R axilla/side Neurologic: Grossly normal Patient with soreness on R chest/axilla/flank/abdomen with induration, blanching erythema.  Scrotal edema  Results for orders placed during the hospital encounter of 07/28/13 (from the past 48  hour(s))  CULTURE, BLOOD (ROUTINE X 2)     Status: None   Collection Time    07/28/13  7:05 PM      Result Value Range   Specimen Description BLOOD RIGHT ANTECUBITAL     Special Requests NONE BOTTLES DRAWN AEROBIC AND ANAEROBIC 5CC EACH     Culture  Setup Time       Value:  07/29/2013 01:25     Performed at [facility]   Culture       Value: GROUP A STREP (S.PYOGENES) ISOLATED     Note: Gram Stain Report Called to,Read Back By and Verified With: Charlsie Merles 07/29/13 1405 BY SMITHERSJ     Performed at [facility]   Report Status PENDING    CBC WITH DIFFERENTIAL     Status: Abnormal   Collection Time    07/28/13  7:11 PM      Result Value Range   WBC 12.7 (*) 4.0 - 10.5 K/uL   RBC 5.45  4.22 - 5.81 MIL/uL   Hemoglobin 16.6  13.0 - 17.0 g/dL   HCT 16.1  09.6 - 04.5 %   MCV 85.9  78.0 - 100.0 fL   MCH 30.5  26.0 - 34.0 pg   MCHC 35.5  30.0 - 36.0 g/dL   RDW 40.9  81.1 - 91.4 %   Platelets 179  150 - 400 K/uL   Neutrophils Relative % 82 (*) 43 - 77 %   Lymphocytes Relative 5 (*) 12 - 46 %   Monocytes Relative 6  3 - 12 %   Eosinophils Relative 2  0 - 5 %   Basophils Relative 0  0 - 1 %   Band Neutrophils 5  0 - 10 %   Neutro Abs 11.0 (*) 1.7 - 7.7 K/uL   Lymphs Abs 0.6 (*) 0.7 - 4.0 K/uL   Monocytes Absolute 0.8  0.1 - 1.0 K/uL   Eosinophils Absolute 0.3  0.0 - 0.7 K/uL   Basophils Absolute 0.0  0.0 - 0.1 K/uL  COMPREHENSIVE METABOLIC PANEL     Status: Abnormal   Collection Time    07/28/13  7:11 PM      Result Value Range   Sodium 130 (*) 135 - 145 mEq/L   Potassium 3.8  3.5 - 5.1 mEq/L   Chloride 87 (*) 96 - 112 mEq/L   CO2 20  19 - 32 mEq/L   Glucose, Bld 171 (*) 70 - 99 mg/dL   BUN 44 (*) 6 - 23 mg/dL   Creatinine, Ser 7.82 (*) 0.50 - 1.35 mg/dL   Calcium 9.3  8.4 - 95.6 mg/dL   Total Protein 8.3  6.0 - 8.3 g/dL   Albumin 3.2 (*) 3.5 - 5.2 g/dL   AST 41 (*) 0 - 37 U/L   ALT 40  0 - 53 U/L   Alkaline Phosphatase 72  39 - 117 U/L   Total Bilirubin 2.0 (*) 0.3 - 1.2 mg/dL   GFR calc non Af Amer 18 (*) >90 mL/min   GFR calc Af Amer 21 (*) >90 mL/min   Comment: (NOTE)     The eGFR has been calculated using the CKD EPI equation.     This calculation has not been validated in all clinical situations.     eGFR's  persistently <90 mL/min signify possible Chronic Kidney     Disease.  PROTIME-INR     Status: None   Collection Time  07/28/13  7:11 PM      Result Value Range   Prothrombin Time 14.0  11.6 - 15.2 seconds   INR 1.10  0.00 - 1.49  TROPONIN I     Status: None   Collection Time    07/28/13  7:11 PM      Result Value Range   Troponin I <0.30  <0.30 ng/mL   Comment:            Due to the release kinetics of cTnI,     a negative result within the first hours     of the onset of symptoms does not rule out     myocardial infarction with certainty.     If myocardial infarction is still suspected,     repeat the test at appropriate intervals.  D-DIMER, QUANTITATIVE     Status: Abnormal   Collection Time    07/28/13  7:11 PM      Result Value Range   D-Dimer, Quant 3.75 (*) 0.00 - 0.48 ug/mL-FEU   Comment:            AT THE INHOUSE ESTABLISHED CUTOFF     VALUE OF 0.48 ug/mL FEU,     THIS ASSAY HAS BEEN DOCUMENTED     IN THE LITERATURE TO HAVE     A SENSITIVITY AND NEGATIVE     PREDICTIVE VALUE OF AT LEAST     98 TO 99%.  THE TEST RESULT     SHOULD BE CORRELATED WITH     AN ASSESSMENT OF THE CLINICAL     PROBABILITY OF DVT / VTE.  PRO B NATRIURETIC PEPTIDE     Status: Abnormal   Collection Time    07/28/13  7:11 PM      Result Value Range   Pro B Natriuretic peptide (BNP) 500.6 (*) 0 - 125 pg/mL  CG4 I-STAT (LACTIC ACID)     Status: Abnormal   Collection Time    07/28/13  7:18 PM      Result Value Range   Lactic Acid, Venous 6.21 (*) 0.5 - 2.2 mmol/L  CULTURE, BLOOD (ROUTINE X 2)     Status: None   Collection Time    07/28/13  7:25 PM      Result Value Range   Specimen Description BLOOD LEFT ANTECUBITAL     Special Requests NONE BOTTLES DRAWN AEROBIC AND ANAEROBIC 10CC EACH     Culture  Setup Time       Value: 07/29/2013 01:25     Performed at [facility]   Culture       Value: GROUP A STREP (S.PYOGENES) ISOLATED     Note: Gram Stain Report Called to,Read Back  By and Verified With: Charlsie Merles 07/29/13 1405 BY SMITHERSJ     Performed at [facility]   Report Status PENDING    GLUCOSE, CAPILLARY     Status: Abnormal   Collection Time    07/28/13  8:05 PM      Result Value Range   Glucose-Capillary 149 (*) 70 - 99 mg/dL   Comment 1 NOTIFY MD    URINALYSIS, ROUTINE W REFLEX MICROSCOPIC     Status: Abnormal   Collection Time    07/29/13 12:43 AM      Result Value Range   Color, Urine ORANGE (*) YELLOW   Comment: BIOCHEMICALS MAY BE AFFECTED BY COLOR   APPearance TURBID (*) CLEAR   Specific Gravity, Urine 1.025  1.005 - 1.030  pH 5.0  5.0 - 8.0   Glucose, UA 100 (*) NEGATIVE mg/dL   Hgb urine dipstick LARGE (*) NEGATIVE   Bilirubin Urine SMALL (*) NEGATIVE   Ketones, ur 15 (*) NEGATIVE mg/dL   Protein, ur 454 (*) NEGATIVE mg/dL   Urobilinogen, UA 1.0  0.0 - 1.0 mg/dL   Nitrite NEGATIVE  NEGATIVE   Leukocytes, UA MODERATE (*) NEGATIVE  URINE CULTURE     Status: None   Collection Time    07/29/13 12:43 AM      Result Value Range   Specimen Description URINE, RANDOM     Special Requests NONE     Culture  Setup Time       Value: 07/29/2013 08:59     Performed at [facility] Count       Value: NO GROWTH     Performed at [facility]   Culture       Value: NO GROWTH     Performed at [facility]   Report Status 07/30/2013 FINAL    URINE MICROSCOPIC-ADD ON     Status: Abnormal   Collection Time    07/29/13 12:43 AM      Result Value Range   Squamous Epithelial / LPF FEW (*) RARE   WBC, UA 11-20  <3 WBC/hpf   RBC / HPF 11-20  <3 RBC/hpf   Bacteria, UA MANY (*) RARE   Casts GRANULAR CAST (*) NEGATIVE  CBC     Status: Abnormal   Collection Time    07/29/13  2:05 AM      Result Value Range   WBC 5.4  4.0 - 10.5 K/uL   RBC 5.30  4.22 - 5.81 MIL/uL   Hemoglobin 16.2  13.0 - 17.0 g/dL   HCT 09.8  11.9 - 14.7 %   MCV 86.6  78.0 - 100.0 fL   MCH 30.6  26.0 - 34.0 pg   MCHC 35.3  30.0 -  36.0 g/dL   RDW 82.9  56.2 - 13.0 %   Platelets 129 (*) 150 - 400 K/uL  CBC WITH DIFFERENTIAL     Status: Abnormal   Collection Time    07/29/13  3:00 AM      Result Value Range   WBC 5.8  4.0 - 10.5 K/uL   RBC 5.15  4.22 - 5.81 MIL/uL   Hemoglobin 15.9  13.0 - 17.0 g/dL   HCT 86.5  78.4 - 69.6 %   MCV 86.2  78.0 - 100.0 fL   MCH 30.9  26.0 - 34.0 pg   MCHC 35.8  30.0 - 36.0 g/dL   RDW 29.5  28.4 - 13.2 %   Platelets 133 (*) 150 - 400 K/uL   Neutrophils Relative % 89 (*) 43 - 77 %   Lymphocytes Relative 6 (*) 12 - 46 %   Monocytes Relative 3  3 - 12 %   Eosinophils Relative 1  0 - 5 %   Basophils Relative 1  0 - 1 %   Neutro Abs 5.1  1.7 - 7.7 K/uL   Lymphs Abs 0.3 (*) 0.7 - 4.0 K/uL   Monocytes Absolute 0.2  0.1 - 1.0 K/uL   Eosinophils Absolute 0.1  0.0 - 0.7 K/uL   Basophils Absolute 0.1  0.0 - 0.1 K/uL   WBC Morphology INCREASED BANDS (>20% BANDS)     Comment: MARKED LEFT SHIFT (>5% METAS,MYELOS AND PROS, OCC BLAST NOTED)  PROTIME-INR  Status: Abnormal   Collection Time    07/29/13  3:00 AM      Result Value Range   Prothrombin Time 15.4 (*) 11.6 - 15.2 seconds   INR 1.25  0.00 - 1.49  APTT     Status: Abnormal   Collection Time    07/29/13  3:00 AM      Result Value Range   aPTT 46 (*) 24 - 37 seconds   Comment:            IF BASELINE aPTT IS ELEVATED,     SUGGEST PATIENT RISK ASSESSMENT     BE USED TO DETERMINE APPROPRIATE     ANTICOAGULANT THERAPY.  COMPREHENSIVE METABOLIC PANEL     Status: Abnormal   Collection Time    07/29/13  3:00 AM      Result Value Range   Sodium 133 (*) 137 - 147 mEq/L   Potassium 3.5 (*) 3.7 - 5.3 mEq/L   Chloride 95 (*) 96 - 112 mEq/L   CO2 17 (*) 19 - 32 mEq/L   Glucose, Bld 110 (*) 70 - 99 mg/dL   BUN 48 (*) 6 - 23 mg/dL   Creatinine, Ser 1.61 (*) 0.50 - 1.35 mg/dL   Calcium 7.2 (*) 8.4 - 10.5 mg/dL   Total Protein 5.9 (*) 6.0 - 8.3 g/dL   Albumin 2.0 (*) 3.5 - 5.2 g/dL   AST 39 (*) 0 - 37 U/L   ALT 31  0 - 53 U/L    Alkaline Phosphatase 40  39 - 117 U/L   Total Bilirubin 1.5 (*) 0.3 - 1.2 mg/dL   GFR calc non Af Amer 20 (*) >90 mL/min   GFR calc Af Amer 24 (*) >90 mL/min   Comment: (NOTE)     The eGFR has been calculated using the CKD EPI equation.     This calculation has not been validated in all clinical situations.     eGFR's persistently <90 mL/min signify possible Chronic Kidney     Disease.  LIPASE, BLOOD     Status: Abnormal   Collection Time    07/29/13  3:00 AM      Result Value Range   Lipase 9 (*) 11 - 59 U/L  AMYLASE     Status: None   Collection Time    07/29/13  3:00 AM      Result Value Range   Amylase 12  0 - 105 U/L  CK     Status: Abnormal   Collection Time    07/29/13  3:00 AM      Result Value Range   Total CK 1859 (*) 7 - 232 U/L  BLOOD GAS, VENOUS     Status: Abnormal   Collection Time    07/29/13  4:55 AM      Result Value Range   FIO2 0.28     Delivery systems NASAL CANNULA     pH, Ven 7.286  7.250 - 7.300   pCO2, Ven 35.8 (*) 45.0 - 50.0 mmHg   pO2, Ven 56.6 (*) 30.0 - 45.0 mmHg   Bicarbonate 16.5 (*) 20.0 - 24.0 mEq/L   TCO2 17.6  0 - 100 mmol/L   Acid-base deficit 8.9 (*) 0.0 - 2.0 mmol/L   O2 Saturation 86.7     Patient temperature 98.6     Drawn by COLLECTED BY NURSE     Sample type VENOUS    LACTIC ACID, PLASMA     Status: Abnormal  Collection Time    07/29/13  5:20 AM      Result Value Range   Lactic Acid, Venous 3.5 (*) 0.5 - 2.2 mmol/L  PROCALCITONIN     Status: None   Collection Time    07/29/13 12:15 PM      Result Value Range   Procalcitonin 54.96     Comment:            Interpretation:     PCT >= 10 ng/mL:     Important systemic inflammatory response,     almost exclusively due to severe bacterial     sepsis or septic shock.     (NOTE)             ICU PCT Algorithm               Non ICU PCT Algorithm        ----------------------------     ------------------------------             PCT < 0.25 ng/mL                 PCT < 0.1 ng/mL          Stopping of antibiotics            Stopping of antibiotics           strongly encouraged.               strongly encouraged.        ----------------------------     ------------------------------           PCT level decrease by               PCT < 0.25 ng/mL           >= 80% from peak PCT           OR PCT 0.25 - 0.5 ng/mL          Stopping of antibiotics                                                 encouraged.         Stopping of antibiotics               encouraged.        ----------------------------     ------------------------------           PCT level decrease by              PCT >= 0.25 ng/mL           < 80% from peak PCT            AND PCT >= 0.5 ng/mL            Continuing antibiotics                                                  encouraged.           Continuing antibiotics                encouraged.        ----------------------------     ------------------------------  PCT level increase compared          PCT > 0.5 ng/mL             with peak PCT AND              PCT >= 0.5 ng/mL             Escalation of antibiotics                                              strongly encouraged.          Escalation of antibiotics            strongly encouraged.  BASIC METABOLIC PANEL     Status: Abnormal   Collection Time    07/29/13 12:15 PM      Result Value Range   Sodium 133 (*) 137 - 147 mEq/L   Comment: Please note change in reference range.   Potassium 3.8  3.7 - 5.3 mEq/L   Comment: Please note change in reference range.   Chloride 95 (*) 96 - 112 mEq/L   CO2 13 (*) 19 - 32 mEq/L   Glucose, Bld 111 (*) 70 - 99 mg/dL   BUN 56 (*) 6 - 23 mg/dL   Creatinine, Ser 4.54 (*) 0.50 - 1.35 mg/dL   Calcium 7.2 (*) 8.4 - 10.5 mg/dL   GFR calc non Af Amer 15 (*) >90 mL/min   GFR calc Af Amer 18 (*) >90 mL/min   Comment: (NOTE)     The eGFR has been calculated using the CKD EPI equation.     This calculation has not been validated in all clinical situations.      eGFR's persistently <90 mL/min signify possible Chronic Kidney     Disease.  CBC     Status: None   Collection Time    07/29/13 12:15 PM      Result Value Range   WBC 7.1  4.0 - 10.5 K/uL   RBC 4.85  4.22 - 5.81 MIL/uL   Hemoglobin 14.9  13.0 - 17.0 g/dL   HCT 09.8  11.9 - 14.7 %   MCV 86.0  78.0 - 100.0 fL   MCH 30.7  26.0 - 34.0 pg   MCHC 35.7  30.0 - 36.0 g/dL   RDW 82.9  56.2 - 13.0 %   Platelets 156  150 - 400 K/uL  BASIC METABOLIC PANEL     Status: Abnormal   Collection Time    07/29/13  8:57 PM      Result Value Range   Sodium 130 (*) 137 - 147 mEq/L   Comment: Please note change in reference range.   Potassium 3.4 (*) 3.7 - 5.3 mEq/L   Comment: Please note change in reference range.   Chloride 92 (*) 96 - 112 mEq/L   CO2 16 (*) 19 - 32 mEq/L   Glucose, Bld 192 (*) 70 - 99 mg/dL   BUN 64 (*) 6 - 23 mg/dL   Creatinine, Ser 8.65 (*) 0.50 - 1.35 mg/dL   Calcium 6.6 (*) 8.4 - 10.5 mg/dL   GFR calc non Af Amer 14 (*) >90 mL/min   GFR calc Af Amer 16 (*) >90 mL/min   Comment: (NOTE)     The eGFR has been calculated using the CKD EPI equation.     This  calculation has not been validated in all clinical situations.     eGFR's persistently <90 mL/min signify possible Chronic Kidney     Disease.  CBC     Status: Abnormal   Collection Time    07/30/13  4:46 AM      Result Value Range   WBC 15.5 (*) 4.0 - 10.5 K/uL   RBC 4.20 (*) 4.22 - 5.81 MIL/uL   Hemoglobin 12.8 (*) 13.0 - 17.0 g/dL   HCT 91.4 (*) 78.2 - 95.6 %   MCV 83.3  78.0 - 100.0 fL   MCH 30.5  26.0 - 34.0 pg   MCHC 36.6 (*) 30.0 - 36.0 g/dL   RDW 21.3  08.6 - 57.8 %   Platelets 136 (*) 150 - 400 K/uL  COMPREHENSIVE METABOLIC PANEL     Status: Abnormal   Collection Time    07/30/13  4:46 AM      Result Value Range   Sodium 130 (*) 137 - 147 mEq/L   Comment: Please note change in reference range.   Potassium 3.2 (*) 3.7 - 5.3 mEq/L   Comment: Please note change in reference range.   Chloride 89 (*) 96 -  112 mEq/L   CO2 20  19 - 32 mEq/L   Glucose, Bld 121 (*) 70 - 99 mg/dL   BUN 69 (*) 6 - 23 mg/dL   Creatinine, Ser 4.69 (*) 0.50 - 1.35 mg/dL   Calcium 6.8 (*) 8.4 - 10.5 mg/dL   Total Protein 5.0 (*) 6.0 - 8.3 g/dL   Albumin 1.6 (*) 3.5 - 5.2 g/dL   AST 50 (*) 0 - 37 U/L   ALT 39  0 - 53 U/L   Alkaline Phosphatase 48  39 - 117 U/L   Total Bilirubin 1.7 (*) 0.3 - 1.2 mg/dL   GFR calc non Af Amer 12 (*) >90 mL/min   GFR calc Af Amer 14 (*) >90 mL/min   Comment: (NOTE)     The eGFR has been calculated using the CKD EPI equation.     This calculation has not been validated in all clinical situations.     eGFR's persistently <90 mL/min signify possible Chronic Kidney     Disease.  CK     Status: Abnormal   Collection Time    07/30/13  4:46 AM      Result Value Range   Total CK 1565 (*) 7 - 232 U/L  SODIUM, URINE, RANDOM     Status: None   Collection Time    07/30/13  8:24 AM      Result Value Range   Sodium, Ur 20     Ct Abdomen Pelvis Wo Contrast  07/29/2013   CLINICAL DATA:  Severe pain in the right side. No injury. Redness and swelling of the skin with suspected cellulitis. Renal failure.  EXAM: CT CHEST WITHOUT CONTRAST; CT ABDOMEN AND PELVIS WITHOUT CONTRAST  TECHNIQUE: Multidetector CT imaging of the chest was performed following the standard protocol without IV contrast.; Multidetector CT imaging of the abdomen and pelvis was performed following the standard protocol without intravenous contrast.  COMPARISON:  None.  FINDINGS: CT chest: There is extensive infiltration in the subcutaneous fat and superficial muscle layers involving the right shoulder, right axilla, right lateral chest wall, right abdominal wall and right flank extending posteriorly to the level of the midline over the spinous process. Changes extend from the shoulder down to the right groin. This is consistent with extensive cellulitis, edema,  and/or contusion. There is no discrete fluid collection that would  suggest evidence of a focal abscess. Fluid and edema is demonstrated throughout the area and in between the muscle layers.  Normal heart size. Normal caliber thoracic aorta. No significant lymphadenopathy in the chest. Atelectasis in both lung bases. No pneumothorax or effusion. Esophagus is decompressed. Esophageal diverticulum may be present. Normal alignment of the thoracic spine. Sternum and ribs appear intact.  CT abdomen and pelvis: Right flank and abdominal wall changes as previously described above. Diffuse fatty infiltration of the liver. Multiple stones demonstrated in both kidneys with stones also demonstrated in the right renal pelvis. No evidence of pyelocaliectasis or ureterectasis. No ureteral stones are visualized. Small accessory spleen. The gallbladder demonstrates somewhat diffuse increased density throughout, suggesting sludge or milk of calcium. The unenhanced appearance of the spleen, pancreas, adrenal glands, abdominal aorta, inferior vena cava, and retroperitoneal lymph nodes is unremarkable the stomach, small bowel, and colon are not abnormally distended. No free air or free fluid in the abdomen. No infiltration in the intra-abdominal fat.  Pelvis: Prostate gland is not enlarged. Bladder wall is not thickened. No free or loculated pelvic fluid collections. The appendix is normal. No evidence of diverticulitis. Mild degenerative changes in the lumbar spine with normal alignment. Pelvis, sacrum thumb and hips appear intact. Benign-appearing area of sclerosis in the right hemipelvis.  IMPRESSION: Extensive subcutaneous soft tissue and intramuscular cellulitis, infiltration/edema, or contusion along the right side of the chest and abdomen wall extending from the level of the shoulder to the pelvis. No discrete abscess is identified.   Electronically Signed   By: Burman Nieves M.D.   On: 07/29/2013 04:37   Dg Chest 2 View  07/30/2013   CLINICAL DATA:  Pain.  EXAM: CHEST  2 VIEW   COMPARISON:  Chest x-ray 07/30/2013.  Chest CT 07/29/2013.  FINDINGS: Prominent bibasilar atelectasis and/or pneumonia noted. No pneumothorax. Stable cardiomegaly, no pulmonary venous distention or prominent pleural effusion. Mild pleural thickening present. No acute osseous abnormality. Degenerative changes thoracic spine.  IMPRESSION: Dense bibasilar atelectasis and/or infiltrates.   Electronically Signed   By: Maisie Fus  Register   On: 07/30/2013 14:09   Dg Abd 1 View  07/30/2013   CLINICAL DATA:  Pain.  EXAM: ABDOMEN - 1 VIEW  COMPARISON:  CT 07/29/2013.  FINDINGS: As noted on CT diffuse right side secondary soft tissue swelling is present. Air-filled nondilated loops of small and large bowel noted. This is a nonspecific finding. Bilateral nephrolithiasis. No acute bony abnormality. Sclerotic density noted in the right ilium most consistent with bone island. No other sclerotic bony densities noted to suggest metastatic disease.  IMPRESSION: 1. Soft tissue swelling of the right abdomen subcutaneous tissue, reference made to recent CT report. 2. Bilateral nephrolithiasis. 3. Nonspecific nondistended air-filled loops of small large bowel noted. 4. Sclerotic density node in the right iliac wing consistent with bone island .   Electronically Signed   By: Maisie Fus  Register   On: 07/30/2013 13:33   Ct Chest Wo Contrast  07/29/2013   CLINICAL DATA:  Severe pain in the right side. No injury. Redness and swelling of the skin with suspected cellulitis. Renal failure.  EXAM: CT CHEST WITHOUT CONTRAST; CT ABDOMEN AND PELVIS WITHOUT CONTRAST  TECHNIQUE: Multidetector CT imaging of the chest was performed following the standard protocol without IV contrast.; Multidetector CT imaging of the abdomen and pelvis was performed following the standard protocol without intravenous contrast.  COMPARISON:  None.  FINDINGS: CT chest:  There is extensive infiltration in the subcutaneous fat and superficial muscle layers involving the  right shoulder, right axilla, right lateral chest wall, right abdominal wall and right flank extending posteriorly to the level of the midline over the spinous process. Changes extend from the shoulder down to the right groin. This is consistent with extensive cellulitis, edema, and/or contusion. There is no discrete fluid collection that would suggest evidence of a focal abscess. Fluid and edema is demonstrated throughout the area and in between the muscle layers.  Normal heart size. Normal caliber thoracic aorta. No significant lymphadenopathy in the chest. Atelectasis in both lung bases. No pneumothorax or effusion. Esophagus is decompressed. Esophageal diverticulum may be present. Normal alignment of the thoracic spine. Sternum and ribs appear intact.  CT abdomen and pelvis: Right flank and abdominal wall changes as previously described above. Diffuse fatty infiltration of the liver. Multiple stones demonstrated in both kidneys with stones also demonstrated in the right renal pelvis. No evidence of pyelocaliectasis or ureterectasis. No ureteral stones are visualized. Small accessory spleen. The gallbladder demonstrates somewhat diffuse increased density throughout, suggesting sludge or milk of calcium. The unenhanced appearance of the spleen, pancreas, adrenal glands, abdominal aorta, inferior vena cava, and retroperitoneal lymph nodes is unremarkable the stomach, small bowel, and colon are not abnormally distended. No free air or free fluid in the abdomen. No infiltration in the intra-abdominal fat.  Pelvis: Prostate gland is not enlarged. Bladder wall is not thickened. No free or loculated pelvic fluid collections. The appendix is normal. No evidence of diverticulitis. Mild degenerative changes in the lumbar spine with normal alignment. Pelvis, sacrum thumb and hips appear intact. Benign-appearing area of sclerosis in the right hemipelvis.  IMPRESSION: Extensive subcutaneous soft tissue and intramuscular  cellulitis, infiltration/edema, or contusion along the right side of the chest and abdomen wall extending from the level of the shoulder to the pelvis. No discrete abscess is identified.   Electronically Signed   By: Burman Nieves M.D.   On: 07/29/2013 04:37   Dg Chest Port 1 View  07/30/2013   CLINICAL DATA:  Shortness of breath  EXAM: PORTABLE CHEST - 1 VIEW  COMPARISON:  07/28/2013  FINDINGS: The heart size and mediastinal contours are within normal limits. Both lungs are clear. The visualized skeletal structures are unremarkable.  IMPRESSION: No active disease.   Electronically Signed   By: Alcide Clever M.D.   On: 07/30/2013 08:13   Dg Chest Portable 1 View  07/28/2013   CLINICAL DATA:  Chest pain  EXAM: PORTABLE CHEST - 1 VIEW  COMPARISON:  None.  FINDINGS: The heart size and mediastinal contours are within normal limits. There is no focal infiltrate, pulmonary edema, or pleural effusion. The visualized skeletal structures are unremarkable.  IMPRESSION: No active disease.   Electronically Signed   By: Sherian Rein M.D.   On: 07/28/2013 19:37    Assessment: 1 Oliguric AKI patient with rising creatinine from 3.8 on admission to 5.15 today (baseline Cr unknown), likely 2/2 septic shock with possible contribution from NSAID use.  Rhabdo less likely.  2 Septic shock, due to Group A strep bacteremia and cellulitis/myositis.  Per ID, patient on penicillin/clindamycin.  WBC increasing (15.5).  Lactate trending down (6.3->3.5) 3 Metabolic Acidosis (AG), patient on sodium bicarbonate 75 ml/hr IVF, improving 4 hypokalemia 5 hyponatremia 6 hypoalbuminemia  Plan: 1 Renal US 2 Continue intravenous hydration and electrolyte replacement; and antibiotics 3 Dialysis if indicated  Tyna Huertas C 07/30/2013, 3:31 PM

## 2013-07-30 NOTE — Progress Notes (Signed)
Subjective: PT states he is feeling better today and is having less pain.  He is able to move his upper body easier with less pain.  Having a hard time having a BM.  Objective: Vital signs in last 24 hours: Temp:  [97.5 F (36.4 C)-98.9 F (37.2 C)] 98 F (36.7 C) (12/31 0400) Pulse Rate:  [108-129] 126 (12/31 0522) Resp:  [18-32] 32 (12/31 0522) BP: (104-140)/(38-71) 118/59 mmHg (12/31 0400) SpO2:  [95 %-99 %] 96 % (12/31 0522) Weight:  [234 lb 5.6 oz (106.3 kg)] 234 lb 5.6 oz (106.3 kg) (12/31 0522) Last BM Date: 07/28/13  Intake/Output from previous [place]: 12/30 0701 - 12/31 0700 In: 5545 [P.O.:1620; I.V.:3300; IV Piggyback:625] Out: 905 [Urine:905] Intake/Output this shift:    General appearance: alert and cooperative Chest wall: no tenderness, Right sided erythema decreased, no ttp Abd wall: Less erythema and min ttp  Lab Results:   Recent Labs  07/29/13 1215 07/30/13 0446  WBC 7.1 15.5*  HGB 14.9 12.8*  HCT 41.7 35.0*  PLT 156 136*   BMET  Recent Labs  07/29/13 2057 07/30/13 0446  NA 130* 130*  K 3.4* 3.2*  CL 92* 89*  CO2 16* 20  GLUCOSE 192* 121*  BUN 64* 69*  CREATININE 4.72* 5.15*  CALCIUM 6.6* 6.8*   PT/INR  Recent Labs  07/28/13 1911 07/29/13 0300  LABPROT 14.0 15.4*  INR 1.10 1.25   ABG  Recent Labs  07/29/13 0455  HCO3 16.5*    Studies/Results: Ct Abdomen Pelvis Wo Contrast  07/29/2013   CLINICAL DATA:  Severe pain in the right side. No injury. Redness and swelling of the skin with suspected cellulitis. Renal failure.  EXAM: CT CHEST WITHOUT CONTRAST; CT ABDOMEN AND PELVIS WITHOUT CONTRAST  TECHNIQUE: Multidetector CT imaging of the chest was performed following the standard protocol without IV contrast.; Multidetector CT imaging of the abdomen and pelvis was performed following the standard protocol without intravenous contrast.  COMPARISON:  None.  FINDINGS: CT chest: There is extensive infiltration in the subcutaneous fat and  superficial muscle layers involving the right shoulder, right axilla, right lateral chest wall, right abdominal wall and right flank extending posteriorly to the level of the midline over the spinous process. Changes extend from the shoulder down to the right groin. This is consistent with extensive cellulitis, edema, and/or contusion. There is no discrete fluid collection that would suggest evidence of a focal abscess. Fluid and edema is demonstrated throughout the area and in between the muscle layers.  Normal heart size. Normal caliber thoracic aorta. No significant lymphadenopathy in the chest. Atelectasis in both lung bases. No pneumothorax or effusion. Esophagus is decompressed. Esophageal diverticulum may be present. Normal alignment of the thoracic spine. Sternum and ribs appear intact.  CT abdomen and pelvis: Right flank and abdominal wall changes as previously described above. Diffuse fatty infiltration of the liver. Multiple stones demonstrated in both kidneys with stones also demonstrated in the right renal pelvis. No evidence of pyelocaliectasis or ureterectasis. No ureteral stones are visualized. Small accessory spleen. The gallbladder demonstrates somewhat diffuse increased density throughout, suggesting sludge or milk of calcium. The unenhanced appearance of the spleen, pancreas, adrenal glands, abdominal aorta, inferior vena cava, and retroperitoneal lymph nodes is unremarkable the stomach, small bowel, and colon are not abnormally distended. No free air or free fluid in the abdomen. No infiltration in the intra-abdominal fat.  Pelvis: Prostate gland is not enlarged. Bladder wall is not thickened. No free or loculated pelvic fluid  collections. The appendix is normal. No evidence of diverticulitis. Mild degenerative changes in the lumbar spine with normal alignment. Pelvis, sacrum thumb and hips appear intact. Benign-appearing area of sclerosis in the right hemipelvis.  IMPRESSION: Extensive  subcutaneous soft tissue and intramuscular cellulitis, infiltration/edema, or contusion along the right side of the chest and abdomen wall extending from the level of the shoulder to the pelvis. No discrete abscess is identified.   Electronically Signed   By: Burman Nieves M.D.   On: 07/29/2013 04:37   Ct Chest Wo Contrast  07/29/2013   CLINICAL DATA:  Severe pain in the right side. No injury. Redness and swelling of the skin with suspected cellulitis. Renal failure.  EXAM: CT CHEST WITHOUT CONTRAST; CT ABDOMEN AND PELVIS WITHOUT CONTRAST  TECHNIQUE: Multidetector CT imaging of the chest was performed following the standard protocol without IV contrast.; Multidetector CT imaging of the abdomen and pelvis was performed following the standard protocol without intravenous contrast.  COMPARISON:  None.  FINDINGS: CT chest: There is extensive infiltration in the subcutaneous fat and superficial muscle layers involving the right shoulder, right axilla, right lateral chest wall, right abdominal wall and right flank extending posteriorly to the level of the midline over the spinous process. Changes extend from the shoulder down to the right groin. This is consistent with extensive cellulitis, edema, and/or contusion. There is no discrete fluid collection that would suggest evidence of a focal abscess. Fluid and edema is demonstrated throughout the area and in between the muscle layers.  Normal heart size. Normal caliber thoracic aorta. No significant lymphadenopathy in the chest. Atelectasis in both lung bases. No pneumothorax or effusion. Esophagus is decompressed. Esophageal diverticulum may be present. Normal alignment of the thoracic spine. Sternum and ribs appear intact.  CT abdomen and pelvis: Right flank and abdominal wall changes as previously described above. Diffuse fatty infiltration of the liver. Multiple stones demonstrated in both kidneys with stones also demonstrated in the right renal pelvis. No  evidence of pyelocaliectasis or ureterectasis. No ureteral stones are visualized. Small accessory spleen. The gallbladder demonstrates somewhat diffuse increased density throughout, suggesting sludge or milk of calcium. The unenhanced appearance of the spleen, pancreas, adrenal glands, abdominal aorta, inferior vena cava, and retroperitoneal lymph nodes is unremarkable the stomach, small bowel, and colon are not abnormally distended. No free air or free fluid in the abdomen. No infiltration in the intra-abdominal fat.  Pelvis: Prostate gland is not enlarged. Bladder wall is not thickened. No free or loculated pelvic fluid collections. The appendix is normal. No evidence of diverticulitis. Mild degenerative changes in the lumbar spine with normal alignment. Pelvis, sacrum thumb and hips appear intact. Benign-appearing area of sclerosis in the right hemipelvis.  IMPRESSION: Extensive subcutaneous soft tissue and intramuscular cellulitis, infiltration/edema, or contusion along the right side of the chest and abdomen wall extending from the level of the shoulder to the pelvis. No discrete abscess is identified.   Electronically Signed   By: Burman Nieves M.D.   On: 07/29/2013 04:37   Dg Chest Portable 1 View  07/28/2013   CLINICAL DATA:  Chest pain  EXAM: PORTABLE CHEST - 1 VIEW  COMPARISON:  None.  FINDINGS: The heart size and mediastinal contours are within normal limits. There is no focal infiltrate, pulmonary edema, or pleural effusion. The visualized skeletal structures are unremarkable.  IMPRESSION: No active disease.   Electronically Signed   By: Sherian Rein M.D.   On: 07/28/2013 19:37    Anti-infectives: Anti-infectives  Start     Dose/Rate Route Frequency Ordered Stop   07/31/13 0600  vancomycin (VANCOCIN) IVPB 1000 mg/200 mL premix     1,000 mg 200 mL/hr over 60 Minutes Intravenous Every 48 hours 07/29/13 0305     07/29/13 0400  vancomycin (VANCOCIN) IVPB 1000 mg/200 mL premix     1,000  mg 200 mL/hr over 60 Minutes Intravenous  Once 07/29/13 0305 07/29/13 0752   07/29/13 0400  piperacillin-tazobactam (ZOSYN) IVPB 3.375 g     3.375 g 12.5 mL/hr over 240 Minutes Intravenous 3 times per [place]atz 07/29/13 0305     07/28/13 1945  vancomycin (VANCOCIN) IVPB 1000 mg/200 mL premix     1,000 mg 200 mL/hr over 60 Minutes Intravenous  Once 07/28/13 1934 07/28/13 2105   07/28/13 1945  piperacillin-tazobactam (ZOSYN) IVPB 3.375 g     3.375 g 12.5 mL/hr over 240 Minutes Intravenous  Once 07/28/13 1934 07/28/13 2033      Assessment/Plan: 42 y/o M with cellutlitis to right side of body, ? Rhabdo, ? Myoglobinuria  1. con't with abx 2. Will add colace for bowel regimen 3. Would rec urine myoglobin labs to check for myoglobin in urine and con't with diruesis 4. No surgical plans at this time.   LOS: 2 days    Marigene Ehlers., [facility] 07/30/2013

## 2013-07-30 NOTE — Consult Note (Addendum)
[facility]     Reason for Consult: GAS bacteremia    Referring Physician: Dr. Sharon Seller  Active Problems:   Chest wall pain   Cellulitis   Acute renal failure   Diarrhea   Bacteremia due to Gram-positive bacteria   Sepsis   Dehydration   Metabolic acidosis   Hypokalemia   Rhabdomyolysis   Fatty infiltration of liver   . clindamycin (CLEOCIN) IV  600 mg Intravenous Q8H  . docusate sodium  100 mg Oral BID  . heparin subcutaneous  5,000 Units Subcutaneous Q8H  . lidocaine  2 patch Transdermal Q24H  . pencillin G potassium IV  4 Million Units Intravenous Q4H  . polyethylene glycol  17 g Oral BID  . sodium chloride  3 mL Intravenous Q12H    Recommendations: Clindamycin and penicillin IV CXR and KUB for ? gas Frequent exams   Assessment: He has GAS, cellulitis, myositis.  CT scan did not suggest any necrotizing process.   Has erythema, but no significant tenderness.  It extends along the torso, to abdomen but no specific location that is worse.  I discussed with surgery and we will do an xray for ? Gas for any localizing area to evaluate.    Antibiotics: Vancomycin and zosyn  HPI: Andrew Hogan is a 42 y.o. male with no significant pmh who presented with right sided chest wall pain since Wednesday.  He had lifted a box and it did bump him and noted pain after that, thought to be muscle strain.  OTC therapy and oxycodone from previous back strain did not help.  He did develop watery diarrhea, though currently is constipated.  He was hypotensive in the ED but responded to fluid boluses.  His WBC has increased to 15,000, he remains tachycardic but pain is stable.   CT personally reviewed, discussed with surgery.    Review of Systems: A comprehensive review of systems was negative.  History reviewed. No pertinent past medical history.  History  Substance Use Topics  . Smoking status: Never Smoker   . Smokeless tobacco: Not on file  . Alcohol Use: No      No family history on file. No Known Allergies  OBJECTIVE: Blood pressure 114/60, pulse 104, temperature 98.3 F (36.8 C), temperature source Oral, resp. rate 22, height 5\' 11"  (1.803 m), weight 234 lb 5.6 oz (106.3 kg), SpO2 96.00%. General: Awake, alert, appears in mild distress Skin: fine erythema over right torso to about hip, no significant tenderness, + edema, pitting Lungs: CTA B Cor: tachy rr without m Abdomen: soft, nt, nd Ext: no edema  Microbiology: Recent Results (from the past 240 hour(s))  CULTURE, BLOOD (ROUTINE X 2)     Status: None   Collection Time    07/28/13  7:05 PM      Result Value Range Status   Specimen Description BLOOD RIGHT ANTECUBITAL   Final   Special Requests NONE BOTTLES DRAWN AEROBIC AND ANAEROBIC [facility] EACH   Final   Culture  Setup Time     Final   Value: 07/29/2013 01:25     Performed at [facility]   Culture     Final   Value: GROUP A STREP (S.PYOGENES) ISOLATED     Note: Gram Stain Report Called to,Read Back By and Verified With: Charlsie Merles 07/29/13 1405 BY [email or web address]     Performed at [facility]   Report Status PENDING   Incomplete  CULTURE, BLOOD (ROUTINE X  2)     Status: None   Collection Time    07/28/13  7:25 PM      Result Value Range Status   Specimen Description BLOOD LEFT ANTECUBITAL   Final   Special Requests NONE BOTTLES DRAWN AEROBIC AND ANAEROBIC 10CC EACH   Final   Culture  Setup Time     Final   Value: 07/29/2013 01:25     Performed at [facility]   Culture     Final   Value: GROUP A STREP (S.PYOGENES) ISOLATED     Note: Gram Stain Report Called to,Read Back By and Verified With: Charlsie Merles 07/29/13 1405 BY [email or web address]     Performed at [facility]   Report Status PENDING   Incomplete  URINE CULTURE     Status: None   Collection Time    07/29/13 12:43 AM      Result Value Range Status   Specimen Description URINE, RANDOM   Final   Special Requests NONE   Final   Culture   Setup Time     Final   Value: 07/29/2013 08:59     Performed at [facility]   Colony Count     Final   Value: NO GROWTH     Performed at [facility]   Culture     Final   Value: NO GROWTH     Performed at [facility]   Report Status 07/30/2013 FINAL   Final    Staci Righter, MD [email or web address] [phone] pager  [phone] cell 07/30/2013, 11:44 AM

## 2013-07-30 NOTE — Progress Notes (Signed)
[facility] Progress Note [place] TEAM 1 - Stepdown ICU Team   Andrew Hogan ZOX:[id number] DOB: 1970/09/07 DOA: 07/28/2013 PCP: No primary provider on file.  Brief narrative: 42 year old otherwise healthy male. Presented to the ER for progressive right-sided chest and abdominal wall pain ongoing since the previous Wednesday. Patient endorsed on that same date he lifted a heavy box and developed this significant pain. The pain was easily treated initially with ibuprofen and left over hydrocodone from a prior prescription. On the date of admission the pain had increased significantly over a less than 24-hour period and despite utilization of hot compresses and multiple doses of over-the-counter ibuprofen he sought treatment in the emergency department. In addition to the symptoms he had had multiple episodes of loose watery bowel movements without blood over the past 3 days a total of 10-12 stools per Dante. He denied any bowel movements on the date of presentation. In addition he has noticed a rash beginning under his left axilla and was itching and thought he was developing a rash in the periumbilical region. A limited echocardiogram was performed in the ER and showed no gross obvious abnormalities. There were concerns initially the patient may be experiencing pulmonary embolus. It was important to note that at presentation patient had tachypnea, was afebrile, was hypotensive with a blood pressure 75/34 and somewhat tachycardic. While in the emergency department he received a total of 3 L of normal saline and started on empiric antibiotic therapy. His blood pressure improved and his NAP was greater than 65 saline as deemed appropriate for non-ICU admission.  Since admission patient clarified to the RN that prior to onset of above symptoms he had noticed some knots or lymph nodes that were enlarged and tender in his axilla.  HPI/Subjective: Patient alert and states he is actually feeling a bit better in  general - primarily concerned over lack of BM for past 2 days. Still with significant right side pain.  Assessment/Plan:  Sepsis due to:   A) diffuse right side fasciitis/myositis/cellulitis with left infra-axilla cellulitis   B) Bacteremia due to strep A -2 of 2 blood cx's positive for strep A -CT chest and abdomen revealed extensive fasciitis on right side -Appreciate general surgery evaluation, findings not consistent with necrotizing fasciitis given lack of air on CT scan -ID consulted and anbx's changed to clindamycin and PCN -Procalcitonin elevated at 54.96 -Initial thrombocytopenia had improved but trend is back down to 130s -Discussed with on-call urologist Dr. Kathrynn Running on 12/30 due to concern of descending infection and risk for Fornier's gangrene - discussed CT results and PE findings/changes. He agreed that no acute urosurgical emergency and to continue current treatment as we are doing including supportive care for sepsis and continuing broad-spectrum antibiotics. He did request that if the patient worsened that stat CT be repeated and that we call him back for formal consultation.      Acute renal failure/Metabolic acidosis/mysositis/ATN -Suspect etiology combination of dehydration from poor PO intake and severe diarrhea prior to admission, sepsis/bacterial toxins, and use of NSAIDs pre-admission  -renal fnx has worsened since admission with associated met acidosis which persists despite initial efforts at hydration and now with increasing edema and marginal UOP so Renal consulted -cont Bicarb drip but decrease rate to 75/hr -a urine myoglobin level with not contribute to his care in any meaningful way    Grade 1 diastolic dysfunction -new finding this admission -monitor for respiratory failure with aggressive hydration -now on O2 but CXR stable  Chest wall pain -2/2 to cellulitis and myositis -Not consistent with cardiac/ischemic pain    Diarrhea >> constipation -Due to  sepsis and acute renal failure -Has not recurred since admit and pt now c/o constipation- CT did show some stool burden in right colon so will start Colace and prn Lactulose    Hypokalemia -Due to fluid shifting from dehydration and renal failure -IV replete when necessary   Fatty infiltration of liver  -Likely a chronic issue and does not appear contributory to current symptoms  DVT prophylaxis: SQ heparin Code Status: Full Family Communication: discussed w/ pt and wife at bedside Disposition Plan/Expected LOS: Stepdown noting high risk to transition to ICU level of care  Consultants: General surgery Urology (informal) Infectious Disease Nephrology  Procedures: 2-D echocardiogram - Left ventricle: The cavity size was normal. There was moderate concentric hypertrophy. Systolic function was normal. The estimated ejection fraction was in the range of 60% to 65%. Wall motion was normal; there were no regional wall motion abnormalities. Doppler parameters are consistent with abnormal left ventricular relaxation (grade 1 diastolic dysfunction). The E/e' ratio is <10, suggesting normal LV filling pressure. - Left atrium: The atrium was normal in size. - Inferior vena cava: The vessel was normal in size; the respirophasic diameter changes were in the normal range (= 50%); findings are consistent with normal central venous pressure. - Pericardium, extracardiac: A trivial pericardial effusion was identified posterior to the heart.  Antibiotics: Zosyn  12/29 >>> 12/31 Vancomycin  12/29 >>> 12/31 PCN 12/31 >>> Clindamycin 12/31 >>>  Objective: Blood pressure 116/68, pulse 122, temperature 98.3 F (36.8 C), temperature source Oral, resp. rate 27, height 5\' 11"  (1.803 m), weight 234 lb 5.6 oz (106.3 kg), SpO2 92.00%.  Intake/Output Summary (Last 24 hours) at 07/30/13 1236 Last data filed at 07/30/13 1200  Gross per 24 hour  Intake   5465 ml  Output    405 ml  Net   5060 ml    Exam: General: Mild tacypnea Lungs: Clear to auscultation bilaterally without wheezes or crackles, 2L [place] Cardiovascular: Regular tachycardic rate and rhythm without murmur gallop or rub normal S1 and S2, unilateral right side peripheral edema, MAP >65 Abdomen: Tender diffusely over areas of edema, distended with abdominal wall edema, soft, hypoactive bowel sounds positive, no rebound, no ascites, no appreciable mass Genitourinary; Foley in place- minimal scrotal edema -remains quite tender right groin Musculoskeletal: No significant cyanosis, clubbing of bilateral lower extremities Neurological: Alert and oriented x 3, moves all extremities x 4 without focal neurological deficits, CN 2-12 intact Integumentary: Now with erythematous changes to skin of right and mid abdomen and right thigh to groin (not present yesterday)-improved cellulitic changes to left axilla  Scheduled Meds:  Scheduled Meds: . clindamycin (CLEOCIN) IV  600 mg Intravenous Q8H  . docusate sodium  100 mg Oral BID  . heparin subcutaneous  5,000 Units Subcutaneous Q8H  . lidocaine  2 patch Transdermal Q24H  . pencillin G potassium IV  4 Million Units Intravenous Q4H  . polyethylene glycol  17 g Oral BID  . sodium chloride  3 mL Intravenous Q12H   Continuous Infusions: .  sodium bicarbonate  infusion 1000 mL 75 mL/hr at 07/30/13 0919   Data Reviewed: Basic Metabolic Panel:  Recent Labs Lab 07/28/13 1911 07/29/13 0300 07/29/13 1215 07/29/13 2057 07/30/13 0446  NA 130* 133* 133* 130* 130*  K 3.8 3.5* 3.8 3.4* 3.2*  CL 87* 95* 95* 92* 89*  CO2 20 17* 13* 16*  20  GLUCOSE 171* 110* 111* 192* 121*  BUN 44* 48* 56* 64* 69*  CREATININE 3.80* 3.42* 4.29* 4.72* 5.15*  CALCIUM 9.3 7.2* 7.2* 6.6* 6.8*   Liver Function Tests:  Recent Labs Lab 07/28/13 1911 07/29/13 0300 07/30/13 0446  AST 41* 39* 50*  ALT 40 31 39  ALKPHOS 72 40 48  BILITOT 2.0* 1.5* 1.7*  PROT 8.3 5.9* 5.0*  ALBUMIN 3.2* 2.0* 1.6*     Recent Labs Lab 07/29/13 0300  LIPASE 9*  AMYLASE 12   CBC:  Recent Labs Lab 07/28/13 1911 07/29/13 0205 07/29/13 0300 07/29/13 1215 07/30/13 0446  WBC 12.7* 5.4 5.8 7.1 15.5*  NEUTROABS 11.0*  --  5.1  --   --   HGB 16.6 16.2 15.9 14.9 12.8*  HCT 46.8 45.9 44.4 41.7 35.0*  MCV 85.9 86.6 86.2 86.0 83.3  PLT 179 129* 133* 156 136*   Cardiac Enzymes:  Recent Labs Lab 07/28/13 1911 07/29/13 0300 07/30/13 0446  CKTOTAL  --  1859* 1565*  TROPONINI <0.30  --   --    BNP (last 3 results)  Recent Labs  07/28/13 1911  PROBNP 500.6*   CBG:  Recent Labs Lab 07/28/13 2005  GLUCAP 149*    Recent Results (from the past 240 hour(s))  CULTURE, BLOOD (ROUTINE X 2)     Status: None   Collection Time    07/28/13  7:05 PM      Result Value Range Status   Specimen Description BLOOD RIGHT ANTECUBITAL   Final   Special Requests NONE BOTTLES DRAWN AEROBIC AND ANAEROBIC [facility] EACH   Final   Culture  Setup Time     Final   Value: 07/29/2013 01:25     Performed at [facility]   Culture     Final   Value: GROUP A STREP (S.PYOGENES) ISOLATED     Note: Gram Stain Report Called to,Read Back By and Verified With: Charlsie Merles 07/29/13 1405 BY SMITHERSJ     Performed at [facility]   Report Status PENDING   Incomplete  CULTURE, BLOOD (ROUTINE X 2)     Status: None   Collection Time    07/28/13  7:25 PM      Result Value Range Status   Specimen Description BLOOD LEFT ANTECUBITAL   Final   Special Requests NONE BOTTLES DRAWN AEROBIC AND ANAEROBIC 10CC EACH   Final   Culture  Setup Time     Final   Value: 07/29/2013 01:25     Performed at [facility]   Culture     Final   Value: GROUP A STREP (S.PYOGENES) ISOLATED     Note: Gram Stain Report Called to,Read Back By and Verified With: Charlsie Merles 07/29/13 1405 BY SMITHERSJ     Performed at [facility]   Report Status PENDING   Incomplete  URINE CULTURE     Status: None   Collection  Time    07/29/13 12:43 AM      Result Value Range Status   Specimen Description URINE, RANDOM   Final   Special Requests NONE   Final   Culture  Setup Time     Final   Value: 07/29/2013 08:59     Performed at [facility] Count     Final   Value: NO GROWTH     Performed at [facility]   Culture     Final   Value:  NO GROWTH     Performed at [facility]   Report Status 07/30/2013 FINAL   Final     Studies:  Recent x-ray studies have been reviewed in detail by the Attending Physician     Junious Silk, ANP [facility] Office  [phone] Pager [phone]  **If unable to reach the above provider after paging please contact the Flow Manager @ [phone]  On-Call/Text Page:      [email or web address]      password [email or web address]  If 7PM-7AM, please contact night-coverage [email or web address] Password [email or web address] 07/30/2013, 12:36 PM   LOS: 2 days   I have personally examined this patient and reviewed the entire database. I have reviewed the above note, made any necessary editorial changes, and agree with its content.  Lonia Blood, MD [facility]

## 2013-07-31 ENCOUNTER — Inpatient Hospital Stay (HOSPITAL_COMMUNITY): Payer: BC Managed Care – PPO

## 2013-07-31 ENCOUNTER — Encounter (HOSPITAL_COMMUNITY): Payer: Self-pay | Admitting: Radiology

## 2013-07-31 DIAGNOSIS — L0390 Cellulitis, unspecified: Secondary | ICD-10-CM

## 2013-07-31 DIAGNOSIS — R6520 Severe sepsis without septic shock: Secondary | ICD-10-CM

## 2013-07-31 DIAGNOSIS — I319 Disease of pericardium, unspecified: Secondary | ICD-10-CM

## 2013-07-31 DIAGNOSIS — E860 Dehydration: Secondary | ICD-10-CM

## 2013-07-31 DIAGNOSIS — R071 Chest pain on breathing: Secondary | ICD-10-CM

## 2013-07-31 DIAGNOSIS — A419 Sepsis, unspecified organism: Secondary | ICD-10-CM

## 2013-07-31 DIAGNOSIS — R6521 Severe sepsis with septic shock: Secondary | ICD-10-CM

## 2013-07-31 DIAGNOSIS — N179 Acute kidney failure, unspecified: Secondary | ICD-10-CM

## 2013-07-31 DIAGNOSIS — R079 Chest pain, unspecified: Secondary | ICD-10-CM

## 2013-07-31 DIAGNOSIS — J9600 Acute respiratory failure, unspecified whether with hypoxia or hypercapnia: Secondary | ICD-10-CM

## 2013-07-31 DIAGNOSIS — E872 Acidosis, unspecified: Secondary | ICD-10-CM

## 2013-07-31 DIAGNOSIS — R9431 Abnormal electrocardiogram [ECG] [EKG]: Secondary | ICD-10-CM

## 2013-07-31 DIAGNOSIS — J9601 Acute respiratory failure with hypoxia: Secondary | ICD-10-CM | POA: Diagnosis present

## 2013-07-31 DIAGNOSIS — L0291 Cutaneous abscess, unspecified: Secondary | ICD-10-CM

## 2013-07-31 DIAGNOSIS — J80 Acute respiratory distress syndrome: Secondary | ICD-10-CM | POA: Diagnosis present

## 2013-07-31 DIAGNOSIS — IMO0001 Reserved for inherently not codable concepts without codable children: Secondary | ICD-10-CM

## 2013-07-31 LAB — TROPONIN I
Troponin I: 9.33 ng/mL (ref <0.30)
Troponin I: >20 ng/mL (ref <0.30)

## 2013-07-31 LAB — BLOOD GAS, ARTERIAL
ACID-BASE DEFICIT: 3.8 mmol/L — AB (ref 0.0–2.0)
Acid-base deficit: 1.2 mmol/L (ref 0.0–2.0)
BICARBONATE: 20.1 meq/L (ref 20.0–24.0)
Bicarbonate: 23.6 mEq/L (ref 20.0–24.0)
Drawn by: 331001
Drawn by: 330991
FIO2: 0.5 %
LHR: 28 {breaths}/min
MECHVT: 450 mL
O2 Content: 4 L/min
O2 SAT: 93.9 %
O2 Saturation: 98.3 %
PCO2 ART: 32.7 mmHg — AB (ref 35.0–45.0)
PCO2 ART: 43.2 mmHg (ref 35.0–45.0)
PEEP/CPAP: 10 cmH2O
PO2 ART: 69.4 mmHg — AB (ref 80.0–100.0)
Patient temperature: 98.6
Patient temperature: 98.3
TCO2: 21.1 mmol/L (ref 0–100)
TCO2: 24.9 mmol/L (ref 0–100)
pH, Arterial: 7.407 (ref 7.350–7.450)
pH, Arterial: 7.355 (ref 7.350–7.450)
pO2, Arterial: 107 mmHg — ABNORMAL HIGH (ref 80.0–100.0)

## 2013-07-31 LAB — CBC
HCT: 32.9 % — ABNORMAL LOW (ref 39.0–52.0)
Hemoglobin: 12 g/dL — ABNORMAL LOW (ref 13.0–17.0)
MCH: 30 pg (ref 26.0–34.0)
MCHC: 36.5 g/dL — ABNORMAL HIGH (ref 30.0–36.0)
MCV: 82.3 fL (ref 78.0–100.0)
Platelets: 108 10*3/uL — ABNORMAL LOW (ref 150–400)
RBC: 4 MIL/uL — ABNORMAL LOW (ref 4.22–5.81)
RDW: 12.9 % (ref 11.5–15.5)
WBC: 25.6 10*3/uL — ABNORMAL HIGH (ref 4.0–10.5)

## 2013-07-31 LAB — COMPREHENSIVE METABOLIC PANEL
ALT: 39 U/L (ref 0–53)
AST: 69 U/L — ABNORMAL HIGH (ref 0–37)
Albumin: 1.4 g/dL — ABNORMAL LOW (ref 3.5–5.2)
Alkaline Phosphatase: 87 U/L (ref 39–117)
BUN: 83 mg/dL — ABNORMAL HIGH (ref 6–23)
CO2: 21 mEq/L (ref 19–32)
Calcium: 7 mg/dL — ABNORMAL LOW (ref 8.4–10.5)
Chloride: 87 mEq/L — ABNORMAL LOW (ref 96–112)
Creatinine, Ser: 5.89 mg/dL — ABNORMAL HIGH (ref 0.50–1.35)
GFR calc Af Amer: 12 mL/min — ABNORMAL LOW (ref >90)
GFR calc non Af Amer: 11 mL/min — ABNORMAL LOW (ref >90)
Glucose, Bld: 113 mg/dL — ABNORMAL HIGH (ref 70–99)
Potassium: 3.2 mEq/L — ABNORMAL LOW (ref 3.7–5.3)
Sodium: 131 mEq/L — ABNORMAL LOW (ref 137–147)
Total Bilirubin: 2.2 mg/dL — ABNORMAL HIGH (ref 0.3–1.2)
Total Protein: 5.1 g/dL — ABNORMAL LOW (ref 6.0–8.3)

## 2013-07-31 LAB — POCT I-STAT 3, ART BLOOD GAS (G3+)
ACID-BASE DEFICIT: 4 mmol/L — AB (ref 0.0–2.0)
BICARBONATE: 24.9 meq/L — AB (ref 20.0–24.0)
O2 SAT: 98 %
Patient temperature: 98.1
TCO2: 27 mmol/L (ref 0–100)
pCO2 arterial: 57.3 mmHg (ref 35.0–45.0)
pH, Arterial: 7.245 — ABNORMAL LOW (ref 7.350–7.450)
pO2, Arterial: 131 mmHg — ABNORMAL HIGH (ref 80.0–100.0)

## 2013-07-31 LAB — CARBOXYHEMOGLOBIN
CARBOXYHEMOGLOBIN: 2 % — AB (ref 0.5–1.5)
CARBOXYHEMOGLOBIN: 1.7 % — AB (ref 0.5–1.5)
METHEMOGLOBIN: 1.4 % (ref 0.0–1.5)
Methemoglobin: 1.4 % (ref 0.0–1.5)
O2 SAT: 70 %
O2 Saturation: 77 %
Total hemoglobin: 12.4 g/dL — ABNORMAL LOW (ref 13.5–18.0)
Total hemoglobin: 12 g/dL — ABNORMAL LOW (ref 13.5–18.0)

## 2013-07-31 LAB — ABO/RH: ABO/RH(D): O POS

## 2013-07-31 LAB — HEPARIN LEVEL (UNFRACTIONATED)

## 2013-07-31 LAB — PROCALCITONIN: Procalcitonin: 48.97 ng/mL

## 2013-07-31 LAB — TYPE AND SCREEN
ABO/RH(D): O POS
Antibody Screen: NEGATIVE

## 2013-07-31 LAB — D-DIMER, QUANTITATIVE: D-Dimer, Quant: 12.62 ug/mL-FEU — ABNORMAL HIGH (ref 0.00–0.48)

## 2013-07-31 LAB — PROTIME-INR
INR: 1.22 (ref 0.00–1.49)
PROTHROMBIN TIME: 15.1 s (ref 11.6–15.2)

## 2013-07-31 LAB — CK: Total CK: 903 U/L — ABNORMAL HIGH (ref 7–232)

## 2013-07-31 LAB — FIBRINOGEN

## 2013-07-31 LAB — CORTISOL: Cortisol, Plasma: 41.2 ug/dL

## 2013-07-31 LAB — LACTIC ACID, PLASMA
Lactic Acid, Venous: 1.6 mmol/L (ref 0.5–2.2)
Lactic Acid, Venous: 1.5 mmol/L (ref 0.5–2.2)

## 2013-07-31 MED ORDER — ETOMIDATE 2 MG/ML IV SOLN
INTRAVENOUS | Status: AC
  Administered 2013-07-31: 20 mg
  Filled 2013-07-31: qty 20

## 2013-07-31 MED ORDER — CHLORHEXIDINE GLUCONATE 0.12 % MT SOLN: 15.0000 mL | Freq: Two times a day (BID) | OROMUCOSAL | Status: DC

## 2013-07-31 MED ORDER — PANTOPRAZOLE SODIUM 40 MG IV SOLR
40.0000 mg | Freq: Every day | INTRAVENOUS | Status: DC
  Administered 2013-07-31 – 2013-08-02 (×3): 40 mg via INTRAVENOUS
  Filled 2013-07-31 (×4): qty 40

## 2013-07-31 MED ORDER — FENTANYL CITRATE 0.05 MG/ML IJ SOLN
INTRAMUSCULAR | Status: AC
  Administered 2013-07-31: 100 ug
  Filled 2013-07-31: qty 2

## 2013-07-31 MED ORDER — HEPARIN (PORCINE) IN NACL 100-0.45 UNIT/ML-% IJ SOLN
1300.0000 [IU]/h | INTRAMUSCULAR | Status: DC
  Administered 2013-07-31: 1300 [IU]/h via INTRAVENOUS
  Filled 2013-07-31 (×2): qty 250

## 2013-07-31 MED ORDER — FENTANYL CITRATE 0.05 MG/ML IJ SOLN
50.0000 ug | Freq: Once | INTRAMUSCULAR | Status: AC
  Administered 2013-07-31: 50 ug via INTRAVENOUS

## 2013-07-31 MED ORDER — MIDAZOLAM HCL 2 MG/2ML IJ SOLN
INTRAMUSCULAR | Status: AC
  Administered 2013-07-31: 2 mg
  Filled 2013-07-31: qty 2

## 2013-07-31 MED ORDER — PROPOFOL 10 MG/ML IV EMUL
5.0000 ug/kg/min | INTRAVENOUS | Status: DC
  Administered 2013-07-31: 10 ug/kg/min via INTRAVENOUS
  Administered 2013-08-01: 20 ug/kg/min via INTRAVENOUS
  Filled 2013-07-31 (×2): qty 100

## 2013-07-31 MED ORDER — FENTANYL CITRATE 0.05 MG/ML IJ SOLN
25.0000 ug | INTRAMUSCULAR | Status: DC | PRN
  Administered 2013-07-31: 100 ug via INTRAVENOUS
  Administered 2013-07-31: 50 ug via INTRAVENOUS
  Filled 2013-07-31: qty 2

## 2013-07-31 MED ORDER — DEXTROSE 5 % IV SOLN
2.0000 ug/min | INTRAVENOUS | Status: DC
  Administered 2013-07-31: 5 ug/min via INTRAVENOUS
  Filled 2013-07-31: qty 4

## 2013-07-31 MED ORDER — BIOTENE DRY MOUTH MT LIQD
15.0000 mL | Freq: Four times a day (QID) | OROMUCOSAL | Status: DC
  Administered 2013-07-31 – 2013-08-02 (×8): 15 mL via OROMUCOSAL

## 2013-07-31 MED ORDER — SODIUM CHLORIDE 0.9 % IV SOLN
INTRAVENOUS | Status: DC
  Administered 2013-07-31: 14:00:00 via INTRAVENOUS

## 2013-07-31 MED ORDER — HEPARIN (PORCINE) IN NACL 100-0.45 UNIT/ML-% IJ SOLN
2800.0000 [IU]/h | INTRAMUSCULAR | Status: DC
  Administered 2013-07-31: 1600 [IU]/h via INTRAVENOUS
  Administered 2013-08-01: 2000 [IU]/h via INTRAVENOUS
  Administered 2013-08-02 (×3): 2800 [IU]/h via INTRAVENOUS
  Filled 2013-07-31 (×8): qty 250

## 2013-07-31 MED ORDER — FENTANYL BOLUS VIA INFUSION
50.0000 ug | INTRAVENOUS | Status: DC | PRN
  Filled 2013-07-31: qty 100

## 2013-07-31 MED ORDER — ASPIRIN 81 MG PO CHEW
CHEWABLE_TABLET | ORAL | Status: AC
  Filled 2013-07-31: qty 4

## 2013-07-31 MED ORDER — FENTANYL CITRATE 0.05 MG/ML IJ SOLN
INTRAMUSCULAR | Status: AC
  Administered 2013-07-31: 100 ug via INTRAVENOUS
  Filled 2013-07-31: qty 2

## 2013-07-31 MED ORDER — SODIUM CHLORIDE 0.9 % IV BOLUS (SEPSIS)
1000.0000 mL | Freq: Once | INTRAVENOUS | Status: AC
Start: 2013-07-31 — End: 2013-07-31
  Administered 2013-07-31: 1000 mL via INTRAVENOUS

## 2013-07-31 MED ORDER — SUCCINYLCHOLINE CHLORIDE 20 MG/ML IJ SOLN
INTRAMUSCULAR | Status: AC
  Filled 2013-07-31: qty 1

## 2013-07-31 MED ORDER — CHLORHEXIDINE GLUCONATE 0.12 % MT SOLN
15.0000 mL | Freq: Two times a day (BID) | OROMUCOSAL | Status: DC
  Administered 2013-07-31 – 2013-08-02 (×5): 15 mL via OROMUCOSAL
  Filled 2013-07-31 (×5): qty 15

## 2013-07-31 MED ORDER — SODIUM CHLORIDE 0.9 % IV BOLUS (SEPSIS)
1000.0000 mL | Freq: Once | INTRAVENOUS | Status: AC
  Administered 2013-07-31: 1000 mL via INTRAVENOUS

## 2013-07-31 MED ORDER — NOREPINEPHRINE BITARTRATE 1 MG/ML IJ SOLN
2.0000 ug/min | INTRAVENOUS | Status: DC
  Administered 2013-07-31: 18 ug/min via INTRAVENOUS
  Administered 2013-08-01: 12 ug/min via INTRAVENOUS
  Filled 2013-07-31 (×2): qty 16

## 2013-07-31 MED ORDER — POTASSIUM CHLORIDE 10 MEQ/50ML IV SOLN
10.0000 meq | INTRAVENOUS | Status: AC
  Administered 2013-07-31 (×4): 10 meq via INTRAVENOUS
  Filled 2013-07-31 (×4): qty 50

## 2013-07-31 MED ORDER — ATORVASTATIN CALCIUM 20 MG PO TABS
20.0000 mg | ORAL_TABLET | Freq: Every day | ORAL | Status: DC
  Administered 2013-07-31 – 2013-08-06 (×6): 20 mg via ORAL
  Filled 2013-07-31 (×8): qty 1

## 2013-07-31 MED ORDER — ROCURONIUM BROMIDE 50 MG/5ML IV SOLN
INTRAVENOUS | Status: AC
  Administered 2013-07-31: 20 mg
  Filled 2013-07-31: qty 2

## 2013-07-31 MED ORDER — SODIUM CHLORIDE 0.9 % IV BOLUS (SEPSIS)
1000.0000 mL | INTRAVENOUS | Status: DC | PRN
  Administered 2013-07-31: 1000 mL via INTRAVENOUS

## 2013-07-31 MED ORDER — ASPIRIN 81 MG PO CHEW
324.0000 mg | CHEWABLE_TABLET | Freq: Once | ORAL | Status: AC
  Administered 2013-07-31: 324 mg via ORAL

## 2013-07-31 MED ORDER — SODIUM CHLORIDE 0.9 % IV SOLN
INTRAVENOUS | Status: DC
  Administered 2013-07-31: 09:00:00 via INTRAVENOUS

## 2013-07-31 MED ORDER — LIDOCAINE HCL (CARDIAC) 20 MG/ML IV SOLN
INTRAVENOUS | Status: AC
  Filled 2013-07-31: qty 5

## 2013-07-31 MED ORDER — SODIUM CHLORIDE 0.9 % IV SOLN
0.0000 ug/h | INTRAVENOUS | Status: DC
  Administered 2013-07-31: 100 ug/h via INTRAVENOUS
  Administered 2013-08-01: 150 ug/h via INTRAVENOUS
  Filled 2013-07-31 (×3): qty 50

## 2013-07-31 MED ORDER — ASPIRIN 81 MG PO CHEW
81.0000 mg | CHEWABLE_TABLET | Freq: Every day | ORAL | Status: DC
  Administered 2013-08-01 – 2013-08-07 (×6): 81 mg via ORAL
  Filled 2013-07-31 (×7): qty 1

## 2013-07-31 MED ORDER — SODIUM CHLORIDE 0.9 % IV SOLN: INTRAVENOUS | Status: DC

## 2013-07-31 MED ORDER — HEPARIN BOLUS VIA INFUSION
4000.0000 [IU] | Freq: Once | INTRAVENOUS | Status: AC
  Administered 2013-07-31: 4000 [IU] via INTRAVENOUS
  Filled 2013-07-31: qty 4000

## 2013-07-31 MED ORDER — TRAMADOL HCL 50 MG PO TABS: 50.0000 mg | ORAL_TABLET | Freq: Four times a day (QID) | ORAL | Status: AC | PRN

## 2013-07-31 MED ORDER — BIOTENE DRY MOUTH MT LIQD: 15.0000 mL | Freq: Four times a day (QID) | OROMUCOSAL | Status: DC

## 2013-07-31 NOTE — Progress Notes (Signed)
Subjective: Pt states he feels much better today and decreased pain. STEMI per Cards Hypotension  Objective: Vital signs in last 24 hours: Temp:  [97.8 F (36.6 C)-98.8 F (37.1 C)] 97.8 F (36.6 C) (01/01 0358) Pulse Rate:  [60-122] 66 (01/01 0700) Resp:  [18-28] 22 (01/01 0700) BP: (68-119)/(28-74) 77/49 mmHg (01/01 0700) SpO2:  [92 %-100 %] 100 % (01/01 0700) Weight:  [242 lb 14.4 oz (110.179 kg)] 242 lb 14.4 oz (110.179 kg) (01/01 0430) Last BM Date: 07/30/13  Intake/Output from previous [place]: 12/31 0701 - 01/01 0700 In: 6348 [P.O.:720; I.V.:2703; IV Piggyback:2925] Out: 760 [Urine:760] Intake/Output this shift: Total I/O In: 1076.3 [I.V.:76.3; IV Piggyback:1000] Out: -   General appearance: alert and cooperative Chest wall: no tenderness, erythema greatly decreased, min TTP GI: soft, non-tender; bowel sounds normal; no masses,  no organomegaly  Lab Results:   Recent Labs  07/30/13 0446 07/31/13 0425  WBC 15.5* 25.6*  HGB 12.8* 12.0*  HCT 35.0* 32.9*  PLT 136* 108*   BMET  Recent Labs  07/30/13 0446 07/31/13 0425  NA 130* 131*  K 3.2* 3.2*  CL 89* 87*  CO2 20 21  GLUCOSE 121* 113*  BUN 69* 83*  CREATININE 5.15* 5.89*  CALCIUM 6.8* 7.0*   PT/INR  Recent Labs  07/28/13 1911 07/29/13 0300  LABPROT 14.0 15.4*  INR 1.10 1.25   ABG  Recent Labs  07/29/13 0455  HCO3 16.5*    Studies/Results: Dg Chest 2 View  07/30/2013   CLINICAL DATA:  Pain.  EXAM: CHEST  2 VIEW  COMPARISON:  Chest x-ray 07/30/2013.  Chest CT 07/29/2013.  FINDINGS: Prominent bibasilar atelectasis and/or pneumonia noted. No pneumothorax. Stable cardiomegaly, no pulmonary venous distention or prominent pleural effusion. Mild pleural thickening present. No acute osseous abnormality. Degenerative changes thoracic spine.  IMPRESSION: Dense bibasilar atelectasis and/or infiltrates.   Electronically Signed   By: Maisie Fushomas  Register   On: 07/30/2013 14:09   Dg Abd 1  View  07/30/2013   CLINICAL DATA:  Pain.  EXAM: ABDOMEN - 1 VIEW  COMPARISON:  CT 07/29/2013.  FINDINGS: As noted on CT diffuse right side secondary soft tissue swelling is present. Air-filled nondilated loops of small and large bowel noted. This is a nonspecific finding. Bilateral nephrolithiasis. No acute bony abnormality. Sclerotic density noted in the right ilium most consistent with bone island. No other sclerotic bony densities noted to suggest metastatic disease.  IMPRESSION: 1. Soft tissue swelling of the right abdomen subcutaneous tissue, reference made to recent CT report. 2. Bilateral nephrolithiasis. 3. Nonspecific nondistended air-filled loops of small large bowel noted. 4. Sclerotic density node in the right iliac wing consistent with bone island .   Electronically Signed   By: Maisie Fushomas  Register   On: 07/30/2013 13:33   [place] Renal  07/30/2013   CLINICAL DATA:  Acute renal insufficiency.  Elevated creatinine.  EXAM: RENAL/URINARY TRACT ULTRASOUND COMPLETE  COMPARISON:  CT 07/29/2013.  FINDINGS: Right Kidney:  Length: 12.3 cm. Echogenicity within normal limits. No mass or hydronephrosis visualized.  Left Kidney:  Length: 13.1 cm. Echogenicity within normal limits. 6 mm stone over the lower pole. No mass or hydronephrosis visualized.  Bladder:  Decompressed as Foley catheter is present.  IMPRESSION: Normal size kidneys without evidence of hydronephrosis.  6 mm stone over the left lower pole.   Electronically Signed   By: Elberta Fortisaniel  Boyle M.D.   On: 07/30/2013 21:54   Dg Chest Port 1 View  07/30/2013   CLINICAL DATA:  Shortness of breath  EXAM: PORTABLE CHEST - 1 VIEW  COMPARISON:  07/28/2013  FINDINGS: The heart size and mediastinal contours are within normal limits. Both lungs are clear. The visualized skeletal structures are unremarkable.  IMPRESSION: No active disease.   Electronically Signed   By: Alcide Clever M.D.   On: 07/30/2013 08:13    Anti-infectives: Anti-infectives   Start      Dose/Rate Route Frequency Ordered Stop   07/31/13 0600  vancomycin (VANCOCIN) IVPB 1000 mg/200 mL premix  Status:  Discontinued     1,000 mg 200 mL/hr over 60 Minutes Intravenous Every 48 hours 07/29/13 0305 07/30/13 1109   07/30/13 2200  penicillin G potassium 4 Million Units in dextrose 5 % 250 mL IVPB     4 Million Units 250 mL/hr over 60 Minutes Intravenous 3 times per Deuser 07/30/13 1258     07/30/13 1400  clindamycin (CLEOCIN) IVPB 600 mg    Comments:  Pharmacy may adjust antiboitics as needed; GAS, ? necrotizing   600 mg 100 mL/hr over 30 Minutes Intravenous 3 times per [place] 07/30/13 1109     07/30/13 1200  penicillin G potassium 4 Million Units in dextrose 5 % 250 mL IVPB    Comments:  Group A strep   4 Million Units 250 mL/hr over 60 Minutes Intravenous 6 times per Arriola 07/30/13 1109 07/30/13 1516   07/29/13 0400  vancomycin (VANCOCIN) IVPB 1000 mg/200 mL premix     1,000 mg 200 mL/hr over 60 Minutes Intravenous  Once 07/29/13 0305 07/29/13 0752   07/29/13 0400  piperacillin-tazobactam (ZOSYN) IVPB 3.375 g  Status:  Discontinued     3.375 g 12.5 mL/hr over 240 Minutes Intravenous 3 times per Venturini 07/29/13 0305 07/30/13 1109   07/28/13 1945  vancomycin (VANCOCIN) IVPB 1000 mg/200 mL premix     1,000 mg 200 mL/hr over 60 Minutes Intravenous  Once 07/28/13 1934 07/28/13 2105   07/28/13 1945  piperacillin-tazobactam (ZOSYN) IVPB 3.375 g     3.375 g 12.5 mL/hr over 240 Minutes Intravenous  Once 07/28/13 1934 07/28/13 2033      Assessment/Plan: 43 y/o M with cellutlitis/ myositis to right side of body, ARF, STEMI   1. con't with abx 2. Will add colace for bowel regimen 3. Would rec urine myoglobin labs to check for myoglobin in urine and con't with diruesis 4. No surgical plans at this time.  I do not think this is c/w with nec fasc as erythema and pain have dramatically improved since admission.   LOS: 3 days    Marigene Ehlers., [facility] 07/31/2013

## 2013-07-31 NOTE — Consult Note (Addendum)
CARDIOLOGY CONSULT NOTE  Patient ID: Andrew Hogan MRN: [id number] DOB/AGE: 02/27/1971 43 y.o.  Admit date: 07/28/2013 Reason for Consultation: ST elevation on ECG  HPI: 43 year old otherwise healthy male presented to the ER for progressive right-sided chest and abdominal wall pain ongoing since the previous Wednesday. Patient endorsed on that same date he lifted a heavy box and developed this significant pain. The pain was easily treated initially with ibuprofen and left over hydrocodone from a prior prescription. On the date of admission the pain had increased significantly over a less than 24-hour period and despite utilization of hot compresses and multiple doses of over-the-counter ibuprofen he sought treatment in the emergency department. In addition to the symptoms he had had multiple episodes of loose watery bowel movements without blood over the past 3 days a total of 10-12 stools per [place]. He denied any bowel movements on the date of presentation. In addition he has noticed a rash beginning under his left axilla and was itching and thought he was developing a rash in the periumbilical region. There were concerns initially the patient may be experiencing pulmonary embolus. It was important to note that at presentation patient had tachypnea, was afebrile, was hypotensive with a blood pressure 75/34 and somewhat tachycardic. While in the emergency department he received a total of 3 L of normal saline and started on empiric antibiotic therapy. His blood pressure improved and his MAP was greater than 65 saline as deemed appropriate for non-ICU admission.  Patient was found by CT to have extensive myositis/cellulitis involving the right side of the chest and abdomen.  He was seen by surgery and thought not to have necrotizing fasciitis.  He developed progressive AKI during his hospitalization, most recent creatinine was 5.1.  Tonight, patient became progressively hypotensive with SBP in the 70s  persistently for the last several hours.  In the setting of the hypotension, patient was noted to develop ST elevation on telemetry. ECG was done showing 1 mm ST elevation (new) in inferior leads and V5/V6.  He has been getting IV fluid resuscitation this evening without much effect on BP so far.  Troponin just returned and was 9.  Patient denies chest pain.  He is, however, having significant pain in the base of his posterior neck.   This pain began this morning.  Initial echo done after admission was basically normal.  Review of systems complete and found to be negative unless listed above in HPI  Past Medical History: No significant past history   FH: No premature CAD  History   Social History  . Marital Status: Single    Spouse Name: N/A    Number of Children: N/A  . Years of Education: N/A   Occupational History  . Not on file.   Social History Main Topics  . Smoking status: Never Smoker   . Smokeless tobacco: Not on file  . Alcohol Use: No  . Drug Use: No  . Sexual Activity: Not on file   Other Topics Concern  . Not on file   Social History Narrative  . No narrative on file     Prescriptions prior to admission  Medication Sig Dispense Refill  . HYDROCODONE-ACETAMINOPHEN PO Take 1 tablet by mouth every 8 (eight) hours as needed (for pain).      Marland Kitchen ibuprofen (ADVIL,MOTRIN) 200 MG tablet Take 800 mg by mouth every 8 (eight) hours as needed for mild pain.        Physical exam Blood pressure  76/42, pulse 62, temperature 97.8 F (36.6 C), temperature source Oral, resp. rate 18, height 5\' 11"  (1.803 m), weight 110.179 kg (242 lb 14.4 oz), SpO2 100.00%. General: sitting up, NAD Neck: JVP 8-9 cm, no thyromegaly or thyroid nodule.  Lungs: Slight crackles at bases bilaterally CV: Nondisplaced PMI.  Heart regular S1/S2, no S3/S4, no murmur.  No peripheral edema.   Abdomen: Soft, nontender, no hepatosplenomegaly, no distention.  Skin: erythema right chest wall and abdominal  wall.  Neurologic: Alert and oriented x 3.  Psych: Normal affect. Extremities: No clubbing or cyanosis.  HEENT: Normal.   Labs:   Lab Results  Component Value Date   WBC 25.6* 07/31/2013   HGB 12.0* 07/31/2013   HCT 32.9* 07/31/2013   MCV 82.3 07/31/2013   PLT 108* 07/31/2013    Recent Labs Lab 07/30/13 0446  NA 130*  K 3.2*  CL 89*  CO2 20  BUN 69*  CREATININE 5.15*  CALCIUM 6.8*  PROT 5.0*  BILITOT 1.7*  ALKPHOS 48  ALT 39  AST 50*  GLUCOSE 121*   TnI 9.33 PCT 55 => 49     EKG: NSR, 1 mm ST elevation inferior leads and V5/V6.   ASSESSMENT AND PLAN:  43 yo with minimal PMH presented with extensive right chest wall and abdominal wall cellulitis/myositis with GAS bacteremia.  Tonight, he developed hypotension and ECG changes.  1. ID: GAS bacteremia with extensive cellulitis/myositis right chest wall/abdominal wall.  I think that tonight's BP fall represents septic shock.  He was hypotensive on admission which corrected with fluid.  Now BP is down again, WBCs higher, PCT still very elevated.   - Continue IV fluid infusion. - With creatinine 5.1 and poor UOP, suspect that volume is nearing repletion. Would recommend central line for CVP monitoring.  I think that he is also going to need norepinephrine soon.   - Will involve CCM. 2. AKI: Suspect this is due to septic shock + extensive NSAID use at home.  Poor UOP overnight.  3. Cardiac: Elevated TnI and ST elevation in the setting of septic shock.  No chest pain.  Patient does not have risk factors for CAD.  I wonder if these changes represent the development of a stress/Takotsubo-type cardiomyopathy (can occur in the setting of severe sepsis). - Will repeat echo now.  Initial echo was normal.  - Reasonable to treat with ASA 81 and heparin gtt.  He is not a candidate for the cath lab at this time given septic shock and AKI (creatinine 5.1).  4. Neck pain: Pain in the base of his posterior neck.  I am not sure what this represents.   There is some tenderness.  I am concerned that this be an infectious sequela and patient may need imaging of this area when more stable.   Marca AnconaDalton Oumar Marcott 07/31/2013 6:48 AM

## 2013-07-31 NOTE — Progress Notes (Signed)
Pt c/o ongoing neck pain, and chest "discomfort" and not feeling good. SBP 73/38. EKG done. ST elevation noted leads 2, 3 AVF and V6. Dr. Conley RollsLe notified and at bedside.

## 2013-07-31 NOTE — Progress Notes (Signed)
eLink Physician-Brief Progress Note Patient Name: Andrew MannersWen Dai DOB: 08/26/1970 MRN: [id number]  Date of Service  07/31/2013   HPI/Events of Note   Pt with cellulitis and myositis. Pt may have fasciitis   eICU Interventions  Sent to CT scan for imaging to r/o gas in chest /abd wall    Intervention Category Major Interventions: Sepsis - evaluation and management Intermediate Interventions: Infection - evaluation and management  Shan Levansatrick Zana Biancardi 07/31/2013, 4:47 PM

## 2013-07-31 NOTE — Progress Notes (Signed)
ANTICOAGULATION CONSULT NOTE - Follow- up Consult  Pharmacy Consult for Heparin Indication: chest pain/ACS  No Known Allergies  Patient Measurements: Height: 5\' 11"  (180.3 cm) Weight: 242 lb 14.4 oz (110.179 kg) IBW/kg (Calculated) : 75.3 Heparin Dosing Weight: 100 kg Vital Signs: Temp: 98.3 F (36.8 C) (01/01 1600) Temp src: Oral (01/01 1600) BP: 120/72 mmHg (01/01 1651) Pulse Rate: 105 (01/01 1800)  Labs:  Recent Labs  07/28/13 1911  07/29/13 0300 07/29/13 1215 07/29/13 2057 07/30/13 0446 07/31/13 0425 07/31/13 0915 07/31/13 1141 07/31/13 1720  HGB 16.6  < > 15.9 14.9  --  12.8* 12.0*  --   --   --   HCT 46.8  < > 44.4 41.7  --  35.0* 32.9*  --   --   --   PLT 179  < > 133* 156  --  136* 108*  --   --   --   APTT  --   --  46*  --   --   --   --   --   --   --   LABPROT 14.0  --  15.4*  --   --   --   --  15.1  --   --   INR 1.10  --  1.25  --   --   --   --  1.22  --   --   HEPARINUNFRC  --   --   --   --   --   --   --   --  <0.10* <0.10*  CREATININE 3.80*  --  3.42* 4.29* 4.72* 5.15* 5.89*  --   --   --   CKTOTAL  --   --  1859*  --   --  1565* 903*  --   --   --   TROPONINI <0.30  --   --   --   --   --  9.33* >20.00*  --   --   < > = values in this interval not displayed.  Estimated Creatinine Clearance: 20.2 ml/min (by C-G formula based on Cr of 5.89).  Medications:  Scheduled:  . antiseptic oral rinse  15 mL Mouth Rinse QID  . aspirin  81 mg Oral Daily  . atorvastatin  20 mg Oral q1800  . chlorhexidine  15 mL Mouth/Throat BID  . clindamycin (CLEOCIN) IV  600 mg Intravenous Q8H  . docusate sodium  100 mg Oral BID  . lidocaine (cardiac) 100 mg/575ml      . lidocaine  2 patch Transdermal Q24H  . pantoprazole (PROTONIX) IV  40 mg Intravenous Daily  . pencillin G potassium IV  4 Million Units Intravenous Q8H  . polyethylene glycol  17 g Oral BID  . sodium chloride  3 mL Intravenous Q12H  . succinylcholine        Assessment: 43 yo male with chest  pain/EKG changes for heparin.  Heparin level undetectable on 1300 units/hr.  Spoke with RN, no issues with IV.  Goal of Therapy:  Heparin level 0.3-0.7 units/ml Monitor platelets by anticoagulation protocol: Yes   Plan:  Increase IV heparin to 1600 units/hr. Recheck heparin level in 8 hrs. Daily HL and CBC.  Tad MooreJessica Keshaun Dubey, Pharm D, BCPS  Clinical Pharmacist Pager [phone]  07/31/2013 7:03 PM

## 2013-07-31 NOTE — Procedures (Signed)
Intubation Procedure Note Andrew MannersWen Hogan [id number] 06/06/1971  Procedure: Intubation Indications: Respiratory insufficiency  Procedure Details Consent: Risks of procedure as well as the alternatives and risks of each were explained to the (patient/caregiver).  Consent for procedure obtained. Time Out: Verified patient identification, verified procedure, site/side was marked, verified correct patient position, special equipment/implants available, medications/allergies/relevent history reviewed, required imaging and test results available.  Performed  Drugs versed 2mg , fentanyl14700mcg, Etomidate 20mg , Rocuronium 100mg  DL x 1 with MAC 3 blade Grade 3 view (only saw epiglottis) 7.5 tube passed through cords under direct visualization Placement confirmed with bilateral breath sounds, positive EtCO2 change and smoke in tube   Evaluation Hemodynamic Status: BP stable throughout; O2 sats: stable throughout Patient's Current Condition: stable Complications: No apparent complications Patient did tolerate procedure well. Chest X-ray ordered to verify placement.  CXR: pending.   Andrew Hogan 07/31/2013

## 2013-07-31 NOTE — Procedures (Signed)
Central Venous Catheter Insertion Procedure Note Andrew MannersWen Hogan [id number] 06/25/1971  Procedure: Insertion of Central Venous Catheter Indications: Assessment of intravascular volume and Drug and/or fluid administration  Procedure Details Consent: Risks of procedure as well as the alternatives and risks of each were explained to the (patient/caregiver).  Consent for procedure obtained. Time Out: Verified patient identification, verified procedure, site/side was marked, verified correct patient position, special equipment/implants available, medications/allergies/relevent history reviewed, required imaging and test results available.  Performed  Maximum sterile technique was used including antiseptics, cap, gloves, gown, hand hygiene, mask and sheet. Skin prep: Chlorhexidine; local anesthetic administered A antimicrobial bonded/coated triple lumen catheter was placed in the left internal jugular vein using the Seldinger technique.  Ultrasound was used to verify the patency of the vein and for real time needle guidance.  Evaluation Blood flow good Complications: No apparent complications Patient did tolerate procedure well. Chest X-ray ordered to verify placement.  CXR: pending.  MCQUAID, DOUGLAS 07/31/2013, 7:11 AM

## 2013-07-31 NOTE — Progress Notes (Signed)
eLink Physician-Brief Progress Note Patient Name: [name] DOB: 05/26/1971 MRN: [id number]  Date of Service  07/31/2013   HPI/Events of Note   No evident necrotizing fasciitis seen on CT abd or pelvis  eICU Interventions  Cont current therapy No indication for surgical intervention   Intervention Category Intermediate Interventions: Infection - evaluation and management  Shan Levansatrick Wright 07/31/2013, 11:15 PM

## 2013-07-31 NOTE — Progress Notes (Signed)
Assessment:  1 Oliguric AKI patient with rising creatinine from 3.8 on admission to 5.89 today (baseline Cr unknown), due to septic shock (with possible contribution from acute NSAID use, less likely rhabdo).   2 Septic shock, due to Group A strep bacteremia and cellulitis/myositis. 3 Metabolic Acidosis (AG), patient on sodium bicarbonate 75 ml/hr IVF, improving  4 hypokalemia, hyponatremia, hypoalbuminemia  5 Elevated troponin and EKG changes REC:  1 Diuretic challenge when able 2 Reduce bicarb and fluids 3 Dialysis if diuretics not effective  4 K replace  Subjective: Interval History: Hypotension and ST elevation last PM with increased troponins  Objective: Vital signs in last 24 hours: Temp:  [97.8 F (36.6 C)-98.8 F (37.1 C)] 98.1 F (36.7 C) (01/01 0815) Pulse Rate:  [60-122] 72 (01/01 0900) Resp:  [18-28] 26 (01/01 0900) BP: (68-119)/(28-74) 90/48 mmHg (01/01 0900) SpO2:  [92 %-100 %] 100 % (01/01 0900) Weight:  [110.179 kg (242 lb 14.4 oz)] 110.179 kg (242 lb 14.4 oz) (01/01 0430) Weight change: 3.879 kg (8 lb 8.8 oz)  Intake/Output from previous [facility]: 12/31 0701 - 01/01 0700 In: 6348 [P.O.:720; I.V.:2703; IV Piggyback:2925] Out: 760 [Urine:760] Intake/Output this shift: Total I/O In: 4225 [I.V.:225; IV Piggyback:4000] Out: 80 [Urine:80]  General appearance: alert and cooperative Chest wall: right sided chest wall tenderness less than yesterday Cardio: regular rate and rhythm, S1, S2 normal, no murmur, click, rub or gallop GI: soft, non-tender; bowel sounds normal; no masses,  no organomegaly Extremities: edema right thigh swollen  Lab Results:  Recent Labs  07/30/13 0446 07/31/13 0425  WBC 15.5* 25.6*  HGB 12.8* 12.0*  HCT 35.0* 32.9*  PLT 136* 108*   BMET:  Recent Labs  07/30/13 0446 07/31/13 0425  NA 130* 131*  K 3.2* 3.2*  CL 89* 87*  CO2 20 21  GLUCOSE 121* 113*  BUN 69* 83*  CREATININE 5.15* 5.89*  CALCIUM 6.8* 7.0*   No results found  for this basename: PTH,  in the last 72 hours Iron Studies: No results found for this basename: IRON, TIBC, TRANSFERRIN, FERRITIN,  in the last 72 hours Studies/Results: Dg Chest 2 View  07/30/2013   CLINICAL DATA:  Pain.  EXAM: CHEST  2 VIEW  COMPARISON:  Chest x-ray 07/30/2013.  Chest CT 07/29/2013.  FINDINGS: Prominent bibasilar atelectasis and/or pneumonia noted. No pneumothorax. Stable cardiomegaly, no pulmonary venous distention or prominent pleural effusion. Mild pleural thickening present. No acute osseous abnormality. Degenerative changes thoracic spine.  IMPRESSION: Dense bibasilar atelectasis and/or infiltrates.   Electronically Signed   By: Maisie Fus  Register   On: 07/30/2013 14:09   Dg Abd 1 View  07/30/2013   CLINICAL DATA:  Pain.  EXAM: ABDOMEN - 1 VIEW  COMPARISON:  CT 07/29/2013.  FINDINGS: As noted on CT diffuse right side secondary soft tissue swelling is present. Air-filled nondilated loops of small and large bowel noted. This is a nonspecific finding. Bilateral nephrolithiasis. No acute bony abnormality. Sclerotic density noted in the right ilium most consistent with bone island. No other sclerotic bony densities noted to suggest metastatic disease.  IMPRESSION: 1. Soft tissue swelling of the right abdomen subcutaneous tissue, reference made to recent CT report. 2. Bilateral nephrolithiasis. 3. Nonspecific nondistended air-filled loops of small large bowel noted. 4. Sclerotic density node in the right iliac wing consistent with bone island .   Electronically Signed   By: Maisie Fus  Register   On: 07/30/2013 13:33   US Renal  07/30/2013   CLINICAL DATA:  Acute renal  insufficiency.  Elevated creatinine.  EXAM: RENAL/URINARY TRACT ULTRASOUND COMPLETE  COMPARISON:  CT 07/29/2013.  FINDINGS: Right Kidney:  Length: 12.3 cm. Echogenicity within normal limits. No mass or hydronephrosis visualized.  Left Kidney:  Length: 13.1 cm. Echogenicity within normal limits. 6 mm stone over the lower pole.  No mass or hydronephrosis visualized.  Bladder:  Decompressed as Foley catheter is present.  IMPRESSION: Normal size kidneys without evidence of hydronephrosis.  6 mm stone over the left lower pole.   Electronically Signed   By: Elberta Fortisaniel  Boyle M.D.   On: 07/30/2013 21:54   Dg Chest Port 1 View  07/31/2013   CLINICAL DATA:  Shortness of breath, status post line placement  EXAM: PORTABLE CHEST - 1 VIEW  COMPARISON:  07/30/2013  FINDINGS: There is a left-sided jugular central venous catheter with the tip projecting over the confluence of the left brachiocephalic and SVC. There are bilateral interstitial and alveolar airspace opacities primarily at the lung bases. There is no pleural effusion or pneumothorax. Stable cardiomediastinal silhouette. The osseous structures are unremarkable.  IMPRESSION: 1. Left-sided jugular central venous catheter with the tip projecting over the confluence of the left brachiocephalic and SVC. 2. Bilateral interstitial and alveolar airspace opacities as can be seen with an infectious or inflammatory etiology versus interstitial edema.   Electronically Signed   By: Elige KoHetal  Patel   On: 07/31/2013 07:59   Dg Chest Port 1 View  07/30/2013   CLINICAL DATA:  Shortness of breath  EXAM: PORTABLE CHEST - 1 VIEW  COMPARISON:  07/28/2013  FINDINGS: The heart size and mediastinal contours are within normal limits. Both lungs are clear. The visualized skeletal structures are unremarkable.  IMPRESSION: No active disease.   Electronically Signed   By: Alcide CleverMark  Lukens M.D.   On: 07/30/2013 08:13   Scheduled: . aspirin  81 mg Oral Daily  . atorvastatin  20 mg Oral q1800  . clindamycin (CLEOCIN) IV  600 mg Intravenous Q8H  . docusate sodium  100 mg Oral BID  . lidocaine  2 patch Transdermal Q24H  . pencillin G potassium IV  4 Million Units Intravenous Q8H  . polyethylene glycol  17 g Oral BID  . sodium chloride  3 mL Intravenous Q12H       LOS: 3 days   Rebbie Lauricella C 07/31/2013,10:33  AM

## 2013-07-31 NOTE — Progress Notes (Signed)
Dr. Kendrick FriesMcQuaid notified of pt complaint of shortness of breath.  Will continue to monitor pt closely.

## 2013-07-31 NOTE — Progress Notes (Signed)
ANTICOAGULATION CONSULT NOTE - Initial Consult  Pharmacy Consult for Heparin Indication: chest pain/ACS  No Known Allergies  Patient Measurements: Height: 5\' 11"  (180.3 cm) Weight: 242 lb 14.4 oz (110.179 kg) IBW/kg (Calculated) : 75.3 Heparin Dosing Weight: 100 kg Vital Signs: Temp: 97.8 F (36.6 C) (01/01 0358) Temp src: Oral (01/01 0358) BP: 90/47 mmHg (01/01 0405)  Labs:  Recent Labs  07/28/13 1911  07/29/13 0300 07/29/13 1215 07/29/13 2057 07/30/13 0446  HGB 16.6  < > 15.9 14.9  --  12.8*  HCT 46.8  < > 44.4 41.7  --  35.0*  PLT 179  < > 133* 156  --  136*  APTT  --   --  46*  --   --   --   LABPROT 14.0  --  15.4*  --   --   --   INR 1.10  --  1.25  --   --   --   CREATININE 3.80*  --  3.42* 4.29* 4.72* 5.15*  CKTOTAL  --   --  1859*  --   --  1565*  TROPONINI <0.30  --   --   --   --   --   < > = values in this interval not displayed.  Estimated Creatinine Clearance: 23.1 ml/min (by C-G formula based on Cr of 5.15).  Medications:  Scheduled:  . aspirin      . atorvastatin  20 mg Oral q1800  . clindamycin (CLEOCIN) IV  600 mg Intravenous Q8H  . docusate sodium  100 mg Oral BID  . heparin subcutaneous  5,000 Units Subcutaneous Q8H  . lidocaine  2 patch Transdermal Q24H  . pencillin G potassium IV  4 Million Units Intravenous Q8H  . polyethylene glycol  17 g Oral BID  . sodium chloride  3 mL Intravenous Q12H    Assessment: 43 yo male with chest pain/EKG changes for heparin  Goal of Therapy:  Heparin level 0.3-0.7 units/ml Monitor platelets by anticoagulation protocol: Yes   Plan:  Heparin 4000 units IV bolus, then 1300 units/hr Check heparin level in 6 hours.  Nieve Rojero, Gary FleetGregory Vernon 07/31/2013,4:35 AM

## 2013-07-31 NOTE — Progress Notes (Signed)
Echocardiogram 2D Echocardiogram has been performed.  Andrew BasemanReel, [name] 07/31/2013, 9:12 AM

## 2013-07-31 NOTE — Progress Notes (Signed)
[facility]  Date of Admission:  07/28/2013  Antibiotics: Antibiotics Given (last 72 hours)   Date/Time Action Medication Dose Rate   07/29/13 0539 Given   piperacillin-tazobactam (ZOSYN) IVPB 3.375 g 3.375 g 12.5 mL/hr   07/29/13 [id number] Given   vancomycin (VANCOCIN) IVPB 1000 mg/200 mL premix 1,000 mg 200 mL/hr   07/29/13 1500 Given   piperacillin-tazobactam (ZOSYN) IVPB 3.375 g 3.375 g 12.5 mL/hr   07/29/13 2215 Given   piperacillin-tazobactam (ZOSYN) IVPB 3.375 g 3.375 g 12.5 mL/hr   07/30/13 [id number] Given   piperacillin-tazobactam (ZOSYN) IVPB 3.375 g 3.375 g 12.5 mL/hr   07/30/13 1416 Given   penicillin G potassium 4 Million Units in dextrose 5 % 250 mL IVPB 4 Million Units 250 mL/hr   07/30/13 1416 Given   clindamycin (CLEOCIN) IVPB 600 mg 600 mg 100 mL/hr   07/30/13 2143 Given   penicillin G potassium 4 Million Units in dextrose 5 % 250 mL IVPB 4 Million Units 250 mL/hr   07/30/13 2144 Given   clindamycin (CLEOCIN) IVPB 600 mg 600 mg 100 mL/hr   07/31/13 0535 Given   clindamycin (CLEOCIN) IVPB 600 mg 600 mg 100 mL/hr   07/31/13 0535 Given   penicillin G potassium 4 Million Units in dextrose 5 % 250 mL IVPB 4 Million Units 250 mL/hr      Subjective: Events overnight reviewed, STEMI  Objective: Temp:  [97.8 F (36.6 C)-98.8 F (37.1 C)] 98.1 F (36.7 C) (01/01 0815) Pulse Rate:  [60-122] 72 (01/01 0900) Resp:  [18-28] 26 (01/01 0900) BP: (68-119)/(28-74) 90/48 mmHg (01/01 0900) SpO2:  [92 %-100 %] 100 % (01/01 0900) Weight:  [242 lb 14.4 oz (110.179 kg)] 242 lb 14.4 oz (110.179 kg) (01/01 0430)  General: Awake, alert, no acute distress Skin: right torso with same erythema, less edema, no tenderness, improved overall Lungs: CTA B Cor: RRR without m/r/g Abdomen: soft, nt, nd Ext: no edema GU: no extension of erythema  Lab Results Lab Results  Component Value Date   WBC 25.6* 07/31/2013   HGB 12.0* 07/31/2013   HCT 32.9* 07/31/2013   MCV 82.3 07/31/2013   PLT 108* 07/31/2013    Lab Results  Component Value Date   CREATININE 5.89* 07/31/2013   BUN 83* 07/31/2013   NA 131* 07/31/2013   K 3.2* 07/31/2013   CL 87* 07/31/2013   CO2 21 07/31/2013    Lab Results  Component Value Date   ALT 39 07/31/2013   AST 69* 07/31/2013   ALKPHOS 87 07/31/2013   BILITOT 2.2* 07/31/2013      Microbiology: Recent Results (from the past 240 hour(s))  CULTURE, BLOOD (ROUTINE X 2)     Status: None   Collection Time    07/28/13  7:05 PM      Result Value Range Status   Specimen Description BLOOD RIGHT ANTECUBITAL   Final   Special Requests NONE BOTTLES DRAWN AEROBIC AND ANAEROBIC [facility]5CC EACH   Final   Culture  Setup Time     Final   Value: 07/29/2013 01:25     Performed at [facility]   Culture     Final   Value: GROUP A STREP (S.PYOGENES) ISOLATED     Note: Gram Stain Report Called to,Read Back By and Verified With: Charlsie MerlesERESA O LEARY 07/29/13 1405 BY SMITHERSJ     Performed at [facility]   Report Status PENDING   Incomplete  CULTURE, BLOOD (ROUTINE X 2)  Status: None   Collection Time    07/28/13  7:25 PM      Result Value Range Status   Specimen Description BLOOD LEFT ANTECUBITAL   Final   Special Requests NONE BOTTLES DRAWN AEROBIC AND ANAEROBIC 10CC EACH   Final   Culture  Setup Time     Final   Value: 07/29/2013 01:25     Performed at [facility]   Culture     Final   Value: GROUP A STREP (S.PYOGENES) ISOLATED     Note: Gram Stain Report Called to,Read Back By and Verified With: Charlsie Merles 07/29/13 1405 BY SMITHERSJ     Performed at [facility]   Report Status PENDING   Incomplete  URINE CULTURE     Status: None   Collection Time    07/29/13 12:43 AM      Result Value Range Status   Specimen Description URINE, RANDOM   Final   Special Requests NONE   Final   Culture  Setup Time     Final   Value: 07/29/2013 08:59     Performed at [facility]   Colony Count     Final   Value: NO GROWTH      Performed at [facility]   Culture     Final   Value: NO GROWTH     Performed at [facility]   Report Status 07/30/2013 FINAL   Final    Studies/Results: Dg Chest 2 View  07/30/2013   CLINICAL DATA:  Pain.  EXAM: CHEST  2 VIEW  COMPARISON:  Chest x-ray 07/30/2013.  Chest CT 07/29/2013.  FINDINGS: Prominent bibasilar atelectasis and/or pneumonia noted. No pneumothorax. Stable cardiomegaly, no pulmonary venous distention or prominent pleural effusion. Mild pleural thickening present. No acute osseous abnormality. Degenerative changes thoracic spine.  IMPRESSION: Dense bibasilar atelectasis and/or infiltrates.   Electronically Signed   By: Maisie Fus  Register   On: 07/30/2013 14:09   Dg Abd 1 View  07/30/2013   CLINICAL DATA:  Pain.  EXAM: ABDOMEN - 1 VIEW  COMPARISON:  CT 07/29/2013.  FINDINGS: As noted on CT diffuse right side secondary soft tissue swelling is present. Air-filled nondilated loops of small and large bowel noted. This is a nonspecific finding. Bilateral nephrolithiasis. No acute bony abnormality. Sclerotic density noted in the right ilium most consistent with bone island. No other sclerotic bony densities noted to suggest metastatic disease.  IMPRESSION: 1. Soft tissue swelling of the right abdomen subcutaneous tissue, reference made to recent CT report. 2. Bilateral nephrolithiasis. 3. Nonspecific nondistended air-filled loops of small large bowel noted. 4. Sclerotic density node in the right iliac wing consistent with bone island .   Electronically Signed   By: Maisie Fus  Register   On: 07/30/2013 13:33   US Renal  07/30/2013   CLINICAL DATA:  Acute renal insufficiency.  Elevated creatinine.  EXAM: RENAL/URINARY TRACT ULTRASOUND COMPLETE  COMPARISON:  CT 07/29/2013.  FINDINGS: Right Kidney:  Length: 12.3 cm. Echogenicity within normal limits. No mass or hydronephrosis visualized.  Left Kidney:  Length: 13.1 cm. Echogenicity within normal limits. 6 mm stone over the  lower pole. No mass or hydronephrosis visualized.  Bladder:  Decompressed as Foley catheter is present.  IMPRESSION: Normal size kidneys without evidence of hydronephrosis.  6 mm stone over the left lower pole.   Electronically Signed   By: Elberta Fortis M.D.   On: 07/30/2013 21:54   Dg Chest [facility] [address]  07/31/2013   CLINICAL DATA:  Shortness of breath, status post line placement  EXAM: PORTABLE CHEST - 1 VIEW  COMPARISON:  07/30/2013  FINDINGS: There is a left-sided jugular central venous catheter with the tip projecting over the confluence of the left brachiocephalic and SVC. There are bilateral interstitial and alveolar airspace opacities primarily at the lung bases. There is no pleural effusion or pneumothorax. Stable cardiomediastinal silhouette. The osseous structures are unremarkable.  IMPRESSION: 1. Left-sided jugular central venous catheter with the tip projecting over the confluence of the left brachiocephalic and SVC. 2. Bilateral interstitial and alveolar airspace opacities as can be seen with an infectious or inflammatory etiology versus interstitial edema.   Electronically Signed   By: Elige Ko   On: 07/31/2013 07:59   Dg Chest Port 1 View  07/30/2013   CLINICAL DATA:  Shortness of breath  EXAM: PORTABLE CHEST - 1 VIEW  COMPARISON:  07/28/2013  FINDINGS: The heart size and mediastinal contours are within normal limits. Both lungs are clear. The visualized skeletal structures are unremarkable.  IMPRESSION: No active disease.   Electronically Signed   By: Alcide Clever M.D.   On: 07/30/2013 08:13    Assessment/Plan: 1) Myositis and cellulitis due to group A Strep - doing better with current antibiotics.  No signs of gas on CXR this am from my review or from report.  No signs of necrosis.   -reviewed CXR, EKG  COMER, ROBERT, MD [facility] [email or web address] [phone] pager   [phone] cell 07/31/2013, 11:26 AM

## 2013-07-31 NOTE — Progress Notes (Addendum)
CTSP re EKG with ST elevations in II, III, AVF, and V6. Patient was admitted for sepsis, with abdominal cellulitis.  He was hypotensive with SBP in the 70's, though alert and conversing.  He was started on broad spectrum antibiotics, and was given IVF.  He had AKI, with rising Cr of 5.15 today.  He has some pressure on this chest, but it was minimal, and he denied actually having any chest pain.  He did have pain in his left neck, which was the reason for the EKG. The EKG showed ST elevations in the leads mentioned above.  I spoke with Dr Shirlee LatchMcLean of cardiology, and due to the fact that he is septic, having elevated Cr of 5.6, with no chest pain, no shortness of breath, that he doesn't think taking him to the Cath lab would be a good idea.  He recommended medical treatments.  Will start him on IV Heparin, give ASA, and small dose of statin ( his CPK is elevated), and he needs IVF.  Will hold off on pressor at this point, but will start if he is persistently hypotensive.    On  Exam, he is alert and orient, lungs are still clear.  Abdominal wall showed the erythematous rash, and scrotal swelling. Heart with S1S2 and regular, no murmur or pericardial rubs.  A/P:  ST elevation           AKI         Neck pain         Sepsis with hypotension         FULL CODE.  Plan as outlined above.  Addendum:  I have spoken with PCCM concerning transferring patient to ICU for pressor, as he is still hypotensive.  Dr Herma CarsonZ accepted patient in transfer.

## 2013-07-31 NOTE — Procedures (Signed)
Arterial Catheter Insertion Procedure Note Andrew Hogan [id number] 07/24/1971  Procedure: Insertion of Arterial Catheter  Indications: Blood pressure monitoring and Frequent blood sampling  Procedure Details Consent: Risks of procedure as well as the alternatives and risks of each were explained to the (patient/caregiver).  Consent for procedure obtained. Time Out: Verified patient identification, verified procedure, site/side was marked, verified correct patient position, special equipment/implants available, medications/allergies/relevent history reviewed, required imaging and test results available.  Performed  Maximum sterile technique was used including antiseptics, cap, gloves, gown, hand hygiene, mask and sheet. Skin prep: Chlorhexidine; local anesthetic administered 20 gauge catheter was inserted into right radial artery using the Seldinger technique.  Evaluation Blood flow good; BP tracing good. Complications: No apparent complications.   Andrew Hogan, Andrew Hogan 07/31/2013

## 2013-07-31 NOTE — Progress Notes (Signed)
Dr. Kendrick FriesMcQuaid notified of pt complaint of worsening shortness of breath.  At bedside to evaluate pt and discussed the need for intubation.  Patient in agreement, wife called to hospital to confer with pt and MD.  Will continue to monitor pt closely.

## 2013-07-31 NOTE — Consult Note (Signed)
Name: Andrew Hogan MRN: [id number] DOB: 08/08/1970    ADMISSION DATE:  07/28/2013 CONSULTATION DATE:  07/31/13  REFERRING MD :  Floria RavelingLe, [facility] PRIMARY SERVICE: [facility] > PCCM  CHIEF COMPLAINT:  Abdominal pain  BRIEF PATIENT DESCRIPTION: 43 y/o male with no past medical history was admitted on 12/29 with myositis and cellulitis of his abdominal wall on 12/29.  He developed hypotension, renal failure, and an STEMI on 1/1 in the early AM so PCCM was consulted.  SIGNIFICANT EVENTS / STUDIES:  12/30 CT Chest/AB/P> extensive soft tissue and intramuscular cellulitis/infiltration and edema from left shoulder to pelvis, no discrete abscess identified 12/31 Renal U/S > normal size kidneys, no hydro, 6mm stone left lower pole 1/1 Echo >>   LINES / TUBES: 1/1 L IJ CVL >>  CULTURES: 12/29 blood >> Group a strep 12/30 urine >> negative  ANTIBIOTICS: 12/29 vanc x1 12/29 zosyn > 12/30 12/31 clinda > 12/31 pen G >  HISTORY OF PRESENT ILLNESS:  43 y/o male with no past medical history was admitted on 12/29 with myositis and cellulitis of his abdominal wall on 12/29.  He developed hypotension, renal failure, and an STEMI on 1/1 in the early AM so PCCM was consulted.   The patient initially noted some abdominal pain which he attributed to a pulled muscle 1-2 days prior to admission.  However by the time he presented to the ED on 12/29 he had redness on his abdominal wall, swelling, and hypotension.  No recent trauma or insect bites.  By 12/31 he started developing neck pain and his nurse noted changes on his EKG.  A 12 lead was performed and demonstrated ST elevation in the inferior leads.   By this point his creatinine was 5 and his troponin was elevated.    PAST MEDICAL HISTORY :  History reviewed. No pertinent past medical history. History reviewed. No pertinent past surgical history. Prior to Admission medications   Medication Sig Start Date End Date Taking? Authorizing Provider  HYDROCODONE-ACETAMINOPHEN  PO Take 1 tablet by mouth every 8 (eight) hours as needed (for pain).   Yes Historical Provider, MD  ibuprofen (ADVIL,MOTRIN) 200 MG tablet Take 800 mg by mouth every 8 (eight) hours as needed for mild pain.   Yes Historical Provider, MD   No Known Allergies  FAMILY HISTORY:  No family history on file. SOCIAL HISTORY:  reports that he has never smoked. He does not have any smokeless tobacco history on file. He reports that he does not drink alcohol or use illicit drugs.  REVIEW OF SYSTEMS:   Gen: Denies fever, + chills, weight change, + fatigue, night sweats HEENT: Denies blurred vision, double vision, hearing loss, tinnitus, sinus congestion, rhinorrhea, sore throat, neck stiffness, dysphagia PULM: Denies shortness of breath, cough, sputum production, hemoptysis, wheezing CV: Denies chest pain, edema, orthopnea, paroxysmal nocturnal dyspnea, palpitations GI: Denies abdominal pain, nausea, vomiting, diarrhea, hematochezia, melena, constipation, change in bowel habits GU: Denies dysuria, hematuria, polyuria, oliguria, urethral discharge Endocrine: Denies hot or cold intolerance, polyuria, polyphagia or appetite change Derm: per HPI Heme: Denies easy bruising, bleeding, bleeding gums Neuro: Denies headache, numbness, weakness, slurred speech, loss of memory or consciousness   SUBJECTIVE:   VITAL SIGNS: Temp:  [97.8 F (36.6 C)-98.8 F (37.1 C)] 97.8 F (36.6 C) (01/01 0358) Pulse Rate:  [60-122] 66 (01/01 0700) Resp:  [18-28] 22 (01/01 0700) BP: (68-119)/(28-74) 77/49 mmHg (01/01 0700) SpO2:  [92 %-100 %] 100 % (01/01 0700) Weight:  [110.179 kg (242  lb 14.4 oz)] 110.179 kg (242 lb 14.4 oz) (01/01 0430) HEMODYNAMICS:   VENTILATOR SETTINGS:   INTAKE / OUTPUT: Intake/Output     12/31 0701 - 01/01 0700 01/01 0701 - 01/02 0700   P.O. 720    I.V. (mL/kg) 2703 (24.5) 76.3 (0.7)   IV Piggyback 2925 1000   Total Intake(mL/kg) 6348 (57.6) 1076.3 (9.8)   Urine (mL/kg/hr) 725 (0.3)      Total Output 725     Net +5623 +1076.3        Stool Occurrence 6 x      PHYSICAL EXAMINATION:  Gen: well appearing, no acute distress HEENT: NCAT, PERRL, EOMi, OP clear, neck supple without masses (no redness or warmth) PULM: Few crackles in bases CV: RRR, no mgr, + JVD AB: BS+, soft, nontender, no hsm Ext: warm, no edema, no clubbing, no cyanosis Derm: erythematous, tender rash throughout left pectoralis (lateral aspect) extending down to mid thigh on left.  No fluctuance, no crepitance Neuro: A&Ox4, CN II-XII intact, MAEW   LABS:  CBC  Recent Labs Lab 07/29/13 1215 07/30/13 0446 07/31/13 0425  WBC 7.1 15.5* 25.6*  HGB 14.9 12.8* 12.0*  HCT 41.7 35.0* 32.9*  PLT 156 136* 108*   Coag's  Recent Labs Lab 07/28/13 1911 07/29/13 0300  APTT  --  46*  INR 1.10 1.25   BMET  Recent Labs Lab 07/29/13 1215 07/29/13 2057 07/30/13 0446  NA 133* 130* 130*  K 3.8 3.4* 3.2*  CL 95* 92* 89*  CO2 13* 16* 20  BUN 56* 64* 69*  CREATININE 4.29* 4.72* 5.15*  GLUCOSE 111* 192* 121*   Electrolytes  Recent Labs Lab 07/29/13 1215 07/29/13 2057 07/30/13 0446  CALCIUM 7.2* 6.6* 6.8*   Sepsis Markers  Recent Labs Lab 07/28/13 1918 07/29/13 0520 07/29/13 1215 07/31/13 0425  LATICACIDVEN 6.21* 3.5*  --   --   PROCALCITON  --   --  54.96 48.97   ABG No results found for this basename: PHART, PCO2ART, PO2ART,  in the last 168 hours Liver Enzymes  Recent Labs Lab 07/28/13 1911 07/29/13 0300 07/30/13 0446  AST 41* 39* 50*  ALT 40 31 39  ALKPHOS 72 40 48  BILITOT 2.0* 1.5* 1.7*  ALBUMIN 3.2* 2.0* 1.6*   Cardiac Enzymes  Recent Labs Lab 07/28/13 1911 07/31/13 0425  TROPONINI <0.30 9.33*  PROBNP 500.6*  --    Glucose  Recent Labs Lab 07/28/13 2005  GLUCAP 149*     EKG: NSR, ST elevation inferior limb leads CXR: bibasilar atelectasis  ASSESSMENT / PLAN:  PULMONARY A:  Increased work of breathing with sepsis, but not in respiratory  distress Atelectasis P:   -monitor resp status closely in ICU -abg -O2 saturation monitoring -Sit up, incentive spiro  CARDIOVASCULAR A:  Septic shock from cellulitis/myositis STEMI Likely some degree of cardiogenic shock  P:  -CVL placed -monitor CVP -place arterial line -levophed for MAP > 65 guided by sepsis protocol -echo -asa, heparin per cardiology -check SvO2 -serial lactic acid  RENAL A:  AKI > u/a on 12/30 suggests ATN, consider rhabdomyolysis as well Metabolic acidosis improved with sodium bicarb P:   -continue aggressive hydration via sepsis protocol -check CK, urine myoglobin -BMET now -f/u renal recs 1/1  GASTROINTESTINAL A:  No acute issues P:   -NPO for now  HEMATOLOGIC A:  Thrombocytopenia, worrisome for DIC P:  -check DIC panel  INFECTIOUS A:  Abdominal wall cellulitis, myositis and group A strep bacteremia P:   -  continue Pen G and Clinda per ID recs -serial exams  ENDOCRINE A:  No acute issues P:   -monitor glucose  Musculoskeletal A: Neck pain on 1/1 > no redness or tenderness > myositis?, ischemia related? Muscle soreness?  P: -serial exams  NEUROLOGIC A:  Pain > abdominal wall, neck P:   -fentanyl PRN  Updated wife at bedside   I have personally obtained a history, examined the patient, evaluated laboratory and imaging results, formulated the assessment and plan and placed orders. CRITICAL CARE: The patient is critically ill with multiple organ systems failure and requires high complexity decision making for assessment and support, frequent evaluation and titration of therapies, application of advanced monitoring technologies and extensive interpretation of multiple databases. Critical Care Time devoted to patient care services described in this note is 45 minutes.   Yolonda Kida PCCM Pager: [phone] Cell: [phone] If no response, call [phone]   07/31/2013, 7:14 AM

## 2013-07-31 NOTE — Progress Notes (Signed)
CRITICAL VALUE ALERT  Critical value received:  Troponin 9.33  Date of notification:  07/31/13  Time of notification:  0640  Critical value read back:yes  Nurse who received alert:  Madalyn RobStowe, Haskel Dewalt Dawne   MD notified (1st page):  Dr. Shirlee LatchMcLean  Time of first page:  [phone]  MD notified (2nd page):  Time of second page:  Responding MD:  Dr. Shirlee LatchMcLean  Time MD responded:  [phone]

## 2013-08-01 LAB — DIFFERENTIAL
BASOS ABS: 0 10*3/uL (ref 0.0–0.1)
BASOS PCT: 0 % (ref 0–1)
Band Neutrophils: 0 % (ref 0–10)
Blasts: 0 %
Eosinophils Absolute: 0 10*3/uL (ref 0.0–0.7)
Eosinophils Relative: 0 % (ref 0–5)
Lymphocytes Relative: 10 % — ABNORMAL LOW (ref 12–46)
Lymphs Abs: 3.4 10*3/uL (ref 0.7–4.0)
Metamyelocytes Relative: 0 %
Monocytes Absolute: 1.3 10*3/uL — ABNORMAL HIGH (ref 0.1–1.0)
Monocytes Relative: 4 % (ref 3–12)
Myelocytes: 0 %
NEUTROS ABS: 28.9 10*3/uL — AB (ref 1.7–7.7)
Neutrophils Relative %: 86 % — ABNORMAL HIGH (ref 43–77)
Promyelocytes Absolute: 0 %
nRBC: 0 /100 WBC

## 2013-08-01 LAB — BASIC METABOLIC PANEL
BUN: 85 mg/dL — ABNORMAL HIGH (ref 6–23)
BUN: 88 mg/dL — AB (ref 6–23)
CALCIUM: 7 mg/dL — AB (ref 8.4–10.5)
CHLORIDE: 88 meq/L — AB (ref 96–112)
CO2: 23 mEq/L (ref 19–32)
CO2: 25 mEq/L (ref 19–32)
CREATININE: 5.67 mg/dL — AB (ref 0.50–1.35)
Calcium: 6.5 mg/dL — ABNORMAL LOW (ref 8.4–10.5)
Chloride: 90 mEq/L — ABNORMAL LOW (ref 96–112)
Creatinine, Ser: 5.74 mg/dL — ABNORMAL HIGH (ref 0.50–1.35)
GFR calc non Af Amer: 11 mL/min — ABNORMAL LOW (ref >90)
GFR calc non Af Amer: 11 mL/min — ABNORMAL LOW (ref >90)
GFR, EST AFRICAN AMERICAN: 13 mL/min — AB (ref >90)
GFR, EST AFRICAN AMERICAN: 13 mL/min — AB (ref >90)
Glucose, Bld: 149 mg/dL — ABNORMAL HIGH (ref 70–99)
Glucose, Bld: 112 mg/dL — ABNORMAL HIGH (ref 70–99)
POTASSIUM: 2.9 meq/L — AB (ref 3.7–5.3)
Potassium: 3.4 mEq/L — ABNORMAL LOW (ref 3.7–5.3)
Sodium: 130 mEq/L — ABNORMAL LOW (ref 137–147)
Sodium: 131 mEq/L — ABNORMAL LOW (ref 137–147)

## 2013-08-01 LAB — CBC
HEMATOCRIT: 34.8 % — AB (ref 39.0–52.0)
HEMOGLOBIN: 12.8 g/dL — AB (ref 13.0–17.0)
MCH: 30.6 pg (ref 26.0–34.0)
MCHC: 36.8 g/dL — AB (ref 30.0–36.0)
MCV: 83.3 fL (ref 78.0–100.0)
Platelets: 128 10*3/uL — ABNORMAL LOW (ref 150–400)
RBC: 4.18 MIL/uL — ABNORMAL LOW (ref 4.22–5.81)
RDW: 13.4 % (ref 11.5–15.5)
WBC: 33.6 10*3/uL — ABNORMAL HIGH (ref 4.0–10.5)

## 2013-08-01 LAB — VANCOMYCIN, RANDOM: Vancomycin Rm: 8.2 ug/mL

## 2013-08-01 LAB — GLUCOSE, CAPILLARY
GLUCOSE-CAPILLARY: 156 mg/dL — AB (ref 70–99)
GLUCOSE-CAPILLARY: 114 mg/dL — AB (ref 70–99)
GLUCOSE-CAPILLARY: 122 mg/dL — AB (ref 70–99)
Glucose-Capillary: 119 mg/dL — ABNORMAL HIGH (ref 70–99)

## 2013-08-01 LAB — MAGNESIUM: MAGNESIUM: 2.1 mg/dL (ref 1.5–2.5)

## 2013-08-01 LAB — HEPARIN LEVEL (UNFRACTIONATED): Heparin Unfractionated: 0.14 IU/mL — ABNORMAL LOW (ref 0.30–0.70)

## 2013-08-01 MED ORDER — INSULIN ASPART 100 UNIT/ML ~~LOC~~ SOLN
0.0000 [IU] | SUBCUTANEOUS | Status: DC
  Administered 2013-08-01 – 2013-08-02 (×2): 2 [IU] via SUBCUTANEOUS

## 2013-08-01 MED ORDER — HEPARIN BOLUS VIA INFUSION
3000.0000 [IU] | Freq: Once | INTRAVENOUS | Status: AC
  Administered 2013-08-01: 3000 [IU] via INTRAVENOUS
  Filled 2013-08-01: qty 3000

## 2013-08-01 MED ORDER — POTASSIUM CHLORIDE 10 MEQ/50ML IV SOLN
10.0000 meq | INTRAVENOUS | Status: AC
  Administered 2013-08-01: 10 meq via INTRAVENOUS
  Filled 2013-08-01 (×6): qty 50

## 2013-08-01 MED ORDER — OXEPA PO LIQD
1000.0000 mL | ORAL | Status: DC
  Administered 2013-08-01: 1000 mL
  Filled 2013-08-01 (×4): qty 1000

## 2013-08-01 MED ORDER — MIDAZOLAM HCL 2 MG/2ML IJ SOLN
2.0000 mg | INTRAMUSCULAR | Status: DC | PRN
  Administered 2013-08-02: 2 mg via INTRAVENOUS
  Filled 2013-08-01 (×2): qty 2

## 2013-08-01 MED ORDER — POTASSIUM CHLORIDE 20 MEQ/15ML (10%) PO LIQD
20.0000 meq | Freq: Once | ORAL | Status: AC
  Administered 2013-08-01: 20 meq
  Filled 2013-08-01: qty 15

## 2013-08-01 MED ORDER — POTASSIUM CHLORIDE 10 MEQ/50ML IV SOLN
10.0000 meq | INTRAVENOUS | Status: AC
  Administered 2013-08-01 (×2): 10 meq via INTRAVENOUS

## 2013-08-01 MED ORDER — PRO-STAT SUGAR FREE PO LIQD
30.0000 mL | Freq: Two times a day (BID) | ORAL | Status: DC
  Administered 2013-08-01 – 2013-08-02 (×2): 30 mL
  Filled 2013-08-01 (×8): qty 30

## 2013-08-01 MED ORDER — FUROSEMIDE 10 MG/ML IJ SOLN
80.0000 mg | Freq: Four times a day (QID) | INTRAMUSCULAR | Status: DC
  Administered 2013-08-01 – 2013-08-02 (×4): 80 mg via INTRAVENOUS
  Filled 2013-08-01 (×6): qty 8

## 2013-08-01 MED ORDER — FUROSEMIDE 10 MG/ML IJ SOLN: 80.0000 mg | Freq: Three times a day (TID) | INTRAMUSCULAR | Status: DC

## 2013-08-01 MED ORDER — PIPERACILLIN-TAZOBACTAM IN DEX 2-0.25 GM/50ML IV SOLN
2.2500 g | Freq: Four times a day (QID) | INTRAVENOUS | Status: DC
  Administered 2013-08-01 – 2013-08-03 (×8): 2.25 g via INTRAVENOUS
  Filled 2013-08-01 (×10): qty 50

## 2013-08-01 NOTE — Progress Notes (Addendum)
Patient ID: Joetta Manners, male   DOB: 01/14/1971, 43 y.o.   MRN: [id number]    SUBJECTIVE: Patient intubated yesterday and started on norepinephrine.  BP stable now.  Troponin increased to > 20.  Echo showed preserved EF and wall motion.  CVP 12-16.  ST elevation resolved with improved BP.   Marland Kitchen antiseptic oral rinse  15 mL Mouth Rinse QID  . aspirin  81 mg Oral Daily  . atorvastatin  20 mg Oral q1800  . chlorhexidine  15 mL Mouth/Throat BID  . clindamycin (CLEOCIN) IV  600 mg Intravenous Q8H  . docusate sodium  100 mg Oral BID  . lidocaine  2 patch Transdermal Q24H  . pantoprazole (PROTONIX) IV  40 mg Intravenous Daily  . pencillin G potassium IV  4 Million Units Intravenous Q8H  . polyethylene glycol  17 g Oral BID  . potassium chloride  10 mEq Intravenous Q1 Hr x 3  . sodium chloride  3 mL Intravenous Q12H  Propofol gtt Heparin gtt Norepinephrine @ 18    Filed Vitals:   08/01/13 0338 08/01/13 0400 08/01/13 0500 08/01/13 0600  BP: 132/72     Pulse: 90 89 88 88  Temp:  98.3 F (36.8 C)    TempSrc:  Oral    Resp: 28 28 28 28   Height:      Weight:      SpO2: 99% 100% 100% 98%    Intake/Output Summary (Last 24 hours) at 08/01/13 0742 Last data filed at 08/01/13 0615  Gross per 24 hour  Intake 7206.48 ml  Output   1355 ml  Net 5851.48 ml    LABS: Basic Metabolic Panel:  Recent Labs  14/78/29 0425 08/01/13 0440  NA 131* 130*  K 3.2* 2.9*  CL 87* 88*  CO2 21 23  GLUCOSE 113* 149*  BUN 83* 85*  CREATININE 5.89* 5.74*  CALCIUM 7.0* 6.5*  MG  --  2.1   Liver Function Tests:  Recent Labs  07/30/13 0446 07/31/13 0425  AST 50* 69*  ALT 39 39  ALKPHOS 48 87  BILITOT 1.7* 2.2*  PROT 5.0* 5.1*  ALBUMIN 1.6* 1.4*   No results found for this basename: LIPASE, AMYLASE,  in the last 72 hours CBC:  Recent Labs  07/31/13 0425 08/01/13 0340  WBC 25.6* 33.6*  HGB 12.0* 12.8*  HCT 32.9* 34.8*  MCV 82.3 83.3  PLT 108* 128*   Cardiac Enzymes:  Recent Labs  07/30/13 0446 07/31/13 0425 07/31/13 0915 07/31/13 1625  CKTOTAL 1565* 903*  --   --   TROPONINI  --  9.33* >20.00* >20.00*   BNP: No components found with this basename: POCBNP,  D-Dimer:  Recent Labs  07/31/13 0915  DDIMER 12.62*   Hemoglobin A1C: No results found for this basename: HGBA1C,  in the last 72 hours Fasting Lipid Panel: No results found for this basename: CHOL, HDL, LDLCALC, TRIG, CHOLHDL, LDLDIRECT,  in the last 72 hours Thyroid Function Tests: No results found for this basename: TSH, T4TOTAL, FREET3, T3FREE, THYROIDAB,  in the last 72 hours Anemia Panel: No results found for this basename: VITAMINB12, FOLATE, FERRITIN, TIBC, IRON, RETICCTPCT,  in the last 72 hours  RADIOLOGY: Ct Abdomen Pelvis Wo Contrast  07/31/2013   CLINICAL DATA:  Evaluate for gas in the right flank.  EXAM: CT CHEST, ABDOMEN AND PELVIS WITHOUT CONTRAST  TECHNIQUE: Multidetector CT imaging of the chest, abdomen and pelvis was performed following the standard protocol without IV contrast.  COMPARISON:  No priors.  FINDINGS: CT CHEST FINDINGS  Mediastinum: Left internal jugular central venous catheter with tip terminating in the mid superior vena cava. Endotracheal tube with tip in the upper trachea shortly above the level of the aortic arch. Nasogastric tube in the body of the stomach. Heart size is borderline enlarged. Trace amount of pericardial fluid or thickening, unlikely to be of hemodynamic significance at this time. No associated pericardial calcification. No pathologically enlarged mediastinal or hilar lymph nodes. Please note that accurate exclusion of hilar adenopathy is limited on noncontrast CT scans. Esophagus is unremarkable in appearance.  Lungs/Pleura: Small bilateral pleural effusions (right greater than left) layering dependently with some associated passive atelectasis throughout the lower lobes of the lungs bilaterally. Some superimposed airspace consolidation in the lower lobes of  the lungs bilaterally is also suspected, potentially related to mild aspiration. Several foci of peribronchovascular ground-glass attenuation are also noted in the right middle lobe, which could represent some endobronchial spread of infection/inflammation. No other suspicious appearing pulmonary nodules or masses are noted. No pneumothorax.  Musculoskeletal: There are no aggressive appearing lytic or blastic lesions noted in the visualized portions of the skeleton.  CT ABDOMEN AND PELVIS FINDINGS  Abdomen/Pelvis: Multiple nonobstructive calculi are noted within the collecting systems of the kidneys bilaterally, largest of which is in the lower pole collecting system of the left kidney measuring 1 cm. No ureteral stones or findings of urinary tract obstruction are noted at this time. The unenhanced appearance of the kidneys is otherwise unremarkable bilaterally. Several sub cm low-attenuation hepatic lesions are noted, but are incompletely characterized on today's non contrast CT examination. Diffuse decreased attenuation throughout the hepatic parenchyma, indicative of hepatic steatosis. The unenhanced appearance of the gallbladder, pancreas, spleen and bilateral adrenal glands is unremarkable.  Trace volume of free fluid in the pelvis. No larger volume of ascites. No pneumoperitoneum. No pathologic distention of small bowel. No definite lymphadenopathy identified within the abdomen or pelvis on today's non contrast CT examination. Prostate gland and urinary bladder are unremarkable in appearance. A Foley balloon catheter is present with tip in the lumen of the urinary bladder.  Musculoskeletal: Diffuse body wall edema. No definite gas identified within the soft tissues of the right flank. There are no aggressive appearing lytic or blastic lesions noted in the visualized portions of the skeleton.  IMPRESSION: 1. No subcutaneous or intramuscular gas identified within the soft tissues of the right flank. 2. Diffuse  body wall edema with small bilateral pleural effusions, trace ascites and trace amount of pericardial fluid, likely to reflect a state of anasarca. 3. In addition to the areas of passive atelectasis in the lower lobes of the lungs bilaterally there is likely some superimposed airspace consolidation, as well as small foci of airspace disease in the right middle lobe, concerning for sequela of aspiration. 4. Hepatic steatosis. 5. Nonobstructive calculi within the collecting systems of the kidneys bilaterally, largest of which measures 1 cm in the lower pole of the left kidney. No ureteral stones or findings of urinary tract obstruction at this time. 6. Support apparatus, as above.   Electronically Signed   By: Trudie Reed M.D.   On: 07/31/2013 23:08   Ct Abdomen Pelvis Wo Contrast  07/29/2013   CLINICAL DATA:  Severe pain in the right side. No injury. Redness and swelling of the skin with suspected cellulitis. Renal failure.  EXAM: CT CHEST WITHOUT CONTRAST; CT ABDOMEN AND PELVIS WITHOUT CONTRAST  TECHNIQUE: Multidetector CT imaging of the chest was  performed following the standard protocol without IV contrast.; Multidetector CT imaging of the abdomen and pelvis was performed following the standard protocol without intravenous contrast.  COMPARISON:  None.  FINDINGS: CT chest: There is extensive infiltration in the subcutaneous fat and superficial muscle layers involving the right shoulder, right axilla, right lateral chest wall, right abdominal wall and right flank extending posteriorly to the level of the midline over the spinous process. Changes extend from the shoulder down to the right groin. This is consistent with extensive cellulitis, edema, and/or contusion. There is no discrete fluid collection that would suggest evidence of a focal abscess. Fluid and edema is demonstrated throughout the area and in between the muscle layers.  Normal heart size. Normal caliber thoracic aorta. No significant  lymphadenopathy in the chest. Atelectasis in both lung bases. No pneumothorax or effusion. Esophagus is decompressed. Esophageal diverticulum may be present. Normal alignment of the thoracic spine. Sternum and ribs appear intact.  CT abdomen and pelvis: Right flank and abdominal wall changes as previously described above. Diffuse fatty infiltration of the liver. Multiple stones demonstrated in both kidneys with stones also demonstrated in the right renal pelvis. No evidence of pyelocaliectasis or ureterectasis. No ureteral stones are visualized. Small accessory spleen. The gallbladder demonstrates somewhat diffuse increased density throughout, suggesting sludge or milk of calcium. The unenhanced appearance of the spleen, pancreas, adrenal glands, abdominal aorta, inferior vena cava, and retroperitoneal lymph nodes is unremarkable the stomach, small bowel, and colon are not abnormally distended. No free air or free fluid in the abdomen. No infiltration in the intra-abdominal fat.  Pelvis: Prostate gland is not enlarged. Bladder wall is not thickened. No free or loculated pelvic fluid collections. The appendix is normal. No evidence of diverticulitis. Mild degenerative changes in the lumbar spine with normal alignment. Pelvis, sacrum thumb and hips appear intact. Benign-appearing area of sclerosis in the right hemipelvis.  IMPRESSION: Extensive subcutaneous soft tissue and intramuscular cellulitis, infiltration/edema, or contusion along the right side of the chest and abdomen wall extending from the level of the shoulder to the pelvis. No discrete abscess is identified.   Electronically Signed   By: Burman Nieves M.D.   On: 07/29/2013 04:37   Dg Chest 2 View  07/30/2013   CLINICAL DATA:  Pain.  EXAM: CHEST  2 VIEW  COMPARISON:  Chest x-ray 07/30/2013.  Chest CT 07/29/2013.  FINDINGS: Prominent bibasilar atelectasis and/or pneumonia noted. No pneumothorax. Stable cardiomegaly, no pulmonary venous distention or  prominent pleural effusion. Mild pleural thickening present. No acute osseous abnormality. Degenerative changes thoracic spine.  IMPRESSION: Dense bibasilar atelectasis and/or infiltrates.   Electronically Signed   By: Maisie Fus  Register   On: 07/30/2013 14:09   Dg Abd 1 View  07/30/2013   CLINICAL DATA:  Pain.  EXAM: ABDOMEN - 1 VIEW  COMPARISON:  CT 07/29/2013.  FINDINGS: As noted on CT diffuse right side secondary soft tissue swelling is present. Air-filled nondilated loops of small and large bowel noted. This is a nonspecific finding. Bilateral nephrolithiasis. No acute bony abnormality. Sclerotic density noted in the right ilium most consistent with bone island. No other sclerotic bony densities noted to suggest metastatic disease.  IMPRESSION: 1. Soft tissue swelling of the right abdomen subcutaneous tissue, reference made to recent CT report. 2. Bilateral nephrolithiasis. 3. Nonspecific nondistended air-filled loops of small large bowel noted. 4. Sclerotic density node in the right iliac wing consistent with bone island .   Electronically Signed   By: Maisie Fus  Register  On: 07/30/2013 13:33   Ct Chest Wo Contrast  07/31/2013   CLINICAL DATA:  Evaluate for gas in the right flank.  EXAM: CT CHEST, ABDOMEN AND PELVIS WITHOUT CONTRAST  TECHNIQUE: Multidetector CT imaging of the chest, abdomen and pelvis was performed following the standard protocol without IV contrast.  COMPARISON:  No priors.  FINDINGS: CT CHEST FINDINGS  Mediastinum: Left internal jugular central venous catheter with tip terminating in the mid superior vena cava. Endotracheal tube with tip in the upper trachea shortly above the level of the aortic arch. Nasogastric tube in the body of the stomach. Heart size is borderline enlarged. Trace amount of pericardial fluid or thickening, unlikely to be of hemodynamic significance at this time. No associated pericardial calcification. No pathologically enlarged mediastinal or hilar lymph nodes.  Please note that accurate exclusion of hilar adenopathy is limited on noncontrast CT scans. Esophagus is unremarkable in appearance.  Lungs/Pleura: Small bilateral pleural effusions (right greater than left) layering dependently with some associated passive atelectasis throughout the lower lobes of the lungs bilaterally. Some superimposed airspace consolidation in the lower lobes of the lungs bilaterally is also suspected, potentially related to mild aspiration. Several foci of peribronchovascular ground-glass attenuation are also noted in the right middle lobe, which could represent some endobronchial spread of infection/inflammation. No other suspicious appearing pulmonary nodules or masses are noted. No pneumothorax.  Musculoskeletal: There are no aggressive appearing lytic or blastic lesions noted in the visualized portions of the skeleton.  CT ABDOMEN AND PELVIS FINDINGS  Abdomen/Pelvis: Multiple nonobstructive calculi are noted within the collecting systems of the kidneys bilaterally, largest of which is in the lower pole collecting system of the left kidney measuring 1 cm. No ureteral stones or findings of urinary tract obstruction are noted at this time. The unenhanced appearance of the kidneys is otherwise unremarkable bilaterally. Several sub cm low-attenuation hepatic lesions are noted, but are incompletely characterized on today's non contrast CT examination. Diffuse decreased attenuation throughout the hepatic parenchyma, indicative of hepatic steatosis. The unenhanced appearance of the gallbladder, pancreas, spleen and bilateral adrenal glands is unremarkable.  Trace volume of free fluid in the pelvis. No larger volume of ascites. No pneumoperitoneum. No pathologic distention of small bowel. No definite lymphadenopathy identified within the abdomen or pelvis on today's non contrast CT examination. Prostate gland and urinary bladder are unremarkable in appearance. A Foley balloon catheter is present  with tip in the lumen of the urinary bladder.  Musculoskeletal: Diffuse body wall edema. No definite gas identified within the soft tissues of the right flank. There are no aggressive appearing lytic or blastic lesions noted in the visualized portions of the skeleton.  IMPRESSION: 1. No subcutaneous or intramuscular gas identified within the soft tissues of the right flank. 2. Diffuse body wall edema with small bilateral pleural effusions, trace ascites and trace amount of pericardial fluid, likely to reflect a state of anasarca. 3. In addition to the areas of passive atelectasis in the lower lobes of the lungs bilaterally there is likely some superimposed airspace consolidation, as well as small foci of airspace disease in the right middle lobe, concerning for sequela of aspiration. 4. Hepatic steatosis. 5. Nonobstructive calculi within the collecting systems of the kidneys bilaterally, largest of which measures 1 cm in the lower pole of the left kidney. No ureteral stones or findings of urinary tract obstruction at this time. 6. Support apparatus, as above.   Electronically Signed   By: Trudie Reedaniel  Entrikin M.D.   On: 07/31/2013  23:08   Ct Chest Wo Contrast  07/29/2013   CLINICAL DATA:  Severe pain in the right side. No injury. Redness and swelling of the skin with suspected cellulitis. Renal failure.  EXAM: CT CHEST WITHOUT CONTRAST; CT ABDOMEN AND PELVIS WITHOUT CONTRAST  TECHNIQUE: Multidetector CT imaging of the chest was performed following the standard protocol without IV contrast.; Multidetector CT imaging of the abdomen and pelvis was performed following the standard protocol without intravenous contrast.  COMPARISON:  None.  FINDINGS: CT chest: There is extensive infiltration in the subcutaneous fat and superficial muscle layers involving the right shoulder, right axilla, right lateral chest wall, right abdominal wall and right flank extending posteriorly to the level of the midline over the spinous  process. Changes extend from the shoulder down to the right groin. This is consistent with extensive cellulitis, edema, and/or contusion. There is no discrete fluid collection that would suggest evidence of a focal abscess. Fluid and edema is demonstrated throughout the area and in between the muscle layers.  Normal heart size. Normal caliber thoracic aorta. No significant lymphadenopathy in the chest. Atelectasis in both lung bases. No pneumothorax or effusion. Esophagus is decompressed. Esophageal diverticulum may be present. Normal alignment of the thoracic spine. Sternum and ribs appear intact.  CT abdomen and pelvis: Right flank and abdominal wall changes as previously described above. Diffuse fatty infiltration of the liver. Multiple stones demonstrated in both kidneys with stones also demonstrated in the right renal pelvis. No evidence of pyelocaliectasis or ureterectasis. No ureteral stones are visualized. Small accessory spleen. The gallbladder demonstrates somewhat diffuse increased density throughout, suggesting sludge or milk of calcium. The unenhanced appearance of the spleen, pancreas, adrenal glands, abdominal aorta, inferior vena cava, and retroperitoneal lymph nodes is unremarkable the stomach, small bowel, and colon are not abnormally distended. No free air or free fluid in the abdomen. No infiltration in the intra-abdominal fat.  Pelvis: Prostate gland is not enlarged. Bladder wall is not thickened. No free or loculated pelvic fluid collections. The appendix is normal. No evidence of diverticulitis. Mild degenerative changes in the lumbar spine with normal alignment. Pelvis, sacrum thumb and hips appear intact. Benign-appearing area of sclerosis in the right hemipelvis.  IMPRESSION: Extensive subcutaneous soft tissue and intramuscular cellulitis, infiltration/edema, or contusion along the right side of the chest and abdomen wall extending from the level of the shoulder to the pelvis. No discrete  abscess is identified.   Electronically Signed   By: Burman Nieves M.D.   On: 07/29/2013 04:37   US Renal  07/30/2013   CLINICAL DATA:  Acute renal insufficiency.  Elevated creatinine.  EXAM: RENAL/URINARY TRACT ULTRASOUND COMPLETE  COMPARISON:  CT 07/29/2013.  FINDINGS: Right Kidney:  Length: 12.3 cm. Echogenicity within normal limits. No mass or hydronephrosis visualized.  Left Kidney:  Length: 13.1 cm. Echogenicity within normal limits. 6 mm stone over the lower pole. No mass or hydronephrosis visualized.  Bladder:  Decompressed as Foley catheter is present.  IMPRESSION: Normal size kidneys without evidence of hydronephrosis.  6 mm stone over the left lower pole.   Electronically Signed   By: Elberta Fortis M.D.   On: 07/30/2013 21:54   Portable Chest Xray  07/31/2013   CLINICAL DATA:  Intubation.  EXAM: PORTABLE CHEST - 1 VIEW  COMPARISON:  Earlier the same date.  FINDINGS: 1418 hr. The endotracheal tube is well positioned in the midtrachea. A nasogastric tube projects below the diaphragm. Left IJ central venous catheter is unchanged at the level  of the mid SVC. There is persistent pulmonary edema with increased opacity at the left lung base, likely due to a combination of pleural fluid and basilar airspace disease. No pneumothorax is evident. The heart size and mediastinal contours are stable.  IMPRESSION: Satisfactory position of the newly placed endotracheal and nasogastric tubes. Increased left basilar airspace disease and probable adjacent pleural fluid.   Electronically Signed   By: Roxy Horseman M.D.   On: 07/31/2013 14:34   Dg Chest Port 1 View  07/31/2013   CLINICAL DATA:  Shortness of breath  EXAM: PORTABLE CHEST - 1 VIEW  COMPARISON:  07/31/2013  FINDINGS: Left-sided jugular central venous catheter in unchanged position. Bilateral interstitial and alveolar airspace opacities with bilateral central pulmonary vascular prominence. No pleural effusions or pneumothorax. Stable cardiomediastinal  silhouette. Unremarkable osseous structures.  IMPRESSION: Bilateral interstitial and alveolar airspace opacities as can be seen with an infectious or inflammatory process versus pulmonary edema.   Electronically Signed   By: Elige Ko   On: 07/31/2013 11:38   Dg Chest Port 1 View  07/31/2013   CLINICAL DATA:  Shortness of breath, status post line placement  EXAM: PORTABLE CHEST - 1 VIEW  COMPARISON:  07/30/2013  FINDINGS: There is a left-sided jugular central venous catheter with the tip projecting over the confluence of the left brachiocephalic and SVC. There are bilateral interstitial and alveolar airspace opacities primarily at the lung bases. There is no pleural effusion or pneumothorax. Stable cardiomediastinal silhouette. The osseous structures are unremarkable.  IMPRESSION: 1. Left-sided jugular central venous catheter with the tip projecting over the confluence of the left brachiocephalic and SVC. 2. Bilateral interstitial and alveolar airspace opacities as can be seen with an infectious or inflammatory etiology versus interstitial edema.   Electronically Signed   By: Elige Ko   On: 07/31/2013 07:59   Dg Chest Port 1 View  07/30/2013   CLINICAL DATA:  Shortness of breath  EXAM: PORTABLE CHEST - 1 VIEW  COMPARISON:  07/28/2013  FINDINGS: The heart size and mediastinal contours are within normal limits. Both lungs are clear. The visualized skeletal structures are unremarkable.  IMPRESSION: No active disease.   Electronically Signed   By: Alcide Clever M.D.   On: 07/30/2013 08:13   Dg Chest Portable 1 View  07/28/2013   CLINICAL DATA:  Chest pain  EXAM: PORTABLE CHEST - 1 VIEW  COMPARISON:  None.  FINDINGS: The heart size and mediastinal contours are within normal limits. There is no focal infiltrate, pulmonary edema, or pleural effusion. The visualized skeletal structures are unremarkable.  IMPRESSION: No active disease.   Electronically Signed   By: Sherian Rein M.D.   On: 07/28/2013 19:37     PHYSICAL EXAM General: intubated, sedated  Lungs: Decreased breath sounds at bases.  CV: Nondisplaced PMI.  Heart regular S1/S2, no S3/S4, no murmur.  No peripheral edema.   Abdomen: Soft,  no hepatosplenomegaly, no distention.  Extremities: No clubbing or cyanosis.  Skin: Erythma right flank.  TELEMETRY: Reviewed telemetry pt in [facility]  ASSESSMENT AND PLAN: 43 yo with minimal PMH presented with extensive right chest wall and abdominal wall cellulitis/myositis with GAS bacteremia. Yesterday, he developed hypotension and ECG changes and was intubated and started on pressors.  1. ID: GAS bacteremia with extensive cellulitis/myositis right chest wall/abdominal wall. Septic shock now on norepinephrine. 2. AKI: Suspect this is due to septic shock + extensive NSAID use at home. UOP improved with higher BP.  3. Cardiac: Elevated TnI  and ST elevation in the setting of septic shock. No chest pain. Patient does not have risk factors for CAD. Echo done yesterday showed normal EF and no wall motion abnormalities.  The elevation in troponin may all represent demand ischemia with potential septic cardiomyopathy.  ST elevation has resolved on telemetry with improvement in BP.  Will repeat ECG today.  Would continue heparin gtt today.  Would hold off on cath lab with septic shock on pressors with AKI.   Marca Ancona 08/01/2013 7:47 AM  ECG reviewed.  ST elevation has completely resolved, no Qs.    Marca Ancona 08/01/2013 9:10 AM

## 2013-08-01 NOTE — Progress Notes (Signed)
Assessment:  1 Oliguric AKI patient with rising creatinine from 3.8 on admission to 5.74 today (baseline Cr unknown), due to septic shock (with possible contribution from acute NSAID use, less likely rhabdo).  2 Septic shock, due to Group A strep bacteremia and cellulitis/myositis.  3 Metabolic Acidosis  improving  4 hypokalemia, hyponatremia, hypoalbuminemia  5 Elevated troponin and EKG changes   REC:  1 Diuretic challenge 2 Dialysis if diuretics not effective   3 K replace 4 His AKI does not preclude cardiac cath in my opinion  Subjective: Interval History: Intubated  Objective: Vital signs in last 24 hours: Temp:  [97 F (36.1 C)-98.3 F (36.8 C)] 98.3 F (36.8 C) (01/02 0400) Pulse Rate:  [71-124] 89 (01/02 0829) Resp:  [14-34] 28 (01/02 0829) BP: (85-165)/(41-84) 119/66 mmHg (01/02 0829) SpO2:  [90 %-100 %] 100 % (01/02 0829) Arterial Line BP: (85-152)/(52-83) 125/69 mmHg (01/02 0700) FiO2 (%):  [50 %-100 %] 50 % (01/02 0829) Weight change:   Intake/Output from previous Bacci: 01/01 0701 - 01/02 0700 In: 8428.5 [I.V.:3428.5; NG/GT:150; IV Piggyback:4850] Out: 1355 [Urine:1155; Emesis/NG output:200] Intake/Output this shift:    General appearance: alert, cooperative and intubated Chest wall: right sided chest wall tenderness Cardio: regular rate and rhythm, S1, S2 normal, no murmur, click, rub or gallop GI: firm but not tender Extremities: edema right thigh edema and sl tender improved  Lab Results:  Recent Labs  07/31/13 0425 08/01/13 0340  WBC 25.6* 33.6*  HGB 12.0* 12.8*  HCT 32.9* 34.8*  PLT 108* 128*   BMET:  Recent Labs  07/31/13 0425 08/01/13 0440  NA 131* 130*  K 3.2* 2.9*  CL 87* 88*  CO2 21 23  GLUCOSE 113* 149*  BUN 83* 85*  CREATININE 5.89* 5.74*  CALCIUM 7.0* 6.5*   No results found for this basename: PTH,  in the last 72 hours Iron Studies: No results found for this basename: IRON, TIBC, TRANSFERRIN, FERRITIN,  in the last 72  hours Studies/Results: Ct Abdomen Pelvis Wo Contrast  07/31/2013   CLINICAL DATA:  Evaluate for gas in the right flank.  EXAM: CT CHEST, ABDOMEN AND PELVIS WITHOUT CONTRAST  TECHNIQUE: Multidetector CT imaging of the chest, abdomen and pelvis was performed following the standard protocol without IV contrast.  COMPARISON:  No priors.  FINDINGS: CT CHEST FINDINGS  Mediastinum: Left internal jugular central venous catheter with tip terminating in the mid superior vena cava. Endotracheal tube with tip in the upper trachea shortly above the level of the aortic arch. Nasogastric tube in the body of the stomach. Heart size is borderline enlarged. Trace amount of pericardial fluid or thickening, unlikely to be of hemodynamic significance at this time. No associated pericardial calcification. No pathologically enlarged mediastinal or hilar lymph nodes. Please note that accurate exclusion of hilar adenopathy is limited on noncontrast CT scans. Esophagus is unremarkable in appearance.  Lungs/Pleura: Small bilateral pleural effusions (right greater than left) layering dependently with some associated passive atelectasis throughout the lower lobes of the lungs bilaterally. Some superimposed airspace consolidation in the lower lobes of the lungs bilaterally is also suspected, potentially related to mild aspiration. Several foci of peribronchovascular ground-glass attenuation are also noted in the right middle lobe, which could represent some endobronchial spread of infection/inflammation. No other suspicious appearing pulmonary nodules or masses are noted. No pneumothorax.  Musculoskeletal: There are no aggressive appearing lytic or blastic lesions noted in the visualized portions of the skeleton.  CT ABDOMEN AND PELVIS FINDINGS  Abdomen/Pelvis: Multiple  nonobstructive calculi are noted within the collecting systems of the kidneys bilaterally, largest of which is in the lower pole collecting system of the left kidney measuring  1 cm. No ureteral stones or findings of urinary tract obstruction are noted at this time. The unenhanced appearance of the kidneys is otherwise unremarkable bilaterally. Several sub cm low-attenuation hepatic lesions are noted, but are incompletely characterized on today's non contrast CT examination. Diffuse decreased attenuation throughout the hepatic parenchyma, indicative of hepatic steatosis. The unenhanced appearance of the gallbladder, pancreas, spleen and bilateral adrenal glands is unremarkable.  Trace volume of free fluid in the pelvis. No larger volume of ascites. No pneumoperitoneum. No pathologic distention of small bowel. No definite lymphadenopathy identified within the abdomen or pelvis on today's non contrast CT examination. Prostate gland and urinary bladder are unremarkable in appearance. A Foley balloon catheter is present with tip in the lumen of the urinary bladder.  Musculoskeletal: Diffuse body wall edema. No definite gas identified within the soft tissues of the right flank. There are no aggressive appearing lytic or blastic lesions noted in the visualized portions of the skeleton.  IMPRESSION: 1. No subcutaneous or intramuscular gas identified within the soft tissues of the right flank. 2. Diffuse body wall edema with small bilateral pleural effusions, trace ascites and trace amount of pericardial fluid, likely to reflect a state of anasarca. 3. In addition to the areas of passive atelectasis in the lower lobes of the lungs bilaterally there is likely some superimposed airspace consolidation, as well as small foci of airspace disease in the right middle lobe, concerning for sequela of aspiration. 4. Hepatic steatosis. 5. Nonobstructive calculi within the collecting systems of the kidneys bilaterally, largest of which measures 1 cm in the lower pole of the left kidney. No ureteral stones or findings of urinary tract obstruction at this time. 6. Support apparatus, as above.   Electronically  Signed   By: Trudie Reed M.D.   On: 07/31/2013 23:08   Dg Chest 2 View  07/30/2013   CLINICAL DATA:  Pain.  EXAM: CHEST  2 VIEW  COMPARISON:  Chest x-ray 07/30/2013.  Chest CT 07/29/2013.  FINDINGS: Prominent bibasilar atelectasis and/or pneumonia noted. No pneumothorax. Stable cardiomegaly, no pulmonary venous distention or prominent pleural effusion. Mild pleural thickening present. No acute osseous abnormality. Degenerative changes thoracic spine.  IMPRESSION: Dense bibasilar atelectasis and/or infiltrates.   Electronically Signed   By: Maisie Fus  Register   On: 07/30/2013 14:09   Dg Abd 1 View  07/30/2013   CLINICAL DATA:  Pain.  EXAM: ABDOMEN - 1 VIEW  COMPARISON:  CT 07/29/2013.  FINDINGS: As noted on CT diffuse right side secondary soft tissue swelling is present. Air-filled nondilated loops of small and large bowel noted. This is a nonspecific finding. Bilateral nephrolithiasis. No acute bony abnormality. Sclerotic density noted in the right ilium most consistent with bone island. No other sclerotic bony densities noted to suggest metastatic disease.  IMPRESSION: 1. Soft tissue swelling of the right abdomen subcutaneous tissue, reference made to recent CT report. 2. Bilateral nephrolithiasis. 3. Nonspecific nondistended air-filled loops of small large bowel noted. 4. Sclerotic density node in the right iliac wing consistent with bone island .   Electronically Signed   By: Maisie Fus  Register   On: 07/30/2013 13:33   Ct Chest Wo Contrast  07/31/2013   CLINICAL DATA:  Evaluate for gas in the right flank.  EXAM: CT CHEST, ABDOMEN AND PELVIS WITHOUT CONTRAST  TECHNIQUE: Multidetector CT imaging of  the chest, abdomen and pelvis was performed following the standard protocol without IV contrast.  COMPARISON:  No priors.  FINDINGS: CT CHEST FINDINGS  Mediastinum: Left internal jugular central venous catheter with tip terminating in the mid superior vena cava. Endotracheal tube with tip in the upper trachea  shortly above the level of the aortic arch. Nasogastric tube in the body of the stomach. Heart size is borderline enlarged. Trace amount of pericardial fluid or thickening, unlikely to be of hemodynamic significance at this time. No associated pericardial calcification. No pathologically enlarged mediastinal or hilar lymph nodes. Please note that accurate exclusion of hilar adenopathy is limited on noncontrast CT scans. Esophagus is unremarkable in appearance.  Lungs/Pleura: Small bilateral pleural effusions (right greater than left) layering dependently with some associated passive atelectasis throughout the lower lobes of the lungs bilaterally. Some superimposed airspace consolidation in the lower lobes of the lungs bilaterally is also suspected, potentially related to mild aspiration. Several foci of peribronchovascular ground-glass attenuation are also noted in the right middle lobe, which could represent some endobronchial spread of infection/inflammation. No other suspicious appearing pulmonary nodules or masses are noted. No pneumothorax.  Musculoskeletal: There are no aggressive appearing lytic or blastic lesions noted in the visualized portions of the skeleton.  CT ABDOMEN AND PELVIS FINDINGS  Abdomen/Pelvis: Multiple nonobstructive calculi are noted within the collecting systems of the kidneys bilaterally, largest of which is in the lower pole collecting system of the left kidney measuring 1 cm. No ureteral stones or findings of urinary tract obstruction are noted at this time. The unenhanced appearance of the kidneys is otherwise unremarkable bilaterally. Several sub cm low-attenuation hepatic lesions are noted, but are incompletely characterized on today's non contrast CT examination. Diffuse decreased attenuation throughout the hepatic parenchyma, indicative of hepatic steatosis. The unenhanced appearance of the gallbladder, pancreas, spleen and bilateral adrenal glands is unremarkable.  Trace volume of  free fluid in the pelvis. No larger volume of ascites. No pneumoperitoneum. No pathologic distention of small bowel. No definite lymphadenopathy identified within the abdomen or pelvis on today's non contrast CT examination. Prostate gland and urinary bladder are unremarkable in appearance. A Foley balloon catheter is present with tip in the lumen of the urinary bladder.  Musculoskeletal: Diffuse body wall edema. No definite gas identified within the soft tissues of the right flank. There are no aggressive appearing lytic or blastic lesions noted in the visualized portions of the skeleton.  IMPRESSION: 1. No subcutaneous or intramuscular gas identified within the soft tissues of the right flank. 2. Diffuse body wall edema with small bilateral pleural effusions, trace ascites and trace amount of pericardial fluid, likely to reflect a state of anasarca. 3. In addition to the areas of passive atelectasis in the lower lobes of the lungs bilaterally there is likely some superimposed airspace consolidation, as well as small foci of airspace disease in the right middle lobe, concerning for sequela of aspiration. 4. Hepatic steatosis. 5. Nonobstructive calculi within the collecting systems of the kidneys bilaterally, largest of which measures 1 cm in the lower pole of the left kidney. No ureteral stones or findings of urinary tract obstruction at this time. 6. Support apparatus, as above.   Electronically Signed   By: Trudie Reedaniel  Entrikin M.D.   On: 07/31/2013 23:08   [place] Renal  07/30/2013   CLINICAL DATA:  Acute renal insufficiency.  Elevated creatinine.  EXAM: RENAL/URINARY TRACT ULTRASOUND COMPLETE  COMPARISON:  CT 07/29/2013.  FINDINGS: Right Kidney:  Length: 12.3 cm. Echogenicity  within normal limits. No mass or hydronephrosis visualized.  Left Kidney:  Length: 13.1 cm. Echogenicity within normal limits. 6 mm stone over the lower pole. No mass or hydronephrosis visualized.  Bladder:  Decompressed as Foley catheter is  present.  IMPRESSION: Normal size kidneys without evidence of hydronephrosis.  6 mm stone over the left lower pole.   Electronically Signed   By: Elberta Fortis M.D.   On: 07/30/2013 21:54   Portable Chest Xray  07/31/2013   CLINICAL DATA:  Intubation.  EXAM: PORTABLE CHEST - 1 VIEW  COMPARISON:  Earlier the same date.  FINDINGS: 1418 hr. The endotracheal tube is well positioned in the midtrachea. A nasogastric tube projects below the diaphragm. Left IJ central venous catheter is unchanged at the level of the mid SVC. There is persistent pulmonary edema with increased opacity at the left lung base, likely due to a combination of pleural fluid and basilar airspace disease. No pneumothorax is evident. The heart size and mediastinal contours are stable.  IMPRESSION: Satisfactory position of the newly placed endotracheal and nasogastric tubes. Increased left basilar airspace disease and probable adjacent pleural fluid.   Electronically Signed   By: Roxy Horseman M.D.   On: 07/31/2013 14:34   Dg Chest Port 1 View  07/31/2013   CLINICAL DATA:  Shortness of breath  EXAM: PORTABLE CHEST - 1 VIEW  COMPARISON:  07/31/2013  FINDINGS: Left-sided jugular central venous catheter in unchanged position. Bilateral interstitial and alveolar airspace opacities with bilateral central pulmonary vascular prominence. No pleural effusions or pneumothorax. Stable cardiomediastinal silhouette. Unremarkable osseous structures.  IMPRESSION: Bilateral interstitial and alveolar airspace opacities as can be seen with an infectious or inflammatory process versus pulmonary edema.   Electronically Signed   By: Elige Ko   On: 07/31/2013 11:38   Dg Chest Port 1 View  07/31/2013   CLINICAL DATA:  Shortness of breath, status post line placement  EXAM: PORTABLE CHEST - 1 VIEW  COMPARISON:  07/30/2013  FINDINGS: There is a left-sided jugular central venous catheter with the tip projecting over the confluence of the left brachiocephalic and SVC.  There are bilateral interstitial and alveolar airspace opacities primarily at the lung bases. There is no pleural effusion or pneumothorax. Stable cardiomediastinal silhouette. The osseous structures are unremarkable.  IMPRESSION: 1. Left-sided jugular central venous catheter with the tip projecting over the confluence of the left brachiocephalic and SVC. 2. Bilateral interstitial and alveolar airspace opacities as can be seen with an infectious or inflammatory etiology versus interstitial edema.   Electronically Signed   By: Elige Ko   On: 07/31/2013 07:59   Scheduled: . antiseptic oral rinse  15 mL Mouth Rinse QID  . aspirin  81 mg Oral Daily  . atorvastatin  20 mg Oral q1800  . chlorhexidine  15 mL Mouth/Throat BID  . clindamycin (CLEOCIN) IV  600 mg Intravenous Q8H  . docusate sodium  100 mg Oral BID  . lidocaine  2 patch Transdermal Q24H  . pantoprazole (PROTONIX) IV  40 mg Intravenous Daily  . pencillin G potassium IV  4 Million Units Intravenous Q8H  . polyethylene glycol  17 g Oral BID  . potassium chloride  10 mEq Intravenous Q1 Hr x 3  . sodium chloride  3 mL Intravenous Q12H      LOS: 4 days   Javen Ridings C 08/01/2013,8:43 AM

## 2013-08-01 NOTE — Progress Notes (Signed)
INITIAL NUTRITION ASSESSMENT  DOCUMENTATION CODES Per approved criteria  -Not Applicable   INTERVENTION: - Initiate Oxepa at 20 ml/hr. Advance by 10 ml/hr every 4 hours to goal rate of 50 ml/hr, with 30 ml Prostat BID which will provide 2000 kcal, 105 g protein, 942 ml free water. - RD to follow nutrition care plan  NUTRITION DIAGNOSIS: Inadequate oral intake related to inability to eat as evidenced by NPO status.  Goal: Patient will meet >/=90% of estimated nutrition needs  Monitor:  TF advancement and tolerance, respiratory status, weight, labs  Reason for Assessment: Consult, Enteral nutrition management  43 y.o. male  Admitting Dx: ARDS (adult respiratory distress syndrome)  ASSESSMENT: Patient admitted with cellulitis, chest pain, and acute kidney injury. He was having 10-15 episodes of loose bowel movements for 3 days prior to admission. During admission, he developed a STEMI on 1/1. Patient intubated and awake on vent. Patient with ARDS and sepsis secondary to cellulitis.   Consult received for TF per nutrition.   Height: Ht Readings from Last 1 Encounters:  07/28/13 5\' 11"  (1.803 m)    Weight: Wt Readings from Last 1 Encounters:  07/31/13 242 lb 14.4 oz (110.179 kg)  Admit weight 210 pounds (+18.8 L fluid balance)  Ideal Body Weight: 172 pounds  % Ideal Body Weight: 122%  Wt Readings from Last 10 Encounters:  07/31/13 242 lb 14.4 oz (110.179 kg)    Usual Body Weight: Unknown  % Usual Body Weight:   BMI:  Body mass index is 29.3 kg/(m^2) based on admission weight. Patient is overweight.   MV: 11.4 Max temp: 36.8  Estimated Nutritional Needs: Kcal: 2082 kcal Protein: 105-125 g Fluid: >2.8 L/[place]  Skin: Intact  Diet Order: NPO  EDUCATION NEEDS: -No education needs identified at this time   Intake/Output Summary (Last 24 hours) at 08/01/13 1411 Last data filed at 08/01/13 1300  Gross per 24 hour  Intake 4299.58 ml  Output   1620 ml  Net  2679.58 ml    Last BM: 1/1   Labs:   Recent Labs Lab 07/30/13 0446 07/31/13 0425 08/01/13 0440  NA 130* 131* 130*  K 3.2* 3.2* 2.9*  CL 89* 87* 88*  CO2 20 21 23   BUN 69* 83* 85*  CREATININE 5.15* 5.89* 5.74*  CALCIUM 6.8* 7.0* 6.5*  MG  --   --  2.1  GLUCOSE 121* 113* 149*    CBG (last 3)   Recent Labs  08/01/13 0913 08/01/13 1222  GLUCAP 156* 114*    Scheduled Meds: . antiseptic oral rinse  15 mL Mouth Rinse QID  . aspirin  81 mg Oral Daily  . atorvastatin  20 mg Oral q1800  . chlorhexidine  15 mL Mouth/Throat BID  . clindamycin (CLEOCIN) IV  600 mg Intravenous Q8H  . docusate sodium  100 mg Oral BID  . furosemide  80 mg Intravenous Q6H  . insulin aspart  0-15 Units Subcutaneous Q4H  . lidocaine  2 patch Transdermal Q24H  . pantoprazole (PROTONIX) IV  40 mg Intravenous Daily  . piperacillin-tazobactam (ZOSYN)  IV  2.25 g Intravenous Q6H  . polyethylene glycol  17 g Oral BID  . sodium chloride  3 mL Intravenous Q12H    Continuous Infusions: . sodium chloride 10 mL/hr at 08/01/13 0900  . sodium chloride 10 mL/hr at 08/01/13 1300  . sodium chloride Stopped (08/01/13 0800)  . fentaNYL infusion INTRAVENOUS 150 mcg/hr (08/01/13 1000)  . heparin 2,000 Units/hr (08/01/13 0900)  .  norepinephrine (LEVOPHED) Adult infusion 12.053 mcg/min (08/01/13 0900)    History reviewed. No pertinent past medical history.  History reviewed. No pertinent past surgical history.  Linnell FullingElyse Courtnie Brenes, RD, LDN Pager #: [phone] After-Hours Pager #: [phone]

## 2013-08-01 NOTE — Progress Notes (Signed)
ANTICOAGULATION CONSULT NOTE  Pharmacy Consult for Heparin Indication: chest pain/ACS  No Known Allergies  Patient Measurements: Height: 5\' 11"  (180.3 cm) Weight:  (unable to weight pt due to bed) IBW/kg (Calculated) : 75.3 Heparin Dosing Weight: 100 kg Vital Signs: Temp: 98.3 F (36.8 C) (01/02 0400) Temp src: Oral (01/02 0400) BP: 132/72 mmHg (01/02 0338) Pulse Rate: 88 (01/02 0600)  Labs:  Recent Labs  07/30/13 0446 07/31/13 0425 07/31/13 0915 07/31/13 1141 07/31/13 1625 07/31/13 1720 08/01/13 0300 08/01/13 0340 08/01/13 0440  HGB 12.8* 12.0*  --   --   --   --   --  12.8*  --   HCT 35.0* 32.9*  --   --   --   --   --  34.8*  --   PLT 136* 108*  --   --   --   --   --  128*  --   LABPROT  --   --  15.1  --   --   --   --   --   --   INR  --   --  1.22  --   --   --   --   --   --   HEPARINUNFRC  --   --   --  <0.10*  --  <0.10* <0.10*  --   --   CREATININE 5.15* 5.89*  --   --   --   --   --   --  5.74*  CKTOTAL 1565* 903*  --   --   --   --   --   --   --   TROPONINI  --  9.33* >20.00*  --  >20.00*  --   --   --   --     Estimated Creatinine Clearance: 20.7 ml/min (by C-G formula based on Cr of 5.74).  Assessment: 43 yo male with chest pain/EKG changes for heparin  Goal of Therapy:  Heparin level 0.3-0.7 units/ml Monitor platelets by anticoagulation protocol: Yes   Plan:  Heparin 3000 units IV bolus, then increase heparin 2000 units/hr Check heparin level in 6 hours.   Eddie Candlebbott, Mccartney Brucks [place] 08/01/2013,6:31 AM

## 2013-08-01 NOTE — Progress Notes (Signed)
eLink Physician-Brief Progress Note Patient Name: Andrew MannersWen Hogan DOB: 12/06/1970 MRN: [id number]  Date of Service  08/01/2013   HPI/Events of Note   K low  eICU Interventions  K supp IV 30meq    Intervention Category Major Interventions: Electrolyte abnormality - evaluation and management  Shan Levansatrick Wright 08/01/2013, 5:36 AM

## 2013-08-01 NOTE — Progress Notes (Signed)
[facility]    Date of Admission:  07/28/2013   Total days of antibiotics 4        Furr 3 clinda        Stevens 3 pen G           ID: Andrew Hogan is a 43 y.o. 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 Underwent CT imaging last night showed edema but no gas formation. Alert despite intubation, denies pain. Blistering and weeping is occuring from affected right chest wall/flank.  Leukocytosis worsened from 25 to 33.6  Medications:  . antiseptic oral rinse  15 mL Mouth Rinse QID  . aspirin  81 mg Oral Daily  . atorvastatin  20 mg Oral q1800  . chlorhexidine  15 mL Mouth/Throat BID  . clindamycin (CLEOCIN) IV  600 mg Intravenous Q8H  . docusate sodium  100 mg Oral BID  . furosemide  80 mg Intravenous Q6H  . insulin aspart  0-15 Units Subcutaneous Q4H  . lidocaine  2 patch Transdermal Q24H  . pantoprazole (PROTONIX) IV  40 mg Intravenous Daily  . piperacillin-tazobactam (ZOSYN)  IV  2.25 g Intravenous Q6H  . polyethylene glycol  17 g Oral BID  . potassium chloride  10 mEq Intravenous Q1 Hr x 3  . sodium chloride  3 mL Intravenous Q12H    Objective: Vital signs in last 24 hours: Temp:  [97 F (36.1 C)-98.3 F (36.8 C)] 97.8 F (36.6 C) (01/02 0800) Pulse Rate:  [86-124] 102 (01/02 1100) Resp:  [14-34] 28 (01/02 1100) BP: (105-165)/(62-84) 119/66 mmHg (01/02 0829) SpO2:  [90 %-100 %] 98 % (01/02 1100) Arterial Line BP: (85-152)/(58-83) 111/64 mmHg (01/02  1100) FiO2 (%):  [50 %-100 %] 50 % (01/02 0829) Physical Exam  Constitutional: intubated but alert HENT:  Mouth/Throat: Oropharynx is clear and moist. No oropharyngeal exudate.  Cardiovascular: Normal rate, regular rhythm and normal heart sounds. Exam reveals no gallop and no friction rub.  No murmur heard.  Pulmonary/Chest: Effort normal and breath sounds normal. No respiratory distress. He has no wheezes.  Abdominal: Soft. Bowel sounds are normal. He exhibits no distension. There is no tenderness.  Lymphadenopathy: no cervical adenopathy.  Skin: erythema to chest wall     Lab Results  Recent Labs  07/31/13 0425 08/01/13 0340 08/01/13 0440  WBC 25.6* 33.6*  --   HGB 12.0* 12.8*  --   HCT 32.9* 34.8*  --   NA 131*  --  130*  K 3.2*  --  2.9*  CL 87*  --  88*  CO2 21  --  23  BUN 83*  --  85*  CREATININE 5.89*  --  5.74*   Liver Panel  Recent Labs  07/30/13 0446 07/31/13 0425  PROT 5.0* 5.1*  ALBUMIN 1.6* 1.4*  AST 50* 69*  ALT 39 39  ALKPHOS 48 87  BILITOT 1.7* 2.2*    Microbiology: 12/29  Blood cx x 2: Group  A strep Studies/Results: Ct Abdomen Pelvis Wo Contrast  07/31/2013   CLINICAL DATA:  Evaluate for gas in the right flank.  EXAM: CT CHEST, ABDOMEN AND PELVIS WITHOUT CONTRAST  TECHNIQUE: Multidetector CT imaging of the chest, abdomen and pelvis was performed following the standard protocol without IV contrast.  COMPARISON:  No priors.  FINDINGS: CT CHEST FINDINGS  Mediastinum: Left internal jugular central venous catheter with tip terminating in the mid superior vena cava. Endotracheal tube with tip in the upper trachea shortly above the level of the aortic arch. Nasogastric tube in the body of the stomach. Heart size is borderline enlarged. Trace amount of pericardial fluid or thickening, unlikely to be of hemodynamic significance at this time. No associated pericardial calcification. No pathologically enlarged mediastinal or hilar lymph nodes. Please note that  accurate exclusion of hilar adenopathy is limited on noncontrast CT scans. Esophagus is unremarkable in appearance.  Lungs/Pleura: Small bilateral pleural effusions (right greater than left) layering dependently with some associated passive atelectasis throughout the lower lobes of the lungs bilaterally. Some superimposed airspace consolidation in the lower lobes of the lungs bilaterally is also suspected, potentially related to mild aspiration. Several foci of peribronchovascular ground-glass attenuation are also noted in the right middle lobe, which could represent some endobronchial spread of infection/inflammation. No other suspicious appearing pulmonary nodules or masses are noted. No pneumothorax.  Musculoskeletal: There are no aggressive appearing lytic or blastic lesions noted in the visualized portions of the skeleton.  CT ABDOMEN AND PELVIS FINDINGS  Abdomen/Pelvis: Multiple nonobstructive calculi are noted within the collecting systems of the kidneys bilaterally, largest of which is in the lower pole collecting system of the left kidney measuring 1 cm. No ureteral stones or findings of urinary tract obstruction are noted at this time. The unenhanced appearance of the kidneys is otherwise unremarkable bilaterally. Several sub cm low-attenuation hepatic lesions are noted, but are incompletely characterized on today's non contrast CT examination. Diffuse decreased attenuation throughout the hepatic parenchyma, indicative of hepatic steatosis. The unenhanced appearance of the gallbladder, pancreas, spleen and bilateral adrenal glands is unremarkable.  Trace volume of free fluid in the pelvis. No larger volume of ascites. No pneumoperitoneum. No pathologic distention of small bowel. No definite lymphadenopathy identified within the abdomen or pelvis on today's non contrast CT examination. Prostate gland and urinary bladder are unremarkable in appearance. A Foley balloon catheter is present with tip in the  lumen of the urinary bladder.  Musculoskeletal: Diffuse body wall edema. No definite gas identified within the soft tissues of the right flank. There are no aggressive appearing lytic or blastic lesions noted in the visualized portions of the skeleton.  IMPRESSION: 1. No subcutaneous or intramuscular gas identified within the soft tissues of the right flank. 2. Diffuse body wall edema with small bilateral pleural effusions, trace ascites and trace amount of pericardial fluid, likely to reflect a state of anasarca. 3. In addition to the areas of passive atelectasis in the lower lobes of the lungs bilaterally there is likely some superimposed airspace consolidation, as well as small foci of airspace disease in the right middle lobe, concerning for sequela of aspiration. 4. Hepatic steatosis. 5. Nonobstructive calculi within the collecting systems of the kidneys bilaterally, largest of which measures 1 cm in the lower pole of the left kidney. No ureteral stones or findings of urinary tract obstruction at this time. 6. Support apparatus, as above.   Electronically Signed   By: Trudie Reedaniel  Entrikin M.D.   On: 07/31/2013 23:08  Dg Chest 2 View  07/30/2013   CLINICAL DATA:  Pain.  EXAM: CHEST  2 VIEW  COMPARISON:  Chest x-ray 07/30/2013.  Chest CT 07/29/2013.  FINDINGS: Prominent bibasilar atelectasis and/or pneumonia noted. No pneumothorax. Stable cardiomegaly, no pulmonary venous distention or prominent pleural effusion. Mild pleural thickening present. No acute osseous abnormality. Degenerative changes thoracic spine.  IMPRESSION: Dense bibasilar atelectasis and/or infiltrates.   Electronically Signed   By: Maisie Fus  Register   On: 07/30/2013 14:09   Dg Abd 1 View  07/30/2013   CLINICAL DATA:  Pain.  EXAM: ABDOMEN - 1 VIEW  COMPARISON:  CT 07/29/2013.  FINDINGS: As noted on CT diffuse right side secondary soft tissue swelling is present. Air-filled nondilated loops of small and large bowel noted. This is a  nonspecific finding. Bilateral nephrolithiasis. No acute bony abnormality. Sclerotic density noted in the right ilium most consistent with bone island. No other sclerotic bony densities noted to suggest metastatic disease.  IMPRESSION: 1. Soft tissue swelling of the right abdomen subcutaneous tissue, reference made to recent CT report. 2. Bilateral nephrolithiasis. 3. Nonspecific nondistended air-filled loops of small large bowel noted. 4. Sclerotic density node in the right iliac wing consistent with bone island .   Electronically Signed   By: Maisie Fus  Register   On: 07/30/2013 13:33   Ct Chest Wo Contrast  07/31/2013   CLINICAL DATA:  Evaluate for gas in the right flank.  EXAM: CT CHEST, ABDOMEN AND PELVIS WITHOUT CONTRAST  TECHNIQUE: Multidetector CT imaging of the chest, abdomen and pelvis was performed following the standard protocol without IV contrast.  COMPARISON:  No priors.  FINDINGS: CT CHEST FINDINGS  Mediastinum: Left internal jugular central venous catheter with tip terminating in the mid superior vena cava. Endotracheal tube with tip in the upper trachea shortly above the level of the aortic arch. Nasogastric tube in the body of the stomach. Heart size is borderline enlarged. Trace amount of pericardial fluid or thickening, unlikely to be of hemodynamic significance at this time. No associated pericardial calcification. No pathologically enlarged mediastinal or hilar lymph nodes. Please note that accurate exclusion of hilar adenopathy is limited on noncontrast CT scans. Esophagus is unremarkable in appearance.  Lungs/Pleura: Small bilateral pleural effusions (right greater than left) layering dependently with some associated passive atelectasis throughout the lower lobes of the lungs bilaterally. Some superimposed airspace consolidation in the lower lobes of the lungs bilaterally is also suspected, potentially related to mild aspiration. Several foci of peribronchovascular ground-glass attenuation  are also noted in the right middle lobe, which could represent some endobronchial spread of infection/inflammation. No other suspicious appearing pulmonary nodules or masses are noted. No pneumothorax.  Musculoskeletal: There are no aggressive appearing lytic or blastic lesions noted in the visualized portions of the skeleton.  CT ABDOMEN AND PELVIS FINDINGS  Abdomen/Pelvis: Multiple nonobstructive calculi are noted within the collecting systems of the kidneys bilaterally, largest of which is in the lower pole collecting system of the left kidney measuring 1 cm. No ureteral stones or findings of urinary tract obstruction are noted at this time. The unenhanced appearance of the kidneys is otherwise unremarkable bilaterally. Several sub cm low-attenuation hepatic lesions are noted, but are incompletely characterized on today's non contrast CT examination. Diffuse decreased attenuation throughout the hepatic parenchyma, indicative of hepatic steatosis. The unenhanced appearance of the gallbladder, pancreas, spleen and bilateral adrenal glands is unremarkable.  Trace volume of free fluid in the pelvis. No larger volume of ascites. No pneumoperitoneum. No pathologic distention  of small bowel. No definite lymphadenopathy identified within the abdomen or pelvis on today's non contrast CT examination. Prostate gland and urinary bladder are unremarkable in appearance. A Foley balloon catheter is present with tip in the lumen of the urinary bladder.  Musculoskeletal: Diffuse body wall edema. No definite gas identified within the soft tissues of the right flank. There are no aggressive appearing lytic or blastic lesions noted in the visualized portions of the skeleton.  IMPRESSION: 1. No subcutaneous or intramuscular gas identified within the soft tissues of the right flank. 2. Diffuse body wall edema with small bilateral pleural effusions, trace ascites and trace amount of pericardial fluid, likely to reflect a state of  anasarca. 3. In addition to the areas of passive atelectasis in the lower lobes of the lungs bilaterally there is likely some superimposed airspace consolidation, as well as small foci of airspace disease in the right middle lobe, concerning for sequela of aspiration. 4. Hepatic steatosis. 5. Nonobstructive calculi within the collecting systems of the kidneys bilaterally, largest of which measures 1 cm in the lower pole of the left kidney. No ureteral stones or findings of urinary tract obstruction at this time. 6. Support apparatus, as above.   Electronically Signed   By: Trudie Reed M.D.   On: 07/31/2013 23:08   US Renal  07/30/2013   CLINICAL DATA:  Acute renal insufficiency.  Elevated creatinine.  EXAM: RENAL/URINARY TRACT ULTRASOUND COMPLETE  COMPARISON:  CT 07/29/2013.  FINDINGS: Right Kidney:  Length: 12.3 cm. Echogenicity within normal limits. No mass or hydronephrosis visualized.  Left Kidney:  Length: 13.1 cm. Echogenicity within normal limits. 6 mm stone over the lower pole. No mass or hydronephrosis visualized.  Bladder:  Decompressed as Foley catheter is present.  IMPRESSION: Normal size kidneys without evidence of hydronephrosis.  6 mm stone over the left lower pole.   Electronically Signed   By: Elberta Fortis M.D.   On: 07/30/2013 21:54   Portable Chest Xray  07/31/2013   CLINICAL DATA:  Intubation.  EXAM: PORTABLE CHEST - 1 VIEW  COMPARISON:  Earlier the same date.  FINDINGS: 1418 hr. The endotracheal tube is well positioned in the midtrachea. A nasogastric tube projects below the diaphragm. Left IJ central venous catheter is unchanged at the level of the mid SVC. There is persistent pulmonary edema with increased opacity at the left lung base, likely due to a combination of pleural fluid and basilar airspace disease. No pneumothorax is evident. The heart size and mediastinal contours are stable.  IMPRESSION: Satisfactory position of the newly placed endotracheal and nasogastric tubes.  Increased left basilar airspace disease and probable adjacent pleural fluid.   Electronically Signed   By: Roxy Horseman M.D.   On: 07/31/2013 14:34   Dg Chest Port 1 View  07/31/2013   CLINICAL DATA:  Shortness of breath  EXAM: PORTABLE CHEST - 1 VIEW  COMPARISON:  07/31/2013  FINDINGS: Left-sided jugular central venous catheter in unchanged position. Bilateral interstitial and alveolar airspace opacities with bilateral central pulmonary vascular prominence. No pleural effusions or pneumothorax. Stable cardiomediastinal silhouette. Unremarkable osseous structures.  IMPRESSION: Bilateral interstitial and alveolar airspace opacities as can be seen with an infectious or inflammatory process versus pulmonary edema.   Electronically Signed   By: Elige Ko   On: 07/31/2013 11:38   Dg Chest Port 1 View  07/31/2013   CLINICAL DATA:  Shortness of breath, status post line placement  EXAM: PORTABLE CHEST - 1 VIEW  COMPARISON:  07/30/2013  FINDINGS: There is a left-sided jugular central venous catheter with the tip projecting over the confluence of the left brachiocephalic and SVC. There are bilateral interstitial and alveolar airspace opacities primarily at the lung bases. There is no pleural effusion or pneumothorax. Stable cardiomediastinal silhouette. The osseous structures are unremarkable.  IMPRESSION: 1. Left-sided jugular central venous catheter with the tip projecting over the confluence of the left brachiocephalic and SVC. 2. Bilateral interstitial and alveolar airspace opacities as can be seen with an infectious or inflammatory etiology versus interstitial edema.   Electronically Signed   By: Elige Ko   On: 07/31/2013 07:59     Assessment/Plan: Group A strep myositis/celllulitis and bacteremia = physical exam showing blistering now occuring on mid chest wall of anterior axillary line, right flank and right anterior hip area. can continue with piptazo (concern for aspiration pna per pccm) and  clindamycin. Still concern for worsening infection that may need to have surgical debridement. Keep clinda for toxin inhibition  Leukocytosis = Will add differential to cbc today. Continue with daily cbc. Leukocytosis can be in part leukamoid reaction but given that he has group A strep, concerned for progression of disease causing nec fasc. Recent imaging appears to have no gas formation. Will discuss with surgery.  If he develops diarrhea, would have low threshold to check for cdifficile   Drue Second [facility] Cell: [phone] Pager: [phone]  08/01/2013, 12:01 PM

## 2013-08-01 NOTE — Progress Notes (Signed)
Subjective: Patient required intubation yesterday. Repeat CT scan showed no gas in soft tissues Patient is awake and alert on ventilator - indicates that his side is feeling better and not having any pain  Objective: Vital signs in last 24 hours: Temp:  [97 F (36.1 C)-98.3 F (36.8 C)] 98.3 F (36.8 C) (01/02 0400) Pulse Rate:  [71-124] 89 (01/02 0700) Resp:  [14-34] 28 (01/02 0700) BP: (85-165)/(41-84) 132/72 mmHg (01/02 0338) SpO2:  [90 %-100 %] 100 % (01/02 0700) Arterial Line BP: (85-152)/(52-83) 125/69 mmHg (01/02 0700) FiO2 (%):  [50 %-100 %] 50 % (01/02 0400) Last BM Date: 07/30/13  Intake/Output from previous Angel[facility]: 01/01 0701 - 01/02 0700 In: 8428.5 [I.V.:3428.5; NG/GT:150; IV Piggyback:4850] Out: 1355 [Urine:1155; Emesis/NG output:200] Intake/Output this shift:    General appearance: alert, cooperative, no distress and breathing comfortably on ventilator Right side - moderate erythema with some induration - no fluctuance or drainage noted  Lab Results:   Recent Labs  07/31/13 0425 08/01/13 0340  WBC 25.6* 33.6*  HGB 12.0* 12.8*  HCT 32.9* 34.8*  PLT 108* 128*   BMET  Recent Labs  07/31/13 0425 08/01/13 0440  NA 131* 130*  K 3.2* 2.9*  CL 87* 88*  CO2 21 23  GLUCOSE 113* 149*  BUN 83* 85*  CREATININE 5.89* 5.74*  CALCIUM 7.0* 6.5*   PT/INR  Recent Labs  07/31/13 0915  LABPROT 15.1  INR 1.22   ABG  Recent Labs  07/31/13 1617 07/31/13 1851  PHART 7.245* 7.355  HCO3 24.9* 23.6    Studies/Results: Ct Abdomen Pelvis Wo Contrast  07/31/2013   CLINICAL DATA:  Evaluate for gas in the right flank.  EXAM: CT CHEST, ABDOMEN AND PELVIS WITHOUT CONTRAST  TECHNIQUE: Multidetector CT imaging of the chest, abdomen and pelvis was performed following the standard protocol without IV contrast.  COMPARISON:  No priors.  FINDINGS: CT CHEST FINDINGS  Mediastinum: Left internal jugular central venous catheter with tip terminating in the mid superior vena  cava. Endotracheal tube with tip in the upper trachea shortly above the level of the aortic arch. Nasogastric tube in the body of the stomach. Heart size is borderline enlarged. Trace amount of pericardial fluid or thickening, unlikely to be of hemodynamic significance at this time. No associated pericardial calcification. No pathologically enlarged mediastinal or hilar lymph nodes. Please note that accurate exclusion of hilar adenopathy is limited on noncontrast CT scans. Esophagus is unremarkable in appearance.  Lungs/Pleura: Small bilateral pleural effusions (right greater than left) layering dependently with some associated passive atelectasis throughout the lower lobes of the lungs bilaterally. Some superimposed airspace consolidation in the lower lobes of the lungs bilaterally is also suspected, potentially related to mild aspiration. Several foci of peribronchovascular ground-glass attenuation are also noted in the right middle lobe, which could represent some endobronchial spread of infection/inflammation. No other suspicious appearing pulmonary nodules or masses are noted. No pneumothorax.  Musculoskeletal: There are no aggressive appearing lytic or blastic lesions noted in the visualized portions of the skeleton.  CT ABDOMEN AND PELVIS FINDINGS  Abdomen/Pelvis: Multiple nonobstructive calculi are noted within the collecting systems of the kidneys bilaterally, largest of which is in the lower pole collecting system of the left kidney measuring 1 cm. No ureteral stones or findings of urinary tract obstruction are noted at this time. The unenhanced appearance of the kidneys is otherwise unremarkable bilaterally. Several sub cm low-attenuation hepatic lesions are noted, but are incompletely characterized on today's non contrast CT examination. Diffuse decreased  attenuation throughout the hepatic parenchyma, indicative of hepatic steatosis. The unenhanced appearance of the gallbladder, pancreas, spleen and  bilateral adrenal glands is unremarkable.  Trace volume of free fluid in the pelvis. No larger volume of ascites. No pneumoperitoneum. No pathologic distention of small bowel. No definite lymphadenopathy identified within the abdomen or pelvis on today's non contrast CT examination. Prostate gland and urinary bladder are unremarkable in appearance. A Foley balloon catheter is present with tip in the lumen of the urinary bladder.  Musculoskeletal: Diffuse body wall edema. No definite gas identified within the soft tissues of the right flank. There are no aggressive appearing lytic or blastic lesions noted in the visualized portions of the skeleton.  IMPRESSION: 1. No subcutaneous or intramuscular gas identified within the soft tissues of the right flank. 2. Diffuse body wall edema with small bilateral pleural effusions, trace ascites and trace amount of pericardial fluid, likely to reflect a state of anasarca. 3. In addition to the areas of passive atelectasis in the lower lobes of the lungs bilaterally there is likely some superimposed airspace consolidation, as well as small foci of airspace disease in the right middle lobe, concerning for sequela of aspiration. 4. Hepatic steatosis. 5. Nonobstructive calculi within the collecting systems of the kidneys bilaterally, largest of which measures 1 cm in the lower pole of the left kidney. No ureteral stones or findings of urinary tract obstruction at this time. 6. Support apparatus, as above.   Electronically Signed   By: Trudie Reed M.D.   On: 07/31/2013 23:08   Dg Chest 2 View  07/30/2013   CLINICAL DATA:  Pain.  EXAM: CHEST  2 VIEW  COMPARISON:  Chest x-ray 07/30/2013.  Chest CT 07/29/2013.  FINDINGS: Prominent bibasilar atelectasis and/or pneumonia noted. No pneumothorax. Stable cardiomegaly, no pulmonary venous distention or prominent pleural effusion. Mild pleural thickening present. No acute osseous abnormality. Degenerative changes thoracic spine.   IMPRESSION: Dense bibasilar atelectasis and/or infiltrates.   Electronically Signed   By: Maisie Fus  Register   On: 07/30/2013 14:09   Dg Abd 1 View  07/30/2013   CLINICAL DATA:  Pain.  EXAM: ABDOMEN - 1 VIEW  COMPARISON:  CT 07/29/2013.  FINDINGS: As noted on CT diffuse right side secondary soft tissue swelling is present. Air-filled nondilated loops of small and large bowel noted. This is a nonspecific finding. Bilateral nephrolithiasis. No acute bony abnormality. Sclerotic density noted in the right ilium most consistent with bone island. No other sclerotic bony densities noted to suggest metastatic disease.  IMPRESSION: 1. Soft tissue swelling of the right abdomen subcutaneous tissue, reference made to recent CT report. 2. Bilateral nephrolithiasis. 3. Nonspecific nondistended air-filled loops of small large bowel noted. 4. Sclerotic density node in the right iliac wing consistent with bone island .   Electronically Signed   By: Maisie Fus  Register   On: 07/30/2013 13:33   Ct Chest Wo Contrast  07/31/2013   CLINICAL DATA:  Evaluate for gas in the right flank.  EXAM: CT CHEST, ABDOMEN AND PELVIS WITHOUT CONTRAST  TECHNIQUE: Multidetector CT imaging of the chest, abdomen and pelvis was performed following the standard protocol without IV contrast.  COMPARISON:  No priors.  FINDINGS: CT CHEST FINDINGS  Mediastinum: Left internal jugular central venous catheter with tip terminating in the mid superior vena cava. Endotracheal tube with tip in the upper trachea shortly above the level of the aortic arch. Nasogastric tube in the body of the stomach. Heart size is borderline enlarged. Trace amount of  pericardial fluid or thickening, unlikely to be of hemodynamic significance at this time. No associated pericardial calcification. No pathologically enlarged mediastinal or hilar lymph nodes. Please note that accurate exclusion of hilar adenopathy is limited on noncontrast CT scans. Esophagus is unremarkable in appearance.   Lungs/Pleura: Small bilateral pleural effusions (right greater than left) layering dependently with some associated passive atelectasis throughout the lower lobes of the lungs bilaterally. Some superimposed airspace consolidation in the lower lobes of the lungs bilaterally is also suspected, potentially related to mild aspiration. Several foci of peribronchovascular ground-glass attenuation are also noted in the right middle lobe, which could represent some endobronchial spread of infection/inflammation. No other suspicious appearing pulmonary nodules or masses are noted. No pneumothorax.  Musculoskeletal: There are no aggressive appearing lytic or blastic lesions noted in the visualized portions of the skeleton.  CT ABDOMEN AND PELVIS FINDINGS  Abdomen/Pelvis: Multiple nonobstructive calculi are noted within the collecting systems of the kidneys bilaterally, largest of which is in the lower pole collecting system of the left kidney measuring 1 cm. No ureteral stones or findings of urinary tract obstruction are noted at this time. The unenhanced appearance of the kidneys is otherwise unremarkable bilaterally. Several sub cm low-attenuation hepatic lesions are noted, but are incompletely characterized on today's non contrast CT examination. Diffuse decreased attenuation throughout the hepatic parenchyma, indicative of hepatic steatosis. The unenhanced appearance of the gallbladder, pancreas, spleen and bilateral adrenal glands is unremarkable.  Trace volume of free fluid in the pelvis. No larger volume of ascites. No pneumoperitoneum. No pathologic distention of small bowel. No definite lymphadenopathy identified within the abdomen or pelvis on today's non contrast CT examination. Prostate gland and urinary bladder are unremarkable in appearance. A Foley balloon catheter is present with tip in the lumen of the urinary bladder.  Musculoskeletal: Diffuse body wall edema. No definite gas identified within the soft  tissues of the right flank. There are no aggressive appearing lytic or blastic lesions noted in the visualized portions of the skeleton.  IMPRESSION: 1. No subcutaneous or intramuscular gas identified within the soft tissues of the right flank. 2. Diffuse body wall edema with small bilateral pleural effusions, trace ascites and trace amount of pericardial fluid, likely to reflect a state of anasarca. 3. In addition to the areas of passive atelectasis in the lower lobes of the lungs bilaterally there is likely some superimposed airspace consolidation, as well as small foci of airspace disease in the right middle lobe, concerning for sequela of aspiration. 4. Hepatic steatosis. 5. Nonobstructive calculi within the collecting systems of the kidneys bilaterally, largest of which measures 1 cm in the lower pole of the left kidney. No ureteral stones or findings of urinary tract obstruction at this time. 6. Support apparatus, as above.   Electronically Signed   By: Trudie Reed M.D.   On: 07/31/2013 23:08   US Renal  07/30/2013   CLINICAL DATA:  Acute renal insufficiency.  Elevated creatinine.  EXAM: RENAL/URINARY TRACT ULTRASOUND COMPLETE  COMPARISON:  CT 07/29/2013.  FINDINGS: Right Kidney:  Length: 12.3 cm. Echogenicity within normal limits. No mass or hydronephrosis visualized.  Left Kidney:  Length: 13.1 cm. Echogenicity within normal limits. 6 mm stone over the lower pole. No mass or hydronephrosis visualized.  Bladder:  Decompressed as Foley catheter is present.  IMPRESSION: Normal size kidneys without evidence of hydronephrosis.  6 mm stone over the left lower pole.   Electronically Signed   By: Elberta Fortis M.D.   On: 07/30/2013  21:54   Portable Chest Xray  07/31/2013   CLINICAL DATA:  Intubation.  EXAM: PORTABLE CHEST - 1 VIEW  COMPARISON:  Earlier the same date.  FINDINGS: 1418 hr. The endotracheal tube is well positioned in the midtrachea. A nasogastric tube projects below the diaphragm. Left IJ  central venous catheter is unchanged at the level of the mid SVC. There is persistent pulmonary edema with increased opacity at the left lung base, likely due to a combination of pleural fluid and basilar airspace disease. No pneumothorax is evident. The heart size and mediastinal contours are stable.  IMPRESSION: Satisfactory position of the newly placed endotracheal and nasogastric tubes. Increased left basilar airspace disease and probable adjacent pleural fluid.   Electronically Signed   By: Roxy Horseman M.D.   On: 07/31/2013 14:34   Dg Chest Port 1 View  07/31/2013   CLINICAL DATA:  Shortness of breath  EXAM: PORTABLE CHEST - 1 VIEW  COMPARISON:  07/31/2013  FINDINGS: Left-sided jugular central venous catheter in unchanged position. Bilateral interstitial and alveolar airspace opacities with bilateral central pulmonary vascular prominence. No pleural effusions or pneumothorax. Stable cardiomediastinal silhouette. Unremarkable osseous structures.  IMPRESSION: Bilateral interstitial and alveolar airspace opacities as can be seen with an infectious or inflammatory process versus pulmonary edema.   Electronically Signed   By: Elige Ko   On: 07/31/2013 11:38   Dg Chest Port 1 View  07/31/2013   CLINICAL DATA:  Shortness of breath, status post line placement  EXAM: PORTABLE CHEST - 1 VIEW  COMPARISON:  07/30/2013  FINDINGS: There is a left-sided jugular central venous catheter with the tip projecting over the confluence of the left brachiocephalic and SVC. There are bilateral interstitial and alveolar airspace opacities primarily at the lung bases. There is no pleural effusion or pneumothorax. Stable cardiomediastinal silhouette. The osseous structures are unremarkable.  IMPRESSION: 1. Left-sided jugular central venous catheter with the tip projecting over the confluence of the left brachiocephalic and SVC. 2. Bilateral interstitial and alveolar airspace opacities as can be seen with an infectious or  inflammatory etiology versus interstitial edema.   Electronically Signed   By: Elige Ko   On: 07/31/2013 07:59    Anti-infectives: Anti-infectives   Start     Dose/Rate Route Frequency Ordered Stop   07/31/13 0600  vancomycin (VANCOCIN) IVPB 1000 mg/200 mL premix  Status:  Discontinued     1,000 mg 200 mL/hr over 60 Minutes Intravenous Every 48 hours 07/29/13 0305 07/30/13 1109   07/30/13 2200  penicillin G potassium 4 Million Units in dextrose 5 % 250 mL IVPB     4 Million Units 250 mL/hr over 60 Minutes Intravenous 3 times per Hildebrandt 07/30/13 1258     07/30/13 1400  clindamycin (CLEOCIN) IVPB 600 mg    Comments:  Pharmacy may adjust antiboitics as needed; GAS, ? necrotizing   600 mg 100 mL/hr over 30 Minutes Intravenous 3 times per Shurley 07/30/13 1109     07/30/13 1200  penicillin G potassium 4 Million Units in dextrose 5 % 250 mL IVPB    Comments:  Group A strep   4 Million Units 250 mL/hr over 60 Minutes Intravenous 6 times per Gavilanes 07/30/13 1109 07/30/13 1516   07/29/13 0400  vancomycin (VANCOCIN) IVPB 1000 mg/200 mL premix     1,000 mg 200 mL/hr over 60 Minutes Intravenous  Once 07/29/13 0305 07/29/13 0752   07/29/13 0400  piperacillin-tazobactam (ZOSYN) IVPB 3.375 g  Status:  Discontinued  3.375 g 12.5 mL/hr over 240 Minutes Intravenous 3 times per Voeltz 07/29/13 0305 07/30/13 1109   07/28/13 1945  vancomycin (VANCOCIN) IVPB 1000 mg/200 mL premix     1,000 mg 200 mL/hr over 60 Minutes Intravenous  Once 07/28/13 1934 07/28/13 2105   07/28/13 1945  piperacillin-tazobactam (ZOSYN) IVPB 3.375 g     3.375 g 12.5 mL/hr over 240 Minutes Intravenous  Once 07/28/13 1934 07/28/13 2033      Assessment/Plan: s/p * No surgery found * Group A strep cellulitis and myositis/ STEMI No sign of abscess or necrotizing fasciitis Continue current therapy   LOS: 4 days    Samanatha Brammer K. 08/01/2013

## 2013-08-01 NOTE — Progress Notes (Addendum)
PULMONARY / CRITICAL CARE MEDICINE  Name: Andrew MannersWen Dai MRN: [id number] DOB: 08/26/1970    ADMISSION DATE:  07/28/2013 CONSULTATION DATE:  07/31/13  REFERRING MD :  [facility] PRIMARY SERVICE: PCCM  CHIEF COMPLAINT:  Abdominal pain  BRIEF PATIENT DESCRIPTION: 43 y/o male with no past medical history was admitted on 12/29 with myositis and cellulitis of his abdominal wall on 12/29.  He developed hypotension, renal failure, and an STEMI on 1/1 in the early AM so PCCM was consulted.  SIGNIFICANT EVENTS / STUDIES:  12/30  CT chest/abdomen/pelvis >>> Extensive soft tissue and intramuscular cellulitis/infiltration  and edema from left shoulder to pelvis, no discrete abscess identified 12/30   TTE >>> EF 60-65%, grade 1 diastolic dysfunction 12/31  Renal U/S > Normal size kidneys, no hydro, 6mm stone left lower pole 01/01  TTE >>> EF 55-60%, no RWMA PAP 42 torr 01/01  CT chest/abdomen/pelvis >>> NO soft tissue gas, anasarca, pleural effusions, trace pericardial  effusion, bilateral airspace disease ( new RML), hepatic steatosis, bilateral nonobstructive  calculi  LINES / TUBES: OETT 1/1 >>> OGT 1/1 >>> L IJ CVL 1/1 >> R rad a-line 1/1 >>> Foley 1/1 >>>  CULTURES: 12/29 Blood >> Group a strep 12/30 Urine >> neg  ANTIBIOTICS: Vancomycin 12/29 x1; 1/2 >>> Zosyn 12/29 >>> 12/30; 1/2 >>> Clindamycin 12/31 >>> Penicillin G 12/31 >>> 1/2  INTERVAL HISTORY:  No acute overnight events.  VITAL SIGNS: Temp:  [97 F (36.1 C)-98.3 F (36.8 C)] 98.3 F (36.8 C) (01/02 0400) Pulse Rate:  [71-124] 88 (01/02 0600) Resp:  [14-34] 28 (01/02 0600) BP: (85-165)/(41-84) 132/72 mmHg (01/02 0338) SpO2:  [90 %-100 %] 98 % (01/02 0600) Arterial Line BP: (85-152)/(52-83) 136/76 mmHg (01/02 0600) FiO2 (%):  [50 %-100 %] 50 % (01/02 0400) HEMODYNAMICS: CVP:  [10 mmHg-18 mmHg] 12 mmHg VENTILATOR SETTINGS: Vent Mode:  [-] PRVC FiO2 (%):  [50 %-100 %] 50 % Set Rate:  [14 bmp-28 bmp] 28 bmp Vt Set:  [450 mL-600 mL]  450 mL PEEP:  [5 cmH20-10 cmH20] 10 cmH20 Plateau Pressure:  [18 cmH20-28 cmH20] 18 cmH20 INTAKE / OUTPUT: Intake/Output     01/01 0701 - 01/02 0700 01/02 0701 - 01/03 0700   P.O.     I.V. (mL/kg) 3284 (29.8)    NG/GT 150    IV Piggyback 4850    Total Intake(mL/kg) 8284 (75.2)    Urine (mL/kg/hr) 1155 (0.4)    Emesis/NG output 200 (0.1)    Total Output 1355     Net +6929            PHYSICAL EXAMINATION:  Gen: Comfortable, no distress HEENT: PERRL PULM: Bilateral air entry, few rales CV: Regular, no murmurs AB: Soft, bowel sounds present. Abdominal wall edema Ext: Trace edema bilaterally Derm: Erythematous, tender rash throughout left pectoralis (lateral aspect) extending down to mid thigh on left, blisters  Neuro: Awake, alert, following commands  LABS:  CBC  Recent Labs Lab 07/30/13 0446 07/31/13 0425 08/01/13 0340  WBC 15.5* 25.6* 33.6*  HGB 12.8* 12.0* 12.8*  HCT 35.0* 32.9* 34.8*  PLT 136* 108* 128*   Coag's  Recent Labs Lab 07/28/13 1911 07/29/13 0300 07/31/13 0915  APTT  --  46*  --   INR 1.10 1.25 1.22   BMET  Recent Labs Lab 07/30/13 0446 07/31/13 0425 08/01/13 0440  NA 130* 131* 130*  K 3.2* 3.2* 2.9*  CL 89* 87* 88*  CO2 20 21 23   BUN 69* 83* 85*  CREATININE  5.15* 5.89* 5.74*  GLUCOSE 121* 113* 149*   Electrolytes  Recent Labs Lab 07/30/13 0446 07/31/13 0425 08/01/13 0440  CALCIUM 6.8* 7.0* 6.5*  MG  --   --  2.1   Sepsis Markers  Recent Labs Lab 07/29/13 0520 07/29/13 1215 07/31/13 0425 07/31/13 1100 07/31/13 1700  LATICACIDVEN 3.5*  --   --  1.6 1.5  PROCALCITON  --  54.96 48.97  --   --    ABG  Recent Labs Lab 07/31/13 0915 07/31/13 1617 07/31/13 1851  PHART 7.407 7.245* 7.355  PCO2ART 32.7* 57.3* 43.2  PO2ART 69.4* 131.0* 107.0*   Liver Enzymes  Recent Labs Lab 07/29/13 0300 07/30/13 0446 07/31/13 0425  AST 39* 50* 69*  ALT 31 39 39  ALKPHOS 40 48 87  BILITOT 1.5* 1.7* 2.2*  ALBUMIN 2.0* 1.6*  1.4*   Cardiac Enzymes  Recent Labs Lab 07/28/13 1911 07/31/13 0425 07/31/13 0915 07/31/13 1625  TROPONINI <0.30 9.33* >20.00* >20.00*  PROBNP 500.6*  --   --   --    Glucose  Recent Labs Lab 07/28/13 2005  GLUCAP 149*   CXR: None today  ASSESSMENT / PLAN:  PULMONARY A:  Acute respiratory failure in setting of sepsis / AMI. Acute pulmonary edema. Aspiration pneumonia. P:   Goal pH>7.30, SpO2>92 Continuous mechanical support VAP bundle Daily SBT Trend ABG/CXR  CARDIOVASCULAR A:  Septic / cardiogenic shock. ( SvO2 77 and normal LV function favoring septic ). Lactate 1.5 reassuring. STEMI. P:  Cardiology following Goal MAP > 65 ASA, Heparin, Lipitor BB/ACEI contraindicated shock    RENAL A:   AKI. Doubt significant rhabdomyolysis as normal transaminases / CK decreasing. Total body fluid overload ( stay balance pos 18L ). Hypokalemia. Bilateral renal calculi. P:   Goal neg 1-1.5L in 24 hours Renal following Trend BMP Increase Lasix to 80 q6h K 10 x 3 + 20 x 1 D/c Bicarbonate gtt  GASTROINTESTINAL A: Nutrition. GI Px. P:   NPO Start TF per Nutritionist Protonix Bowel regimen  HEMATOLOGIC A:   Thrombocytopenia, likely sepsis related. Anticoagulation for ACS. VTE Px is not indicated. P: Trend CBC Heparin per pharmacy  INFECTIOUS A:   Abdominal wall cellulitis, myositis and group A strep bacteremia. Aspiration pneumonia. P:   Widen abx coverage D/c Pen G Restart Vancomycin / Zosyn   ENDOCRINE A:  Hyperglycemia, likely stress related. P:   Start SSI  NEUROLOGIC A: Pain. Ventilator synchrony. P:   D/c Propofol as may be contributing to hypotension Fentanyl gtt Start Versed PRN  I have personally obtained history, examined patient, evaluated and interpreted laboratory and imaging results, reviewed medical records, formulated assessment / plan and placed orders.  CRITICAL CARE:  The patient is critically ill with multiple  organ systems failure and requires high complexity decision making for assessment and support, frequent evaluation and titration of therapies, application of advanced monitoring technologies and extensive interpretation of multiple databases. Critical Care Time devoted to patient care services described in this note is 40 minutes.   Lonia Farber, MD Pulmonary and Critical Care Medicine [facility] Pager: [phone]  08/01/2013, 9:07 AM

## 2013-08-01 NOTE — Progress Notes (Signed)
ANTICOAGULATION CONSULT NOTE - Follow- up Consult  Pharmacy Consult for Heparin Indication: chest pain/ACS  No Known Allergies  Patient Measurements: Height: 5\' 11"  (180.3 cm) Weight:  (unable to weight pt due to bed) IBW/kg (Calculated) : 75.3 Heparin Dosing Weight: 100 kg  Vital Signs: Temp: 99.1 F (37.3 C) (01/02 1200) Temp src: Oral (01/02 1200) BP: 119/66 mmHg (01/02 0829) Pulse Rate: 98 (01/02 1300)  Labs:  Recent Labs  07/30/13 0446 07/31/13 0425 07/31/13 0915  07/31/13 1625 07/31/13 1720 08/01/13 0300 08/01/13 0340 08/01/13 0440 08/01/13 1300  HGB 12.8* 12.0*  --   --   --   --   --  12.8*  --   --   HCT 35.0* 32.9*  --   --   --   --   --  34.8*  --   --   PLT 136* 108*  --   --   --   --   --  128*  --   --   LABPROT  --   --  15.1  --   --   --   --   --   --   --   INR  --   --  1.22  --   --   --   --   --   --   --   HEPARINUNFRC  --   --   --   < >  --  <0.10* <0.10*  --   --  0.14*  CREATININE 5.15* 5.89*  --   --   --   --   --   --  5.74*  --   CKTOTAL 1565* 903*  --   --   --   --   --   --   --   --   TROPONINI  --  9.33* >20.00*  --  >20.00*  --   --   --   --   --   < > = values in this interval not displayed.  Estimated Creatinine Clearance: 20.7 ml/min (by C-G formula based on Cr of 5.74).  Medications:  Scheduled:  . antiseptic oral rinse  15 mL Mouth Rinse QID  . aspirin  81 mg Oral Daily  . atorvastatin  20 mg Oral q1800  . chlorhexidine  15 mL Mouth/Throat BID  . clindamycin (CLEOCIN) IV  600 mg Intravenous Q8H  . docusate sodium  100 mg Oral BID  . feeding supplement (PRO-STAT SUGAR FREE 64)  30 mL Per Tube BID  . furosemide  80 mg Intravenous Q6H  . insulin aspart  0-15 Units Subcutaneous Q4H  . lidocaine  2 patch Transdermal Q24H  . pantoprazole (PROTONIX) IV  40 mg Intravenous Daily  . piperacillin-tazobactam (ZOSYN)  IV  2.25 g Intravenous Q6H  . polyethylene glycol  17 g Oral BID  . sodium chloride  3 mL Intravenous Q12H     Assessment: 43 yo M with chest pain/EKG changes. Pharmacy consulted to dose heparin gtt.  After heparin level undetectable when rate at 1600 units/hr, a bolus of 3000 units was given followed with a rate increase to 2000 units/hr.  Now a 6 hr HL results at 0.14, still subtherapeutic but slightly early draw considering patient's renal function.  H/H remains low but stable, and plt are low but trending up.  No bleeding issues have been reported.  Nurse re-checked line and no problems.  Goal of Therapy:  Heparin level 0.3-0.7 units/ml Monitor platelets by  anticoagulation protocol: Yes   Plan:  - give heparin IV 3000 unit bolus, followed by increase in rate to 2350 units/hr - recheck 8h HL - daily HL and CBC - monitor for s/s of bleeding  Harrold Donath E. Achilles Dunk, PharmD Clinical Pharmacist - Resident Pager: [phone] Pharmacy: [phone] 08/01/2013 3:21 PM

## 2013-08-01 NOTE — Progress Notes (Signed)
Transported patient to C.T scan on ventilator with 100% oxygen. Patient remained stable on the vent during transport

## 2013-08-02 ENCOUNTER — Inpatient Hospital Stay (HOSPITAL_COMMUNITY): Payer: BC Managed Care – PPO

## 2013-08-02 DIAGNOSIS — A419 Sepsis, unspecified organism: Secondary | ICD-10-CM

## 2013-08-02 DIAGNOSIS — A409 Streptococcal sepsis, unspecified: Principal | ICD-10-CM

## 2013-08-02 LAB — DIFFERENTIAL
BASOS ABS: 0.4 10*3/uL — AB (ref 0.0–0.1)
Basophils Relative: 1 % (ref 0–1)
Eosinophils Absolute: 0.3 10*3/uL (ref 0.0–0.7)
Eosinophils Relative: 1 % (ref 0–5)
LYMPHS PCT: 6 % — AB (ref 12–46)
Lymphs Abs: 1.8 10*3/uL (ref 0.7–4.0)
Monocytes Absolute: 0.4 10*3/uL (ref 0.1–1.0)
Monocytes Relative: 1 % — ABNORMAL LOW (ref 3–12)
NEUTROS PCT: 91 % — AB (ref 43–77)
Neutro Abs: 27.8 10*3/uL — ABNORMAL HIGH (ref 1.7–7.7)
WBC Morphology: INCREASED

## 2013-08-02 LAB — BASIC METABOLIC PANEL
BUN: 88 mg/dL — ABNORMAL HIGH (ref 6–23)
CALCIUM: 7.3 mg/dL — AB (ref 8.4–10.5)
CO2: 26 meq/L (ref 19–32)
CREATININE: 5.47 mg/dL — AB (ref 0.50–1.35)
Chloride: 90 mEq/L — ABNORMAL LOW (ref 96–112)
GFR calc Af Amer: 13 mL/min — ABNORMAL LOW (ref >90)
GFR calc non Af Amer: 12 mL/min — ABNORMAL LOW (ref >90)
GLUCOSE: 132 mg/dL — AB (ref 70–99)
Potassium: 3 mEq/L — ABNORMAL LOW (ref 3.7–5.3)
Sodium: 133 mEq/L — ABNORMAL LOW (ref 137–147)

## 2013-08-02 LAB — POCT I-STAT 3, ART BLOOD GAS (G3+)
ACID-BASE EXCESS: 6 mmol/L — AB (ref 0.0–2.0)
BICARBONATE: 30.3 meq/L — AB (ref 20.0–24.0)
O2 Saturation: 99 %
PH ART: 7.449 (ref 7.350–7.450)
PO2 ART: 135 mmHg — AB (ref 80.0–100.0)
TCO2: 32 mmol/L (ref 0–100)
pCO2 arterial: 43.7 mmHg (ref 35.0–45.0)

## 2013-08-02 LAB — GLUCOSE, CAPILLARY
GLUCOSE-CAPILLARY: 119 mg/dL — AB (ref 70–99)
GLUCOSE-CAPILLARY: 123 mg/dL — AB (ref 70–99)
GLUCOSE-CAPILLARY: 139 mg/dL — AB (ref 70–99)
Glucose-Capillary: 158 mg/dL — ABNORMAL HIGH (ref 70–99)
Glucose-Capillary: 115 mg/dL — ABNORMAL HIGH (ref 70–99)

## 2013-08-02 LAB — HEPARIN LEVEL (UNFRACTIONATED)
HEPARIN UNFRACTIONATED: 0.61 [IU]/mL (ref 0.30–0.70)
Heparin Unfractionated: <0.1 IU/mL — ABNORMAL LOW (ref 0.30–0.70)
Heparin Unfractionated: 0.57 IU/mL (ref 0.30–0.70)

## 2013-08-02 LAB — CBC
HCT: 34.1 % — ABNORMAL LOW (ref 39.0–52.0)
Hemoglobin: 12.5 g/dL — ABNORMAL LOW (ref 13.0–17.0)
MCH: 30.3 pg (ref 26.0–34.0)
MCHC: 36.7 g/dL — ABNORMAL HIGH (ref 30.0–36.0)
MCV: 82.8 fL (ref 78.0–100.0)
PLATELETS: 124 10*3/uL — AB (ref 150–400)
RBC: 4.12 MIL/uL — AB (ref 4.22–5.81)
RDW: 13.4 % (ref 11.5–15.5)
WBC: 30 10*3/uL — ABNORMAL HIGH (ref 4.0–10.5)

## 2013-08-02 LAB — PROCALCITONIN: Procalcitonin: 21.76 ng/mL

## 2013-08-02 MED ORDER — POTASSIUM CHLORIDE 10 MEQ/50ML IV SOLN
10.0000 meq | INTRAVENOUS | Status: AC
  Administered 2013-08-02 (×4): 10 meq via INTRAVENOUS
  Filled 2013-08-02 (×3): qty 50

## 2013-08-02 MED ORDER — HEPARIN BOLUS VIA INFUSION
4000.0000 [IU] | Freq: Once | INTRAVENOUS | Status: AC
  Administered 2013-08-02: 4000 [IU] via INTRAVENOUS
  Filled 2013-08-02: qty 4000

## 2013-08-02 MED ORDER — SODIUM PHOSPHATE 3 MMOLE/ML IV SOLN: 20.0000 mmol | Freq: Once | INTRAVENOUS | Status: DC

## 2013-08-02 MED ORDER — DOCUSATE SODIUM 50 MG/5ML PO LIQD
100.0000 mg | Freq: Two times a day (BID) | ORAL | Status: DC
  Filled 2013-08-02 (×4): qty 10

## 2013-08-02 MED ORDER — POTASSIUM CHLORIDE 20 MEQ/15ML (10%) PO LIQD
40.0000 meq | Freq: Once | ORAL | Status: AC
  Administered 2013-08-02: 40 meq
  Filled 2013-08-02: qty 30

## 2013-08-02 MED ORDER — POTASSIUM CHLORIDE 10 MEQ/50ML IV SOLN
INTRAVENOUS | Status: AC
  Administered 2013-08-02: 10 meq
  Filled 2013-08-02: qty 50

## 2013-08-02 MED ORDER — POTASSIUM CHLORIDE 20 MEQ/15ML (10%) PO LIQD
40.0000 meq | Freq: Two times a day (BID) | ORAL | Status: DC
  Filled 2013-08-02 (×2): qty 30

## 2013-08-02 NOTE — Progress Notes (Signed)
Patient ID: Andrew Hogan, male   DOB: 08/04/1970, 43 y.o.   MRN: [id number]   SUBJECTIVE: Patient awake and alert on vent this morning, communicating via paper/pen.  BP improved, norepinephrine decreased, very good UOP on Lasix.  Troponin increased to > 20.  Echo showed preserved EF and wall motion.  CVP 10.  ST elevation resolved with no Qs on ECG.   Marland Kitchen. antiseptic oral rinse  15 mL Mouth Rinse QID  . aspirin  81 mg Oral Daily  . atorvastatin  20 mg Oral q1800  . chlorhexidine  15 mL Mouth/Throat BID  . clindamycin (CLEOCIN) IV  600 mg Intravenous Q8H  . docusate sodium  100 mg Oral BID  . feeding supplement (PRO-STAT SUGAR FREE 64)  30 mL Per Tube BID  . furosemide  80 mg Intravenous Q6H  . insulin aspart  0-15 Units Subcutaneous Q4H  . lidocaine  2 patch Transdermal Q24H  . pantoprazole (PROTONIX) IV  40 mg Intravenous Daily  . piperacillin-tazobactam (ZOSYN)  IV  2.25 g Intravenous Q6H  . polyethylene glycol  17 g Oral BID  . sodium chloride  3 mL Intravenous Q12H  Heparin gtt Norepinephrine @ 8    Filed Vitals:   08/02/13 0400 08/02/13 0416 08/02/13 0500 08/02/13 0600  BP:      Pulse: 95 89 92 94  Temp: 99 F (37.2 C)     TempSrc: Axillary     Resp: 28 28 28 28   Height:      Weight:      SpO2: 100% 100% 100% 100%    Intake/Output Summary (Last 24 hours) at 08/02/13 0725 Last data filed at 08/02/13 0600  Gross per 24 hour  Intake 2412.51 ml  Output   7975 ml  Net -5562.49 ml    LABS: Basic Metabolic Panel:  Recent Labs  40/98/1099/08/14 0425 08/01/13 0440 08/01/13 1420 08/02/13 0500  NA 131* 130* 131* 133*  K 3.2* 2.9* 3.4* 3.0*  CL 87* 88* 90* 90*  CO2 21 23 25 26   GLUCOSE 113* 149* 112* 132*  BUN 83* 85* 88* 88*  CREATININE 5.89* 5.74* 5.67* 5.47*  CALCIUM 7.0* 6.5* 7.0* 7.3*  MG  --  2.1  --   --    Liver Function Tests:  Recent Labs  07/31/13 0425  AST 69*  ALT 39  ALKPHOS 87  BILITOT 2.2*  PROT 5.1*  ALBUMIN 1.4*   No results found for this basename:  LIPASE, AMYLASE,  in the last 72 hours CBC:  Recent Labs  08/01/13 0340 08/01/13 1517 08/02/13 0500  WBC 33.6*  --  30.0*  NEUTROABS  --  28.9*  --   HGB 12.8*  --  12.5*  HCT 34.8*  --  34.1*  MCV 83.3  --  82.8  PLT 128*  --  124*   Cardiac Enzymes:  Recent Labs  07/31/13 0425 07/31/13 0915 07/31/13 1625  CKTOTAL 903*  --   --   TROPONINI 9.33* >20.00* >20.00*   BNP: No components found with this basename: POCBNP,  D-Dimer:  Recent Labs  07/31/13 0915  DDIMER 12.62*   Hemoglobin A1C: No results found for this basename: HGBA1C,  in the last 72 hours Fasting Lipid Panel: No results found for this basename: CHOL, HDL, LDLCALC, TRIG, CHOLHDL, LDLDIRECT,  in the last 72 hours Thyroid Function Tests: No results found for this basename: TSH, T4TOTAL, FREET3, T3FREE, THYROIDAB,  in the last 72 hours Anemia Panel: No results found for this  basename: VITAMINB12, FOLATE, FERRITIN, TIBC, IRON, RETICCTPCT,  in the last 72 hours  RADIOLOGY: Ct Abdomen Pelvis Wo Contrast  07/31/2013   CLINICAL DATA:  Evaluate for gas in the right flank.  EXAM: CT CHEST, ABDOMEN AND PELVIS WITHOUT CONTRAST  TECHNIQUE: Multidetector CT imaging of the chest, abdomen and pelvis was performed following the standard protocol without IV contrast.  COMPARISON:  No priors.  FINDINGS: CT CHEST FINDINGS  Mediastinum: Left internal jugular central venous catheter with tip terminating in the mid superior vena cava. Endotracheal tube with tip in the upper trachea shortly above the level of the aortic arch. Nasogastric tube in the body of the stomach. Heart size is borderline enlarged. Trace amount of pericardial fluid or thickening, unlikely to be of hemodynamic significance at this time. No associated pericardial calcification. No pathologically enlarged mediastinal or hilar lymph nodes. Please note that accurate exclusion of hilar adenopathy is limited on noncontrast CT scans. Esophagus is unremarkable in  appearance.  Lungs/Pleura: Small bilateral pleural effusions (right greater than left) layering dependently with some associated passive atelectasis throughout the lower lobes of the lungs bilaterally. Some superimposed airspace consolidation in the lower lobes of the lungs bilaterally is also suspected, potentially related to mild aspiration. Several foci of peribronchovascular ground-glass attenuation are also noted in the right middle lobe, which could represent some endobronchial spread of infection/inflammation. No other suspicious appearing pulmonary nodules or masses are noted. No pneumothorax.  Musculoskeletal: There are no aggressive appearing lytic or blastic lesions noted in the visualized portions of the skeleton.  CT ABDOMEN AND PELVIS FINDINGS  Abdomen/Pelvis: Multiple nonobstructive calculi are noted within the collecting systems of the kidneys bilaterally, largest of which is in the lower pole collecting system of the left kidney measuring 1 cm. No ureteral stones or findings of urinary tract obstruction are noted at this time. The unenhanced appearance of the kidneys is otherwise unremarkable bilaterally. Several sub cm low-attenuation hepatic lesions are noted, but are incompletely characterized on today's non contrast CT examination. Diffuse decreased attenuation throughout the hepatic parenchyma, indicative of hepatic steatosis. The unenhanced appearance of the gallbladder, pancreas, spleen and bilateral adrenal glands is unremarkable.  Trace volume of free fluid in the pelvis. No larger volume of ascites. No pneumoperitoneum. No pathologic distention of small bowel. No definite lymphadenopathy identified within the abdomen or pelvis on today's non contrast CT examination. Prostate gland and urinary bladder are unremarkable in appearance. A Foley balloon catheter is present with tip in the lumen of the urinary bladder.  Musculoskeletal: Diffuse body wall edema. No definite gas identified within  the soft tissues of the right flank. There are no aggressive appearing lytic or blastic lesions noted in the visualized portions of the skeleton.  IMPRESSION: 1. No subcutaneous or intramuscular gas identified within the soft tissues of the right flank. 2. Diffuse body wall edema with small bilateral pleural effusions, trace ascites and trace amount of pericardial fluid, likely to reflect a state of anasarca. 3. In addition to the areas of passive atelectasis in the lower lobes of the lungs bilaterally there is likely some superimposed airspace consolidation, as well as small foci of airspace disease in the right middle lobe, concerning for sequela of aspiration. 4. Hepatic steatosis. 5. Nonobstructive calculi within the collecting systems of the kidneys bilaterally, largest of which measures 1 cm in the lower pole of the left kidney. No ureteral stones or findings of urinary tract obstruction at this time. 6. Support apparatus, as above.   Electronically Signed  By: Trudie Reed M.D.   On: 07/31/2013 23:08   Ct Abdomen Pelvis Wo Contrast  07/29/2013   CLINICAL DATA:  Severe pain in the right side. No injury. Redness and swelling of the skin with suspected cellulitis. Renal failure.  EXAM: CT CHEST WITHOUT CONTRAST; CT ABDOMEN AND PELVIS WITHOUT CONTRAST  TECHNIQUE: Multidetector CT imaging of the chest was performed following the standard protocol without IV contrast.; Multidetector CT imaging of the abdomen and pelvis was performed following the standard protocol without intravenous contrast.  COMPARISON:  None.  FINDINGS: CT chest: There is extensive infiltration in the subcutaneous fat and superficial muscle layers involving the right shoulder, right axilla, right lateral chest wall, right abdominal wall and right flank extending posteriorly to the level of the midline over the spinous process. Changes extend from the shoulder down to the right groin. This is consistent with extensive cellulitis, edema,  and/or contusion. There is no discrete fluid collection that would suggest evidence of a focal abscess. Fluid and edema is demonstrated throughout the area and in between the muscle layers.  Normal heart size. Normal caliber thoracic aorta. No significant lymphadenopathy in the chest. Atelectasis in both lung bases. No pneumothorax or effusion. Esophagus is decompressed. Esophageal diverticulum may be present. Normal alignment of the thoracic spine. Sternum and ribs appear intact.  CT abdomen and pelvis: Right flank and abdominal wall changes as previously described above. Diffuse fatty infiltration of the liver. Multiple stones demonstrated in both kidneys with stones also demonstrated in the right renal pelvis. No evidence of pyelocaliectasis or ureterectasis. No ureteral stones are visualized. Small accessory spleen. The gallbladder demonstrates somewhat diffuse increased density throughout, suggesting sludge or milk of calcium. The unenhanced appearance of the spleen, pancreas, adrenal glands, abdominal aorta, inferior vena cava, and retroperitoneal lymph nodes is unremarkable the stomach, small bowel, and colon are not abnormally distended. No free air or free fluid in the abdomen. No infiltration in the intra-abdominal fat.  Pelvis: Prostate gland is not enlarged. Bladder wall is not thickened. No free or loculated pelvic fluid collections. The appendix is normal. No evidence of diverticulitis. Mild degenerative changes in the lumbar spine with normal alignment. Pelvis, sacrum thumb and hips appear intact. Benign-appearing area of sclerosis in the right hemipelvis.  IMPRESSION: Extensive subcutaneous soft tissue and intramuscular cellulitis, infiltration/edema, or contusion along the right side of the chest and abdomen wall extending from the level of the shoulder to the pelvis. No discrete abscess is identified.   Electronically Signed   By: Burman Nieves M.D.   On: 07/29/2013 04:37   Dg Chest 2  View  07/30/2013   CLINICAL DATA:  Pain.  EXAM: CHEST  2 VIEW  COMPARISON:  Chest x-ray 07/30/2013.  Chest CT 07/29/2013.  FINDINGS: Prominent bibasilar atelectasis and/or pneumonia noted. No pneumothorax. Stable cardiomegaly, no pulmonary venous distention or prominent pleural effusion. Mild pleural thickening present. No acute osseous abnormality. Degenerative changes thoracic spine.  IMPRESSION: Dense bibasilar atelectasis and/or infiltrates.   Electronically Signed   By: Maisie Fus  Register   On: 07/30/2013 14:09   Dg Abd 1 View  07/30/2013   CLINICAL DATA:  Pain.  EXAM: ABDOMEN - 1 VIEW  COMPARISON:  CT 07/29/2013.  FINDINGS: As noted on CT diffuse right side secondary soft tissue swelling is present. Air-filled nondilated loops of small and large bowel noted. This is a nonspecific finding. Bilateral nephrolithiasis. No acute bony abnormality. Sclerotic density noted in the right ilium most consistent with bone island. No other  sclerotic bony densities noted to suggest metastatic disease.  IMPRESSION: 1. Soft tissue swelling of the right abdomen subcutaneous tissue, reference made to recent CT report. 2. Bilateral nephrolithiasis. 3. Nonspecific nondistended air-filled loops of small large bowel noted. 4. Sclerotic density node in the right iliac wing consistent with bone island .   Electronically Signed   By: Maisie Fus  Register   On: 07/30/2013 13:33   Ct Chest Wo Contrast  07/31/2013   CLINICAL DATA:  Evaluate for gas in the right flank.  EXAM: CT CHEST, ABDOMEN AND PELVIS WITHOUT CONTRAST  TECHNIQUE: Multidetector CT imaging of the chest, abdomen and pelvis was performed following the standard protocol without IV contrast.  COMPARISON:  No priors.  FINDINGS: CT CHEST FINDINGS  Mediastinum: Left internal jugular central venous catheter with tip terminating in the mid superior vena cava. Endotracheal tube with tip in the upper trachea shortly above the level of the aortic arch. Nasogastric tube in the body  of the stomach. Heart size is borderline enlarged. Trace amount of pericardial fluid or thickening, unlikely to be of hemodynamic significance at this time. No associated pericardial calcification. No pathologically enlarged mediastinal or hilar lymph nodes. Please note that accurate exclusion of hilar adenopathy is limited on noncontrast CT scans. Esophagus is unremarkable in appearance.  Lungs/Pleura: Small bilateral pleural effusions (right greater than left) layering dependently with some associated passive atelectasis throughout the lower lobes of the lungs bilaterally. Some superimposed airspace consolidation in the lower lobes of the lungs bilaterally is also suspected, potentially related to mild aspiration. Several foci of peribronchovascular ground-glass attenuation are also noted in the right middle lobe, which could represent some endobronchial spread of infection/inflammation. No other suspicious appearing pulmonary nodules or masses are noted. No pneumothorax.  Musculoskeletal: There are no aggressive appearing lytic or blastic lesions noted in the visualized portions of the skeleton.  CT ABDOMEN AND PELVIS FINDINGS  Abdomen/Pelvis: Multiple nonobstructive calculi are noted within the collecting systems of the kidneys bilaterally, largest of which is in the lower pole collecting system of the left kidney measuring 1 cm. No ureteral stones or findings of urinary tract obstruction are noted at this time. The unenhanced appearance of the kidneys is otherwise unremarkable bilaterally. Several sub cm low-attenuation hepatic lesions are noted, but are incompletely characterized on today's non contrast CT examination. Diffuse decreased attenuation throughout the hepatic parenchyma, indicative of hepatic steatosis. The unenhanced appearance of the gallbladder, pancreas, spleen and bilateral adrenal glands is unremarkable.  Trace volume of free fluid in the pelvis. No larger volume of ascites. No  pneumoperitoneum. No pathologic distention of small bowel. No definite lymphadenopathy identified within the abdomen or pelvis on today's non contrast CT examination. Prostate gland and urinary bladder are unremarkable in appearance. A Foley balloon catheter is present with tip in the lumen of the urinary bladder.  Musculoskeletal: Diffuse body wall edema. No definite gas identified within the soft tissues of the right flank. There are no aggressive appearing lytic or blastic lesions noted in the visualized portions of the skeleton.  IMPRESSION: 1. No subcutaneous or intramuscular gas identified within the soft tissues of the right flank. 2. Diffuse body wall edema with small bilateral pleural effusions, trace ascites and trace amount of pericardial fluid, likely to reflect a state of anasarca. 3. In addition to the areas of passive atelectasis in the lower lobes of the lungs bilaterally there is likely some superimposed airspace consolidation, as well as small foci of airspace disease in the right middle lobe,  concerning for sequela of aspiration. 4. Hepatic steatosis. 5. Nonobstructive calculi within the collecting systems of the kidneys bilaterally, largest of which measures 1 cm in the lower pole of the left kidney. No ureteral stones or findings of urinary tract obstruction at this time. 6. Support apparatus, as above.   Electronically Signed   By: Trudie Reed M.D.   On: 07/31/2013 23:08   Ct Chest Wo Contrast  07/29/2013   CLINICAL DATA:  Severe pain in the right side. No injury. Redness and swelling of the skin with suspected cellulitis. Renal failure.  EXAM: CT CHEST WITHOUT CONTRAST; CT ABDOMEN AND PELVIS WITHOUT CONTRAST  TECHNIQUE: Multidetector CT imaging of the chest was performed following the standard protocol without IV contrast.; Multidetector CT imaging of the abdomen and pelvis was performed following the standard protocol without intravenous contrast.  COMPARISON:  None.  FINDINGS: CT  chest: There is extensive infiltration in the subcutaneous fat and superficial muscle layers involving the right shoulder, right axilla, right lateral chest wall, right abdominal wall and right flank extending posteriorly to the level of the midline over the spinous process. Changes extend from the shoulder down to the right groin. This is consistent with extensive cellulitis, edema, and/or contusion. There is no discrete fluid collection that would suggest evidence of a focal abscess. Fluid and edema is demonstrated throughout the area and in between the muscle layers.  Normal heart size. Normal caliber thoracic aorta. No significant lymphadenopathy in the chest. Atelectasis in both lung bases. No pneumothorax or effusion. Esophagus is decompressed. Esophageal diverticulum may be present. Normal alignment of the thoracic spine. Sternum and ribs appear intact.  CT abdomen and pelvis: Right flank and abdominal wall changes as previously described above. Diffuse fatty infiltration of the liver. Multiple stones demonstrated in both kidneys with stones also demonstrated in the right renal pelvis. No evidence of pyelocaliectasis or ureterectasis. No ureteral stones are visualized. Small accessory spleen. The gallbladder demonstrates somewhat diffuse increased density throughout, suggesting sludge or milk of calcium. The unenhanced appearance of the spleen, pancreas, adrenal glands, abdominal aorta, inferior vena cava, and retroperitoneal lymph nodes is unremarkable the stomach, small bowel, and colon are not abnormally distended. No free air or free fluid in the abdomen. No infiltration in the intra-abdominal fat.  Pelvis: Prostate gland is not enlarged. Bladder wall is not thickened. No free or loculated pelvic fluid collections. The appendix is normal. No evidence of diverticulitis. Mild degenerative changes in the lumbar spine with normal alignment. Pelvis, sacrum thumb and hips appear intact. Benign-appearing area of  sclerosis in the right hemipelvis.  IMPRESSION: Extensive subcutaneous soft tissue and intramuscular cellulitis, infiltration/edema, or contusion along the right side of the chest and abdomen wall extending from the level of the shoulder to the pelvis. No discrete abscess is identified.   Electronically Signed   By: Burman Nieves M.D.   On: 07/29/2013 04:37   US Renal  07/30/2013   CLINICAL DATA:  Acute renal insufficiency.  Elevated creatinine.  EXAM: RENAL/URINARY TRACT ULTRASOUND COMPLETE  COMPARISON:  CT 07/29/2013.  FINDINGS: Right Kidney:  Length: 12.3 cm. Echogenicity within normal limits. No mass or hydronephrosis visualized.  Left Kidney:  Length: 13.1 cm. Echogenicity within normal limits. 6 mm stone over the lower pole. No mass or hydronephrosis visualized.  Bladder:  Decompressed as Foley catheter is present.  IMPRESSION: Normal size kidneys without evidence of hydronephrosis.  6 mm stone over the left lower pole.   Electronically Signed   By: Reuel Boom  Micheline Maze M.D.   On: 07/30/2013 21:54   Portable Chest Xray  07/31/2013   CLINICAL DATA:  Intubation.  EXAM: PORTABLE CHEST - 1 VIEW  COMPARISON:  Earlier the same date.  FINDINGS: 1418 hr. The endotracheal tube is well positioned in the midtrachea. A nasogastric tube projects below the diaphragm. Left IJ central venous catheter is unchanged at the level of the mid SVC. There is persistent pulmonary edema with increased opacity at the left lung base, likely due to a combination of pleural fluid and basilar airspace disease. No pneumothorax is evident. The heart size and mediastinal contours are stable.  IMPRESSION: Satisfactory position of the newly placed endotracheal and nasogastric tubes. Increased left basilar airspace disease and probable adjacent pleural fluid.   Electronically Signed   By: Roxy Horseman M.D.   On: 07/31/2013 14:34   Dg Chest Port 1 View  07/31/2013   CLINICAL DATA:  Shortness of breath  EXAM: PORTABLE CHEST - 1 VIEW   COMPARISON:  07/31/2013  FINDINGS: Left-sided jugular central venous catheter in unchanged position. Bilateral interstitial and alveolar airspace opacities with bilateral central pulmonary vascular prominence. No pleural effusions or pneumothorax. Stable cardiomediastinal silhouette. Unremarkable osseous structures.  IMPRESSION: Bilateral interstitial and alveolar airspace opacities as can be seen with an infectious or inflammatory process versus pulmonary edema.   Electronically Signed   By: Elige Ko   On: 07/31/2013 11:38   Dg Chest Port 1 View  07/31/2013   CLINICAL DATA:  Shortness of breath, status post line placement  EXAM: PORTABLE CHEST - 1 VIEW  COMPARISON:  07/30/2013  FINDINGS: There is a left-sided jugular central venous catheter with the tip projecting over the confluence of the left brachiocephalic and SVC. There are bilateral interstitial and alveolar airspace opacities primarily at the lung bases. There is no pleural effusion or pneumothorax. Stable cardiomediastinal silhouette. The osseous structures are unremarkable.  IMPRESSION: 1. Left-sided jugular central venous catheter with the tip projecting over the confluence of the left brachiocephalic and SVC. 2. Bilateral interstitial and alveolar airspace opacities as can be seen with an infectious or inflammatory etiology versus interstitial edema.   Electronically Signed   By: Elige Ko   On: 07/31/2013 07:59   Dg Chest Port 1 View  07/30/2013   CLINICAL DATA:  Shortness of breath  EXAM: PORTABLE CHEST - 1 VIEW  COMPARISON:  07/28/2013  FINDINGS: The heart size and mediastinal contours are within normal limits. Both lungs are clear. The visualized skeletal structures are unremarkable.  IMPRESSION: No active disease.   Electronically Signed   By: Alcide Clever M.D.   On: 07/30/2013 08:13   Dg Chest Portable 1 View  07/28/2013   CLINICAL DATA:  Chest pain  EXAM: PORTABLE CHEST - 1 VIEW  COMPARISON:  None.  FINDINGS: The heart size and  mediastinal contours are within normal limits. There is no focal infiltrate, pulmonary edema, or pleural effusion. The visualized skeletal structures are unremarkable.  IMPRESSION: No active disease.   Electronically Signed   By: Sherian Rein M.D.   On: 07/28/2013 19:37    PHYSICAL EXAM General: intubated, sedated  Neck: JVP 8 cm Lungs: Decreased breath sounds at bases.  CV: Nondisplaced PMI.  Heart regular S1/S2, no S3/S4, no murmur.  1+ edema lower legs.   Abdomen: Soft,  no hepatosplenomegaly, no distention.  Extremities: No clubbing or cyanosis.  Skin: Erythma and blistering on right flank.  TELEMETRY: Reviewed telemetry pt in NSR  ASSESSMENT AND PLAN: 43 yo  with minimal PMH presented with extensive right chest wall and abdominal wall cellulitis/myositis with GAS bacteremia. On 1/1, he developed hypotension and ECG changes and was intubated and started on pressors.  1. ID: GAS bacteremia with extensive cellulitis/myositis right chest wall/abdominal wall. Septic shock now on norepinephrine. 2. AKI: Suspect this is due to septic shock + extensive NSAID use at home. UOP improved with higher BP and IV Lasix (CVP 10).  Creatinine stably elevated. 3. Cardiac: Elevated TnI and ST elevation in the setting of septic shock. No chest pain. Patient does not have risk factors for CAD. Echo done 1/1 showed normal EF and no wall motion abnormalities.  ST elevation resolved with improvement in BP on norepinephrine and ECG 1/2 showed no STE and no Q waves.  The elevation in troponin may all represent demand ischemia with potential septic cardiomyopathy.  Manage noninvasively for now.  Can continue ASA 81 and statin, would stop heparin gtt after today.   Marca Ancona 08/02/2013 7:25 AM

## 2013-08-02 NOTE — Progress Notes (Signed)
Patient ID: Andrew Hogan, male   DOB: 06-22-1971, 43 y.o.   MRN: [id number]    Subjective: Pt awake on vent and looks much better clinically than on the computer.  Denies chest wall, flank pain.  Objective: Vital signs in last 24 hours: Temp:  [99 F (37.2 C)-99.3 F (37.4 C)] 99 F (37.2 C) (01/03 0400) Pulse Rate:  [89-118] 94 (01/03 0600) Resp:  [15-34] 28 (01/03 0600) BP: (119-137)/(66-74) 137/74 mmHg (01/02 1619) SpO2:  [98 %-100 %] 100 % (01/03 0600) Arterial Line BP: (111-148)/(64-84) 143/68 mmHg (01/03 0600) FiO2 (%):  [40 %-50 %] 40 % (01/03 0416) Last BM Date: 08/01/13  Intake/Output from previous [facility]: 01/02 0701 - 01/03 0700 In: 2487.5 [I.V.:1386.2; NG/GT:601.3; IV Piggyback:500] Out: 7975 [Urine:7905; Emesis/NG output:70] Intake/Output this shift:    PE: Skin: induration, anasarca, and faint erythema noted from right chest wall down into right groin and buttocks.  There are multiple bullae noted.  No significant drainage at this time.  Lab Results:   Recent Labs  08/01/13 0340 08/02/13 0500  WBC 33.6* 30.0*  HGB 12.8* 12.5*  HCT 34.8* 34.1*  PLT 128* 124*   BMET  Recent Labs  08/01/13 1420 08/02/13 0500  NA 131* 133*  K 3.4* 3.0*  CL 90* 90*  CO2 25 26  GLUCOSE 112* 132*  BUN 88* 88*  CREATININE 5.67* 5.47*  CALCIUM 7.0* 7.3*   PT/INR  Recent Labs  07/31/13 0915  LABPROT 15.1  INR 1.22   CMP     Component Value Date/Time   NA 133* 08/02/2013 0500   K 3.0* 08/02/2013 0500   CL 90* 08/02/2013 0500   CO2 26 08/02/2013 0500   GLUCOSE 132* 08/02/2013 0500   BUN 88* 08/02/2013 0500   CREATININE 5.47* 08/02/2013 0500   CALCIUM 7.3* 08/02/2013 0500   PROT 5.1* 07/31/2013 0425   ALBUMIN 1.4* 07/31/2013 0425   AST 69* 07/31/2013 0425   ALT 39 07/31/2013 0425   ALKPHOS 87 07/31/2013 0425   BILITOT 2.2* 07/31/2013 0425   GFRNONAA 12* 08/02/2013 0500   GFRAA 13* 08/02/2013 0500   Lipase     Component Value Date/Time   LIPASE 9* 07/29/2013 0300        Studies/Results: Ct Abdomen Pelvis Wo Contrast  07/31/2013   CLINICAL DATA:  Evaluate for gas in the right flank.  EXAM: CT CHEST, ABDOMEN AND PELVIS WITHOUT CONTRAST  TECHNIQUE: Multidetector CT imaging of the chest, abdomen and pelvis was performed following the standard protocol without IV contrast.  COMPARISON:  No priors.  FINDINGS: CT CHEST FINDINGS  Mediastinum: Left internal jugular central venous catheter with tip terminating in the mid superior vena cava. Endotracheal tube with tip in the upper trachea shortly above the level of the aortic arch. Nasogastric tube in the body of the stomach. Heart size is borderline enlarged. Trace amount of pericardial fluid or thickening, unlikely to be of hemodynamic significance at this time. No associated pericardial calcification. No pathologically enlarged mediastinal or hilar lymph nodes. Please note that accurate exclusion of hilar adenopathy is limited on noncontrast CT scans. Esophagus is unremarkable in appearance.  Lungs/Pleura: Small bilateral pleural effusions (right greater than left) layering dependently with some associated passive atelectasis throughout the lower lobes of the lungs bilaterally. Some superimposed airspace consolidation in the lower lobes of the lungs bilaterally is also suspected, potentially related to mild aspiration. Several foci of peribronchovascular ground-glass attenuation are also noted in the right middle lobe, which could represent some endobronchial  spread of infection/inflammation. No other suspicious appearing pulmonary nodules or masses are noted. No pneumothorax.  Musculoskeletal: There are no aggressive appearing lytic or blastic lesions noted in the visualized portions of the skeleton.  CT ABDOMEN AND PELVIS FINDINGS  Abdomen/Pelvis: Multiple nonobstructive calculi are noted within the collecting systems of the kidneys bilaterally, largest of which is in the lower pole collecting system of the left kidney  measuring 1 cm. No ureteral stones or findings of urinary tract obstruction are noted at this time. The unenhanced appearance of the kidneys is otherwise unremarkable bilaterally. Several sub cm low-attenuation hepatic lesions are noted, but are incompletely characterized on today's non contrast CT examination. Diffuse decreased attenuation throughout the hepatic parenchyma, indicative of hepatic steatosis. The unenhanced appearance of the gallbladder, pancreas, spleen and bilateral adrenal glands is unremarkable.  Trace volume of free fluid in the pelvis. No larger volume of ascites. No pneumoperitoneum. No pathologic distention of small bowel. No definite lymphadenopathy identified within the abdomen or pelvis on today's non contrast CT examination. Prostate gland and urinary bladder are unremarkable in appearance. A Foley balloon catheter is present with tip in the lumen of the urinary bladder.  Musculoskeletal: Diffuse body wall edema. No definite gas identified within the soft tissues of the right flank. There are no aggressive appearing lytic or blastic lesions noted in the visualized portions of the skeleton.  IMPRESSION: 1. No subcutaneous or intramuscular gas identified within the soft tissues of the right flank. 2. Diffuse body wall edema with small bilateral pleural effusions, trace ascites and trace amount of pericardial fluid, likely to reflect a state of anasarca. 3. In addition to the areas of passive atelectasis in the lower lobes of the lungs bilaterally there is likely some superimposed airspace consolidation, as well as small foci of airspace disease in the right middle lobe, concerning for sequela of aspiration. 4. Hepatic steatosis. 5. Nonobstructive calculi within the collecting systems of the kidneys bilaterally, largest of which measures 1 cm in the lower pole of the left kidney. No ureteral stones or findings of urinary tract obstruction at this time. 6. Support apparatus, as above.    Electronically Signed   By: Trudie Reed M.D.   On: 07/31/2013 23:08   Ct Chest Wo Contrast  07/31/2013   CLINICAL DATA:  Evaluate for gas in the right flank.  EXAM: CT CHEST, ABDOMEN AND PELVIS WITHOUT CONTRAST  TECHNIQUE: Multidetector CT imaging of the chest, abdomen and pelvis was performed following the standard protocol without IV contrast.  COMPARISON:  No priors.  FINDINGS: CT CHEST FINDINGS  Mediastinum: Left internal jugular central venous catheter with tip terminating in the mid superior vena cava. Endotracheal tube with tip in the upper trachea shortly above the level of the aortic arch. Nasogastric tube in the body of the stomach. Heart size is borderline enlarged. Trace amount of pericardial fluid or thickening, unlikely to be of hemodynamic significance at this time. No associated pericardial calcification. No pathologically enlarged mediastinal or hilar lymph nodes. Please note that accurate exclusion of hilar adenopathy is limited on noncontrast CT scans. Esophagus is unremarkable in appearance.  Lungs/Pleura: Small bilateral pleural effusions (right greater than left) layering dependently with some associated passive atelectasis throughout the lower lobes of the lungs bilaterally. Some superimposed airspace consolidation in the lower lobes of the lungs bilaterally is also suspected, potentially related to mild aspiration. Several foci of peribronchovascular ground-glass attenuation are also noted in the right middle lobe, which could represent some endobronchial spread of  infection/inflammation. No other suspicious appearing pulmonary nodules or masses are noted. No pneumothorax.  Musculoskeletal: There are no aggressive appearing lytic or blastic lesions noted in the visualized portions of the skeleton.  CT ABDOMEN AND PELVIS FINDINGS  Abdomen/Pelvis: Multiple nonobstructive calculi are noted within the collecting systems of the kidneys bilaterally, largest of which is in the lower pole  collecting system of the left kidney measuring 1 cm. No ureteral stones or findings of urinary tract obstruction are noted at this time. The unenhanced appearance of the kidneys is otherwise unremarkable bilaterally. Several sub cm low-attenuation hepatic lesions are noted, but are incompletely characterized on today's non contrast CT examination. Diffuse decreased attenuation throughout the hepatic parenchyma, indicative of hepatic steatosis. The unenhanced appearance of the gallbladder, pancreas, spleen and bilateral adrenal glands is unremarkable.  Trace volume of free fluid in the pelvis. No larger volume of ascites. No pneumoperitoneum. No pathologic distention of small bowel. No definite lymphadenopathy identified within the abdomen or pelvis on today's non contrast CT examination. Prostate gland and urinary bladder are unremarkable in appearance. A Foley balloon catheter is present with tip in the lumen of the urinary bladder.  Musculoskeletal: Diffuse body wall edema. No definite gas identified within the soft tissues of the right flank. There are no aggressive appearing lytic or blastic lesions noted in the visualized portions of the skeleton.  IMPRESSION: 1. No subcutaneous or intramuscular gas identified within the soft tissues of the right flank. 2. Diffuse body wall edema with small bilateral pleural effusions, trace ascites and trace amount of pericardial fluid, likely to reflect a state of anasarca. 3. In addition to the areas of passive atelectasis in the lower lobes of the lungs bilaterally there is likely some superimposed airspace consolidation, as well as small foci of airspace disease in the right middle lobe, concerning for sequela of aspiration. 4. Hepatic steatosis. 5. Nonobstructive calculi within the collecting systems of the kidneys bilaterally, largest of which measures 1 cm in the lower pole of the left kidney. No ureteral stones or findings of urinary tract obstruction at this time. 6.  Support apparatus, as above.   Electronically Signed   By: Trudie Reed M.D.   On: 07/31/2013 23:08   Dg Chest Port 1 View  08/02/2013   CLINICAL DATA:  Evaluate for infiltrates  EXAM: PORTABLE CHEST - 1 VIEW  COMPARISON:  July 31, 2013  FINDINGS: Endotracheal tube ends at the thoracic inlet, projecting at the level of the clavicular heads. Left IJ catheter in unchanged position. Enteric tube crosses the diaphragm.  Unchanged cardiopericardial enlargement. Haziness of the bilateral chest is relatively similar given differences in projection, pleural fluid and atelectasis based on previous CT. In the lingular region, the lung is better aerated.  IMPRESSION: 1. Good positioning of tubes and lines. 2. Layering pleural effusions, no indication of interval increase. 3. Improved left lung aeration in this patient with atelectasis and consolidation by recent CT.   Electronically Signed   By: Tiburcio Pea M.D.   On: 08/02/2013 06:12   Portable Chest Xray  07/31/2013   CLINICAL DATA:  Intubation.  EXAM: PORTABLE CHEST - 1 VIEW  COMPARISON:  Earlier the same date.  FINDINGS: 1418 hr. The endotracheal tube is well positioned in the midtrachea. A nasogastric tube projects below the diaphragm. Left IJ central venous catheter is unchanged at the level of the mid SVC. There is persistent pulmonary edema with increased opacity at the left lung base, likely due to a combination of  pleural fluid and basilar airspace disease. No pneumothorax is evident. The heart size and mediastinal contours are stable.  IMPRESSION: Satisfactory position of the newly placed endotracheal and nasogastric tubes. Increased left basilar airspace disease and probable adjacent pleural fluid.   Electronically Signed   By: Roxy HorsemanBill  Veazey M.D.   On: 07/31/2013 14:34   Dg Chest Port 1 View  07/31/2013   CLINICAL DATA:  Shortness of breath  EXAM: PORTABLE CHEST - 1 VIEW  COMPARISON:  07/31/2013  FINDINGS: Left-sided jugular central venous catheter  in unchanged position. Bilateral interstitial and alveolar airspace opacities with bilateral central pulmonary vascular prominence. No pleural effusions or pneumothorax. Stable cardiomediastinal silhouette. Unremarkable osseous structures.  IMPRESSION: Bilateral interstitial and alveolar airspace opacities as can be seen with an infectious or inflammatory process versus pulmonary edema.   Electronically Signed   By: Elige KoHetal  Patel   On: 07/31/2013 11:38    Anti-infectives: Anti-infectives   Start     Dose/Rate Route Frequency Ordered Stop   08/01/13 1300  piperacillin-tazobactam (ZOSYN) IVPB 2.25 g     2.25 g 100 mL/hr over 30 Minutes Intravenous 4 times per Merrihew 08/01/13 1123     07/31/13 0600  vancomycin (VANCOCIN) IVPB 1000 mg/200 mL premix  Status:  Discontinued     1,000 mg 200 mL/hr over 60 Minutes Intravenous Every 48 hours 07/29/13 0305 07/30/13 1109   07/30/13 2200  penicillin G potassium 4 Million Units in dextrose 5 % 250 mL IVPB  Status:  Discontinued     4 Million Units 250 mL/hr over 60 Minutes Intravenous 3 times per Mcglory 07/30/13 1258 08/01/13 0921   07/30/13 1400  clindamycin (CLEOCIN) IVPB 600 mg    Comments:  Pharmacy may adjust antiboitics as needed; GAS, ? necrotizing   600 mg 100 mL/hr over 30 Minutes Intravenous 3 times per Shehadeh 07/30/13 1109     07/30/13 1200  penicillin G potassium 4 Million Units in dextrose 5 % 250 mL IVPB    Comments:  Group A strep   4 Million Units 250 mL/hr over 60 Minutes Intravenous 6 times per Budai 07/30/13 1109 07/30/13 1516   07/29/13 0400  vancomycin (VANCOCIN) IVPB 1000 mg/200 mL premix     1,000 mg 200 mL/hr over 60 Minutes Intravenous  Once 07/29/13 0305 07/29/13 0752   07/29/13 0400  piperacillin-tazobactam (ZOSYN) IVPB 3.375 g  Status:  Discontinued     3.375 g 12.5 mL/hr over 240 Minutes Intravenous 3 times per Kost 07/29/13 0305 07/30/13 1109   07/28/13 1945  vancomycin (VANCOCIN) IVPB 1000 mg/200 mL premix     1,000 mg 200 mL/hr  over 60 Minutes Intravenous  Once 07/28/13 1934 07/28/13 2105   07/28/13 1945  piperacillin-tazobactam (ZOSYN) IVPB 3.375 g     3.375 g 12.5 mL/hr over 240 Minutes Intravenous  Once 07/28/13 1934 07/28/13 2033       Assessment/Plan  1. Chest wall, flank, inguinal cellulitis of unknown cause, improving 2. Leukocytosis, 30K today, down from 33K yesterday 3. ARF 4. MI 5. VDRF Patient Active Problem List   Diagnosis Date Noted  . ARDS (adult respiratory distress syndrome) 07/31/2013  . Acute respiratory failure with hypoxia 07/31/2013  . Septic shock(785.52) 07/31/2013  . Cellulitis 07/29/2013  . Acute renal failure 07/29/2013  . Diarrhea 07/29/2013  . Bacteremia due to Gram-positive bacteria 07/29/2013  . Severe sepsis(995.92) 07/29/2013  . Dehydration 07/29/2013  . Metabolic acidosis 07/29/2013  . Hypokalemia 07/29/2013  . Rhabdomyolysis 07/29/2013  . Fatty infiltration of  liver 07/29/2013  . Chest wall pain 07/28/2013   Plan: 1. Patient's cellulitis continues to show improvement.  His labs are worrisome, but clinically he looks much better than these findings.  Would continue conservative management now for his cellulitis with abx therapy.  Keep skin as clean and dry as possible to prevent skin breakdown. 2. Do not see any evidence that he requires surgical intervention at this time. 3. Will follow.   LOS: 5 days    Bea Duren E 08/02/2013, 8:07 AM Pager: [phone]

## 2013-08-02 NOTE — Progress Notes (Signed)
[facility]    Date of Admission:  07/28/2013   Total days of antibiotics 5        [place] 4 clinda        [place]ks 2 piptazo (previously on PenG)           ID: Andrew Hogan is a 43 y.o. male with  ARDS, AKI & STEMI 2/2 sepsis due to Group A strep Bacteremia and myositis concerning for necrotizing fasciitis  Principal Problem:   ARDS (adult respiratory distress syndrome) Active Problems:   Chest wall pain   Cellulitis   Acute renal failure   Diarrhea   Bacteremia due to Gram-positive bacteria   Severe sepsis(995.92)   Dehydration   Metabolic acidosis   Hypokalemia   Rhabdomyolysis   Fatty infiltration of liver   Acute respiratory failure with hypoxia   Septic shock(785.52)    Subjective: Remains afebrile. Was extubated and weaned off levophed. Remains on heparin gtt.  Has good urine output, diuresed 5L. Bullae and weeping is occuring from affected right chest wall/flank/upper thigh  Leukocytosis peaked at 33, now down to 30.  Medications:  . antiseptic oral rinse  15 mL Mouth Rinse QID  . aspirin  81 mg Oral Daily  . atorvastatin  20 mg Oral q1800  . chlorhexidine  15 mL Mouth/Throat BID  . clindamycin (CLEOCIN) IV  600 mg Intravenous Q8H  . docusate  100 mg Per Tube BID  . feeding supplement (PRO-STAT SUGAR FREE 64)  30 mL Per Tube BID  . insulin aspart  0-15 Units Subcutaneous Q4H  . lidocaine  2 patch Transdermal Q24H  . pantoprazole (PROTONIX) IV  40 mg Intravenous Daily  . piperacillin-tazobactam (ZOSYN)  IV  2.25 g Intravenous Q6H  . polyethylene glycol  17 g Oral BID  . potassium chloride  40 mEq Per Tube BID  . sodium chloride  3 mL Intravenous Q12H    Objective: Vital signs in last 24 hours: Temp:  [98.4 F (36.9 C)-99.3 F (37.4 C)] 98.4 F (36.9 C) (01/03 0800) Pulse Rate:  [89-118] 90 (01/03 1000) Resp:  [15-34] 28 (01/03 1000) BP: (129-137)/(64-74) 134/64 mmHg (01/03 0824) SpO2:  [98 %-100 %] 99 % (01/03 1000) Arterial Line BP:  (121-148)/(64-84) 136/69 mmHg (01/03 1000) FiO2 (%):  [40 %-50 %] 40 % (01/03 0824) Physical Exam  Constitutional: alert, soft spoken, "I feel much better". HENT:  Mouth/Throat: Oropharynx is clear and moist. No oropharyngeal exudate.  Cardiovascular: Normal rate, regular rhythm and normal heart sounds. Exam reveals no gallop and no friction rub.  No murmur heard.  Pulmonary/Chest: Effort normal and breath sounds normal. No respiratory distress. He has no wheezes.  Abdominal: Soft. Bowel sounds are normal. He exhibits no distension. There is no tenderness.  Lymphadenopathy: no cervical adenopathy.  Skin: erythema to chest wall Ext: +1-2 edema on legs,arms c/w anasarca     Lab Results  Recent Labs  08/01/13 0340  08/01/13 1420 08/02/13 0500  WBC 33.6*  --   --  30.0*  HGB 12.8*  --   --  12.5*  HCT 34.8*  --   --  34.1*  NA  --   < > 131* 133*  K  --   < > 3.4* 3.0*  CL  --   < > 90* 90*  CO2  --   < > 25 26  BUN  --   < > 88* 88*  CREATININE  --   < >  5.67* 5.47*  < > = values in this interval not displayed. Liver Panel  Recent Labs  07/31/13 0425  PROT 5.1*  ALBUMIN 1.4*  AST 69*  ALT 39  ALKPHOS 87  BILITOT 2.2*    Microbiology: 12/29  Blood cx x 2: Group A strep Studies/Results: Ct Abdomen Pelvis Wo Contrast  07/31/2013   CLINICAL DATA:  Evaluate for gas in the right flank.  EXAM: CT CHEST, ABDOMEN AND PELVIS WITHOUT CONTRAST  TECHNIQUE: Multidetector CT imaging of the chest, abdomen and pelvis was performed following the standard protocol without IV contrast.  COMPARISON:  No priors.  FINDINGS: CT CHEST FINDINGS  Mediastinum: Left internal jugular central venous catheter with tip terminating in the mid superior vena cava. Endotracheal tube with tip in the upper trachea shortly above the level of the aortic arch. Nasogastric tube in the body of the stomach. Heart size is borderline enlarged. Trace amount of pericardial fluid or thickening, unlikely to be of  hemodynamic significance at this time. No associated pericardial calcification. No pathologically enlarged mediastinal or hilar lymph nodes. Please note that accurate exclusion of hilar adenopathy is limited on noncontrast CT scans. Esophagus is unremarkable in appearance.  Lungs/Pleura: Small bilateral pleural effusions (right greater than left) layering dependently with some associated passive atelectasis throughout the lower lobes of the lungs bilaterally. Some superimposed airspace consolidation in the lower lobes of the lungs bilaterally is also suspected, potentially related to mild aspiration. Several foci of peribronchovascular ground-glass attenuation are also noted in the right middle lobe, which could represent some endobronchial spread of infection/inflammation. No other suspicious appearing pulmonary nodules or masses are noted. No pneumothorax.  Musculoskeletal: There are no aggressive appearing lytic or blastic lesions noted in the visualized portions of the skeleton.  CT ABDOMEN AND PELVIS FINDINGS  Abdomen/Pelvis: Multiple nonobstructive calculi are noted within the collecting systems of the kidneys bilaterally, largest of which is in the lower pole collecting system of the left kidney measuring 1 cm. No ureteral stones or findings of urinary tract obstruction are noted at this time. The unenhanced appearance of the kidneys is otherwise unremarkable bilaterally. Several sub cm low-attenuation hepatic lesions are noted, but are incompletely characterized on today's non contrast CT examination. Diffuse decreased attenuation throughout the hepatic parenchyma, indicative of hepatic steatosis. The unenhanced appearance of the gallbladder, pancreas, spleen and bilateral adrenal glands is unremarkable.  Trace volume of free fluid in the pelvis. No larger volume of ascites. No pneumoperitoneum. No pathologic distention of small bowel. No definite lymphadenopathy identified within the abdomen or pelvis on  today's non contrast CT examination. Prostate gland and urinary bladder are unremarkable in appearance. A Foley balloon catheter is present with tip in the lumen of the urinary bladder.  Musculoskeletal: Diffuse body wall edema. No definite gas identified within the soft tissues of the right flank. There are no aggressive appearing lytic or blastic lesions noted in the visualized portions of the skeleton.  IMPRESSION: 1. No subcutaneous or intramuscular gas identified within the soft tissues of the right flank. 2. Diffuse body wall edema with small bilateral pleural effusions, trace ascites and trace amount of pericardial fluid, likely to reflect a state of anasarca. 3. In addition to the areas of passive atelectasis in the lower lobes of the lungs bilaterally there is likely some superimposed airspace consolidation, as well as small foci of airspace disease in the right middle lobe, concerning for sequela of aspiration. 4. Hepatic steatosis. 5. Nonobstructive calculi within the collecting systems of the  kidneys bilaterally, largest of which measures 1 cm in the lower pole of the left kidney. No ureteral stones or findings of urinary tract obstruction at this time. 6. Support apparatus, as above.   Electronically Signed   By: Trudie Reedaniel  Entrikin M.D.   On: 07/31/2013 23:08   Ct Chest Wo Contrast  07/31/2013   CLINICAL DATA:  Evaluate for gas in the right flank.  EXAM: CT CHEST, ABDOMEN AND PELVIS WITHOUT CONTRAST  TECHNIQUE: Multidetector CT imaging of the chest, abdomen and pelvis was performed following the standard protocol without IV contrast.  COMPARISON:  No priors.  FINDINGS: CT CHEST FINDINGS  Mediastinum: Left internal jugular central venous catheter with tip terminating in the mid superior vena cava. Endotracheal tube with tip in the upper trachea shortly above the level of the aortic arch. Nasogastric tube in the body of the stomach. Heart size is borderline enlarged. Trace amount of pericardial fluid or  thickening, unlikely to be of hemodynamic significance at this time. No associated pericardial calcification. No pathologically enlarged mediastinal or hilar lymph nodes. Please note that accurate exclusion of hilar adenopathy is limited on noncontrast CT scans. Esophagus is unremarkable in appearance.  Lungs/Pleura: Small bilateral pleural effusions (right greater than left) layering dependently with some associated passive atelectasis throughout the lower lobes of the lungs bilaterally. Some superimposed airspace consolidation in the lower lobes of the lungs bilaterally is also suspected, potentially related to mild aspiration. Several foci of peribronchovascular ground-glass attenuation are also noted in the right middle lobe, which could represent some endobronchial spread of infection/inflammation. No other suspicious appearing pulmonary nodules or masses are noted. No pneumothorax.  Musculoskeletal: There are no aggressive appearing lytic or blastic lesions noted in the visualized portions of the skeleton.  CT ABDOMEN AND PELVIS FINDINGS  Abdomen/Pelvis: Multiple nonobstructive calculi are noted within the collecting systems of the kidneys bilaterally, largest of which is in the lower pole collecting system of the left kidney measuring 1 cm. No ureteral stones or findings of urinary tract obstruction are noted at this time. The unenhanced appearance of the kidneys is otherwise unremarkable bilaterally. Several sub cm low-attenuation hepatic lesions are noted, but are incompletely characterized on today's non contrast CT examination. Diffuse decreased attenuation throughout the hepatic parenchyma, indicative of hepatic steatosis. The unenhanced appearance of the gallbladder, pancreas, spleen and bilateral adrenal glands is unremarkable.  Trace volume of free fluid in the pelvis. No larger volume of ascites. No pneumoperitoneum. No pathologic distention of small bowel. No definite lymphadenopathy identified  within the abdomen or pelvis on today's non contrast CT examination. Prostate gland and urinary bladder are unremarkable in appearance. A Foley balloon catheter is present with tip in the lumen of the urinary bladder.  Musculoskeletal: Diffuse body wall edema. No definite gas identified within the soft tissues of the right flank. There are no aggressive appearing lytic or blastic lesions noted in the visualized portions of the skeleton.  IMPRESSION: 1. No subcutaneous or intramuscular gas identified within the soft tissues of the right flank. 2. Diffuse body wall edema with small bilateral pleural effusions, trace ascites and trace amount of pericardial fluid, likely to reflect a state of anasarca. 3. In addition to the areas of passive atelectasis in the lower lobes of the lungs bilaterally there is likely some superimposed airspace consolidation, as well as small foci of airspace disease in the right middle lobe, concerning for sequela of aspiration. 4. Hepatic steatosis. 5. Nonobstructive calculi within the collecting systems of the kidneys bilaterally,  largest of which measures 1 cm in the lower pole of the left kidney. No ureteral stones or findings of urinary tract obstruction at this time. 6. Support apparatus, as above.   Electronically Signed   By: Trudie Reed M.D.   On: 07/31/2013 23:08   Dg Chest Port 1 View  08/02/2013   CLINICAL DATA:  Evaluate for infiltrates  EXAM: PORTABLE CHEST - 1 VIEW  COMPARISON:  July 31, 2013  FINDINGS: Endotracheal tube ends at the thoracic inlet, projecting at the level of the clavicular heads. Left IJ catheter in unchanged position. Enteric tube crosses the diaphragm.  Unchanged cardiopericardial enlargement. Haziness of the bilateral chest is relatively similar given differences in projection, pleural fluid and atelectasis based on previous CT. In the lingular region, the lung is better aerated.  IMPRESSION: 1. Good positioning of tubes and lines. 2. Layering  pleural effusions, no indication of interval increase. 3. Improved left lung aeration in this patient with atelectasis and consolidation by recent CT.   Electronically Signed   By: Tiburcio Pea M.D.   On: 08/02/2013 06:12   Portable Chest Xray  07/31/2013   CLINICAL DATA:  Intubation.  EXAM: PORTABLE CHEST - 1 VIEW  COMPARISON:  Earlier the same date.  FINDINGS: 1418 hr. The endotracheal tube is well positioned in the midtrachea. A nasogastric tube projects below the diaphragm. Left IJ central venous catheter is unchanged at the level of the mid SVC. There is persistent pulmonary edema with increased opacity at the left lung base, likely due to a combination of pleural fluid and basilar airspace disease. No pneumothorax is evident. The heart size and mediastinal contours are stable.  IMPRESSION: Satisfactory position of the newly placed endotracheal and nasogastric tubes. Increased left basilar airspace disease and probable adjacent pleural fluid.   Electronically Signed   By: Roxy Horseman M.D.   On: 07/31/2013 14:34   Dg Chest Port 1 View  07/31/2013   CLINICAL DATA:  Shortness of breath  EXAM: PORTABLE CHEST - 1 VIEW  COMPARISON:  07/31/2013  FINDINGS: Left-sided jugular central venous catheter in unchanged position. Bilateral interstitial and alveolar airspace opacities with bilateral central pulmonary vascular prominence. No pleural effusions or pneumothorax. Stable cardiomediastinal silhouette. Unremarkable osseous structures.  IMPRESSION: Bilateral interstitial and alveolar airspace opacities as can be seen with an infectious or inflammatory process versus pulmonary edema.   Electronically Signed   By: Elige Ko   On: 07/31/2013 11:38     Assessment/Plan: Group A strep myositis/celllulitis and bacteremia presenting with toxic shock = clinically much improved over the last 24hr since being off of pressors, and extubated. physical exam shows on going bullae at on mid chest wall of anterior  axillary line, right flank and right ant hip, unchanged since yesterday. can continue with piptazo (concern for aspiration pna per pccm) and clindamycin.  Keep clinda for toxin inhibition, appears no worsening presently. No need to check procalcitonin is trending down, and clinically appears improving.  Leukocytosis =  Continue with daily cbc. Leukocytosis peaked, appears trending down. Likely in part from infection and stress from MI 2/2 septic shock ,improved today  AKI= appears slightly better. Renal is holding diuretics to see if patient has good UOP and will be followed closely for post ATN diuresis  Sepsis management = continues on abtx for group A strep. Will narrow back to PCN if he continues to improve.  STEMI = resolved on telemetry, no Q wave on ECG. Etiology likely due to demand ischemia  from septic shock. Per cardiology recs, heparin drip to continue today.   Drue Second [facility] Cell: [phone] Pager: [phone]  08/02/2013, 11:18 AM

## 2013-08-02 NOTE — Progress Notes (Signed)
ANTIBIOTIC CONSULT NOTE - FOLLOW UP  Pharmacy Consult for Zosyn Indication: cellulitis  No Known Allergies  Patient Measurements: Height: 5\' 11"  (180.3 cm) Weight:  (unable to weight pt due to bed) IBW/kg (Calculated) : 75.3  Vital Signs: Temp: 98.4 F (36.9 C) (01/03 0800) Temp src: Axillary (01/03 0800) BP: 134/64 mmHg (01/03 0824) Pulse Rate: 90 (01/03 1000) Intake/Output from previous Rail: 01/02 0701 - 01/03 0700 In: 2540.5 [I.V.:1439.2; NG/GT:601.3; IV Piggyback:500] Out: [id number] [Urine:7905; Emesis/NG output:70] Intake/Output from this shift: Total I/O In: 159 [I.V.:159] Out: 1800 [Urine:1800]  Labs:  Recent Labs  07/31/13 0425 08/01/13 0340 08/01/13 0440 08/01/13 1420 08/02/13 0500  WBC 25.6* 33.6*  --   --  30.0*  HGB 12.0* 12.8*  --   --  12.5*  PLT 108* 128*  --   --  124*  CREATININE 5.89*  --  5.74* 5.67* 5.47*   Estimated Creatinine Clearance: 21.8 ml/min (by C-G formula based on Cr of 5.47).  Recent Labs  08/01/13 1300  VANCORANDOM 8.2     Microbiology: Recent Results (from the past 720 hour(s))  CULTURE, BLOOD (ROUTINE X 2)     Status: None   Collection Time    07/28/13  7:05 PM      Result Value Range Status   Specimen Description BLOOD RIGHT ANTECUBITAL   Final   Special Requests NONE BOTTLES DRAWN AEROBIC AND ANAEROBIC [facility]5CC EACH   Final   Culture  Setup Time     Final   Value: 07/29/2013 01:25     Performed at [facility]   Culture     Final   Value: GROUP A STREP (S.PYOGENES) ISOLATED     Note: Gram Stain Report Called to,Read Back By and Verified With: Charlsie MerlesERESA O LEARY 07/29/13 1405 BY SMITHERSJ     Performed at [facility]   Report Status PENDING   Incomplete  CULTURE, BLOOD (ROUTINE X 2)     Status: None   Collection Time    07/28/13  7:25 PM      Result Value Range Status   Specimen Description BLOOD LEFT ANTECUBITAL   Final   Special Requests NONE BOTTLES DRAWN AEROBIC AND ANAEROBIC 10CC EACH   Final   Culture   Setup Time     Final   Value: 07/29/2013 01:25     Performed at [facility]   Culture     Final   Value: GROUP A STREP (S.PYOGENES) ISOLATED     Note: Gram Stain Report Called to,Read Back By and Verified With: Charlsie MerlesERESA O LEARY 07/29/13 1405 BY SMITHERSJ     Performed at [facility]   Report Status PENDING   Incomplete  URINE CULTURE     Status: None   Collection Time    07/29/13 12:43 AM      Result Value Range Status   Specimen Description URINE, RANDOM   Final   Special Requests NONE   Final   Culture  Setup Time     Final   Value: 07/29/2013 08:59     Performed at [facility]   Colony Count     Final   Value: NO GROWTH     Performed at [facility]   Culture     Final   Value: NO GROWTH     Performed at [facility]   Report Status 07/30/2013 FINAL   Final    Anti-infectives   Start     Dose/Rate  Route Frequency Ordered Stop   08/01/13 1300  piperacillin-tazobactam (ZOSYN) IVPB 2.25 g     2.25 g 100 mL/hr over 30 Minutes Intravenous 4 times per Gaffin 08/01/13 1123     07/31/13 0600  vancomycin (VANCOCIN) IVPB 1000 mg/200 mL premix  Status:  Discontinued     1,000 mg 200 mL/hr over 60 Minutes Intravenous Every 48 hours 07/29/13 0305 07/30/13 1109   07/30/13 2200  penicillin G potassium 4 Million Units in dextrose 5 % 250 mL IVPB  Status:  Discontinued     4 Million Units 250 mL/hr over 60 Minutes Intravenous 3 times per Siegert 07/30/13 1258 08/01/13 0921   07/30/13 1400  clindamycin (CLEOCIN) IVPB 600 mg    Comments:  Pharmacy may adjust antiboitics as needed; GAS, ? necrotizing   600 mg 100 mL/hr over 30 Minutes Intravenous 3 times per Sunde 07/30/13 1109     07/30/13 1200  penicillin G potassium 4 Million Units in dextrose 5 % 250 mL IVPB    Comments:  Group A strep   4 Million Units 250 mL/hr over 60 Minutes Intravenous 6 times per Age 07/30/13 1109 07/30/13 1516   07/29/13 0400  vancomycin (VANCOCIN) IVPB 1000 mg/200 mL premix      1,000 mg 200 mL/hr over 60 Minutes Intravenous  Once 07/29/13 0305 07/29/13 0752   07/29/13 0400  piperacillin-tazobactam (ZOSYN) IVPB 3.375 g  Status:  Discontinued     3.375 g 12.5 mL/hr over 240 Minutes Intravenous 3 times per Becka 07/29/13 0305 07/30/13 1109   07/28/13 1945  vancomycin (VANCOCIN) IVPB 1000 mg/200 mL premix     1,000 mg 200 mL/hr over 60 Minutes Intravenous  Once 07/28/13 1934 07/28/13 2105   07/28/13 1945  piperacillin-tazobactam (ZOSYN) IVPB 3.375 g     3.375 g 12.5 mL/hr over 240 Minutes Intravenous  Once 07/28/13 1934 07/28/13 2033      Assessment: 43 yo M continuing on Zosyn for Group A strep Bacteremia and cellulitis/myositis concerning for necrotizing fasciitis, Infectious Disease following. Also noted concerns for aspiration PNA as well.  Patient also receiving clindamycin for toxin inhibition.   He is in AKI with SCr down to 5.47 with estimated CrCl~22.  Patient is afebrile and WBC is still elevated at 30.   Goal of Therapy:  Resolution of Infection  Plan:  - continue Zosyn IV at 2.25gm q6h - f/u changes in renal function tomorrow (borderline to change to higher dose with extended interval) - f/u temperature curve, WBC, and clinical progression  Harrold Donath E. Achilles Dunk, PharmD Clinical Pharmacist - Resident Pager: [phone] Pharmacy: [phone] 08/02/2013 1:21 PM

## 2013-08-02 NOTE — Progress Notes (Signed)
ANTICOAGULATION CONSULT NOTE - Follow Up Consult  Pharmacy Consult for heparin Indication: r/o ACS  Labs:  Recent Labs  07/30/13 0446 07/31/13 0425 07/31/13 0915  07/31/13 1625  08/01/13 0300 08/01/13 0340 08/01/13 0440 08/01/13 1300 08/01/13 1420 08/02/13 0029  HGB 12.8* 12.0*  --   --   --   --   --  12.8*  --   --   --   --   HCT 35.0* 32.9*  --   --   --   --   --  34.8*  --   --   --   --   PLT 136* 108*  --   --   --   --   --  128*  --   --   --   --   LABPROT  --   --  15.1  --   --   --   --   --   --   --   --   --   INR  --   --  1.22  --   --   --   --   --   --   --   --   --   HEPARINUNFRC  --   --   --   < >  --   < > <0.10*  --   --  0.14*  --  <0.10*  CREATININE 5.15* 5.89*  --   --   --   --   --   --  5.74*  --  5.67*  --   CKTOTAL 1565* 903*  --   --   --   --   --   --   --   --   --   --   TROPONINI  --  9.33* >20.00*  --  >20.00*  --   --   --   --   --   --   --   < > = values in this interval not displayed.   Assessment: 43yo male now undetectable on heparin despite rate increase with no gtt issues per RN; cards notes that troponin likely d/t demand ischemia 2/2 sepsis.  Goal of Therapy:  Heparin level 0.3-0.7 units/ml   Plan:  Will rebolus with heparin 4000 units x1 and increase gtt by 4 units/kg/hr to 2800 units/hr and check level in 8hr.  Vernard GamblesVeronda Decklan Mau, PharmD, BCPS  08/02/2013,3:18 AM

## 2013-08-02 NOTE — Progress Notes (Signed)
ANTICOAGULATION CONSULT NOTE - Follow- up Consult  Pharmacy Consult for Heparin Indication: chest pain/ACS  No Known Allergies  Patient Measurements: Height: 5\' 11"  (180.3 cm) Weight:  (unable to weight pt due to bed) IBW/kg (Calculated) : 75.3 Heparin Dosing Weight: 100 kg  Vital Signs: Temp: 98.4 F (36.9 C) (01/03 0800) Temp src: Axillary (01/03 0800) BP: 134/64 mmHg (01/03 0824) Pulse Rate: 90 (01/03 1000)  Labs:  Recent Labs  07/31/13 0425 07/31/13 0915  07/31/13 1625  08/01/13 0340 08/01/13 0440 08/01/13 1300 08/01/13 1420 08/02/13 0029 08/02/13 0500 08/02/13 1100  HGB 12.0*  --   --   --   --  12.8*  --   --   --   --  12.5*  --   HCT 32.9*  --   --   --   --  34.8*  --   --   --   --  34.1*  --   PLT 108*  --   --   --   --  128*  --   --   --   --  124*  --   LABPROT  --  15.1  --   --   --   --   --   --   --   --   --   --   INR  --  1.22  --   --   --   --   --   --   --   --   --   --   HEPARINUNFRC  --   --   < >  --   < >  --   --  0.14*  --  <0.10*  --  0.61  CREATININE 5.89*  --   --   --   --   --  5.74*  --  5.67*  --  5.47*  --   CKTOTAL 903*  --   --   --   --   --   --   --   --   --   --   --   TROPONINI 9.33* >20.00*  --  >20.00*  --   --   --   --   --   --   --   --   < > = values in this interval not displayed.  Estimated Creatinine Clearance: 21.8 ml/min (by C-G formula based on Cr of 5.47).  Medications:  Scheduled:  . antiseptic oral rinse  15 mL Mouth Rinse QID  . aspirin  81 mg Oral Daily  . atorvastatin  20 mg Oral q1800  . chlorhexidine  15 mL Mouth/Throat BID  . clindamycin (CLEOCIN) IV  600 mg Intravenous Q8H  . docusate  100 mg Per Tube BID  . feeding supplement (PRO-STAT SUGAR FREE 64)  30 mL Per Tube BID  . insulin aspart  0-15 Units Subcutaneous Q4H  . lidocaine  2 patch Transdermal Q24H  . pantoprazole (PROTONIX) IV  40 mg Intravenous Daily  . piperacillin-tazobactam (ZOSYN)  IV  2.25 g Intravenous Q6H  .  polyethylene glycol  17 g Oral BID  . potassium chloride  40 mEq Per Tube BID  . sodium chloride  3 mL Intravenous Q12H    Assessment: 43 yo M with chest pain/EKG changes. Pharmacy consulted to dose heparin gtt.  After five subtherapeutic heparin levels with subsequent reboluses and rate increases, heparin level is now therapeutic at 0.61 on a rate of 2800  units/hr.  H/H remains low but stable, and plt are low but stable.  No bleeding issues have been reported.  Per cardiology note, possible plans to discontinue heparin gtt after today.  Per surgery note, no plans for surgical intervention for cellulitis at this time, only antibiotics.  Goal of Therapy:  Heparin level 0.3-0.7 units/ml Monitor platelets by anticoagulation protocol: Yes   Plan:  - continue heparin IV gtt at 2800 units/hr - recheck 8h HL - daily HL and CBC - monitor for s/s of bleeding - f/u possible plans to stop heparin gtt after today per cardiology note  Shelba Flake. Achilles Dunk, PharmD Clinical Pharmacist - Resident Pager: [phone] Pharmacy: [phone] 08/02/2013 12:52 PM

## 2013-08-02 NOTE — Progress Notes (Signed)
Assessment:  1 Non-Oliguric AKI patient, recovery phase (baseline Cr unknown), due to septic shock  2 Septic shock, due to Group A strep bacteremia and cellulitis/myositis.  3 Metabolic Acidosis improving  4 hypokalemia, hyponatremia, hypoalbuminemia  5 Elevated troponin and transient EKG changes   REC:  1 Hold Diuretics & see if in diuretic phase of  AKI 2 K replace    Subjective: Interval History: 7.9 liters UOP!  Objective: Vital signs in last 24 hours: Temp:  [99 F (37.2 C)-99.3 F (37.4 C)] 99 F (37.2 C) (01/03 0400) Pulse Rate:  [89-118] 91 (01/03 0824) Resp:  [15-34] 28 (01/03 0824) BP: (129-137)/(64-74) 134/64 mmHg (01/03 0824) SpO2:  [98 %-100 %] 100 % (01/03 0824) Arterial Line BP: (111-148)/(64-84) 143/68 mmHg (01/03 0600) FiO2 (%):  [40 %-50 %] 40 % (01/03 0824) Weight change:   Intake/Output from previous [place]: 01/02 0701 - 01/03 0700 In: 2487.5 [I.V.:1386.2; NG/GT:601.3; IV Piggyback:500] Out: 7975 [Urine:7905; Emesis/NG output:70] Intake/Output this shift:    General appearance: alert and cooperative Lungs clear Chest wall less tender on right Right thigh edematous with less tenderness  Lab Results:  Recent Labs  08/01/13 0340 08/02/13 0500  WBC 33.6* 30.0*  HGB 12.8* 12.5*  HCT 34.8* 34.1*  PLT 128* 124*   BMET:  Recent Labs  08/01/13 1420 08/02/13 0500  NA 131* 133*  K 3.4* 3.0*  CL 90* 90*  CO2 25 26  GLUCOSE 112* 132*  BUN 88* 88*  CREATININE 5.67* 5.47*  CALCIUM 7.0* 7.3*   No results found for this basename: PTH,  in the last 72 hours Iron Studies: No results found for this basename: IRON, TIBC, TRANSFERRIN, FERRITIN,  in the last 72 hours Studies/Results: Ct Abdomen Pelvis Wo Contrast  07/31/2013   CLINICAL DATA:  Evaluate for gas in the right flank.  EXAM: CT CHEST, ABDOMEN AND PELVIS WITHOUT CONTRAST  TECHNIQUE: Multidetector CT imaging of the chest, abdomen and pelvis was performed following the standard protocol without  IV contrast.  COMPARISON:  No priors.  FINDINGS: CT CHEST FINDINGS  Mediastinum: Left internal jugular central venous catheter with tip terminating in the mid superior vena cava. Endotracheal tube with tip in the upper trachea shortly above the level of the aortic arch. Nasogastric tube in the body of the stomach. Heart size is borderline enlarged. Trace amount of pericardial fluid or thickening, unlikely to be of hemodynamic significance at this time. No associated pericardial calcification. No pathologically enlarged mediastinal or hilar lymph nodes. Please note that accurate exclusion of hilar adenopathy is limited on noncontrast CT scans. Esophagus is unremarkable in appearance.  Lungs/Pleura: Small bilateral pleural effusions (right greater than left) layering dependently with some associated passive atelectasis throughout the lower lobes of the lungs bilaterally. Some superimposed airspace consolidation in the lower lobes of the lungs bilaterally is also suspected, potentially related to mild aspiration. Several foci of peribronchovascular ground-glass attenuation are also noted in the right middle lobe, which could represent some endobronchial spread of infection/inflammation. No other suspicious appearing pulmonary nodules or masses are noted. No pneumothorax.  Musculoskeletal: There are no aggressive appearing lytic or blastic lesions noted in the visualized portions of the skeleton.  CT ABDOMEN AND PELVIS FINDINGS  Abdomen/Pelvis: Multiple nonobstructive calculi are noted within the collecting systems of the kidneys bilaterally, largest of which is in the lower pole collecting system of the left kidney measuring 1 cm. No ureteral stones or findings of urinary tract obstruction are noted at this time. The unenhanced appearance  of the kidneys is otherwise unremarkable bilaterally. Several sub cm low-attenuation hepatic lesions are noted, but are incompletely characterized on today's non contrast CT  examination. Diffuse decreased attenuation throughout the hepatic parenchyma, indicative of hepatic steatosis. The unenhanced appearance of the gallbladder, pancreas, spleen and bilateral adrenal glands is unremarkable.  Trace volume of free fluid in the pelvis. No larger volume of ascites. No pneumoperitoneum. No pathologic distention of small bowel. No definite lymphadenopathy identified within the abdomen or pelvis on today's non contrast CT examination. Prostate gland and urinary bladder are unremarkable in appearance. A Foley balloon catheter is present with tip in the lumen of the urinary bladder.  Musculoskeletal: Diffuse body wall edema. No definite gas identified within the soft tissues of the right flank. There are no aggressive appearing lytic or blastic lesions noted in the visualized portions of the skeleton.  IMPRESSION: 1. No subcutaneous or intramuscular gas identified within the soft tissues of the right flank. 2. Diffuse body wall edema with small bilateral pleural effusions, trace ascites and trace amount of pericardial fluid, likely to reflect a state of anasarca. 3. In addition to the areas of passive atelectasis in the lower lobes of the lungs bilaterally there is likely some superimposed airspace consolidation, as well as small foci of airspace disease in the right middle lobe, concerning for sequela of aspiration. 4. Hepatic steatosis. 5. Nonobstructive calculi within the collecting systems of the kidneys bilaterally, largest of which measures 1 cm in the lower pole of the left kidney. No ureteral stones or findings of urinary tract obstruction at this time. 6. Support apparatus, as above.   Electronically Signed   By: Trudie Reed M.D.   On: 07/31/2013 23:08   Ct Chest Wo Contrast  07/31/2013   CLINICAL DATA:  Evaluate for gas in the right flank.  EXAM: CT CHEST, ABDOMEN AND PELVIS WITHOUT CONTRAST  TECHNIQUE: Multidetector CT imaging of the chest, abdomen and pelvis was performed  following the standard protocol without IV contrast.  COMPARISON:  No priors.  FINDINGS: CT CHEST FINDINGS  Mediastinum: Left internal jugular central venous catheter with tip terminating in the mid superior vena cava. Endotracheal tube with tip in the upper trachea shortly above the level of the aortic arch. Nasogastric tube in the body of the stomach. Heart size is borderline enlarged. Trace amount of pericardial fluid or thickening, unlikely to be of hemodynamic significance at this time. No associated pericardial calcification. No pathologically enlarged mediastinal or hilar lymph nodes. Please note that accurate exclusion of hilar adenopathy is limited on noncontrast CT scans. Esophagus is unremarkable in appearance.  Lungs/Pleura: Small bilateral pleural effusions (right greater than left) layering dependently with some associated passive atelectasis throughout the lower lobes of the lungs bilaterally. Some superimposed airspace consolidation in the lower lobes of the lungs bilaterally is also suspected, potentially related to mild aspiration. Several foci of peribronchovascular ground-glass attenuation are also noted in the right middle lobe, which could represent some endobronchial spread of infection/inflammation. No other suspicious appearing pulmonary nodules or masses are noted. No pneumothorax.  Musculoskeletal: There are no aggressive appearing lytic or blastic lesions noted in the visualized portions of the skeleton.  CT ABDOMEN AND PELVIS FINDINGS  Abdomen/Pelvis: Multiple nonobstructive calculi are noted within the collecting systems of the kidneys bilaterally, largest of which is in the lower pole collecting system of the left kidney measuring 1 cm. No ureteral stones or findings of urinary tract obstruction are noted at this time. The unenhanced appearance of the  kidneys is otherwise unremarkable bilaterally. Several sub cm low-attenuation hepatic lesions are noted, but are incompletely  characterized on today's non contrast CT examination. Diffuse decreased attenuation throughout the hepatic parenchyma, indicative of hepatic steatosis. The unenhanced appearance of the gallbladder, pancreas, spleen and bilateral adrenal glands is unremarkable.  Trace volume of free fluid in the pelvis. No larger volume of ascites. No pneumoperitoneum. No pathologic distention of small bowel. No definite lymphadenopathy identified within the abdomen or pelvis on today's non contrast CT examination. Prostate gland and urinary bladder are unremarkable in appearance. A Foley balloon catheter is present with tip in the lumen of the urinary bladder.  Musculoskeletal: Diffuse body wall edema. No definite gas identified within the soft tissues of the right flank. There are no aggressive appearing lytic or blastic lesions noted in the visualized portions of the skeleton.  IMPRESSION: 1. No subcutaneous or intramuscular gas identified within the soft tissues of the right flank. 2. Diffuse body wall edema with small bilateral pleural effusions, trace ascites and trace amount of pericardial fluid, likely to reflect a state of anasarca. 3. In addition to the areas of passive atelectasis in the lower lobes of the lungs bilaterally there is likely some superimposed airspace consolidation, as well as small foci of airspace disease in the right middle lobe, concerning for sequela of aspiration. 4. Hepatic steatosis. 5. Nonobstructive calculi within the collecting systems of the kidneys bilaterally, largest of which measures 1 cm in the lower pole of the left kidney. No ureteral stones or findings of urinary tract obstruction at this time. 6. Support apparatus, as above.   Electronically Signed   By: Trudie Reed M.D.   On: 07/31/2013 23:08   Dg Chest Port 1 View  08/02/2013   CLINICAL DATA:  Evaluate for infiltrates  EXAM: PORTABLE CHEST - 1 VIEW  COMPARISON:  July 31, 2013  FINDINGS: Endotracheal tube ends at the thoracic  inlet, projecting at the level of the clavicular heads. Left IJ catheter in unchanged position. Enteric tube crosses the diaphragm.  Unchanged cardiopericardial enlargement. Haziness of the bilateral chest is relatively similar given differences in projection, pleural fluid and atelectasis based on previous CT. In the lingular region, the lung is better aerated.  IMPRESSION: 1. Good positioning of tubes and lines. 2. Layering pleural effusions, no indication of interval increase. 3. Improved left lung aeration in this patient with atelectasis and consolidation by recent CT.   Electronically Signed   By: Tiburcio Pea M.D.   On: 08/02/2013 06:12   Portable Chest Xray  07/31/2013   CLINICAL DATA:  Intubation.  EXAM: PORTABLE CHEST - 1 VIEW  COMPARISON:  Earlier the same date.  FINDINGS: 1418 hr. The endotracheal tube is well positioned in the midtrachea. A nasogastric tube projects below the diaphragm. Left IJ central venous catheter is unchanged at the level of the mid SVC. There is persistent pulmonary edema with increased opacity at the left lung base, likely due to a combination of pleural fluid and basilar airspace disease. No pneumothorax is evident. The heart size and mediastinal contours are stable.  IMPRESSION: Satisfactory position of the newly placed endotracheal and nasogastric tubes. Increased left basilar airspace disease and probable adjacent pleural fluid.   Electronically Signed   By: Roxy Horseman M.D.   On: 07/31/2013 14:34   Dg Chest Port 1 View  07/31/2013   CLINICAL DATA:  Shortness of breath  EXAM: PORTABLE CHEST - 1 VIEW  COMPARISON:  07/31/2013  FINDINGS: Left-sided jugular central  venous catheter in unchanged position. Bilateral interstitial and alveolar airspace opacities with bilateral central pulmonary vascular prominence. No pleural effusions or pneumothorax. Stable cardiomediastinal silhouette. Unremarkable osseous structures.  IMPRESSION: Bilateral interstitial and alveolar  airspace opacities as can be seen with an infectious or inflammatory process versus pulmonary edema.   Electronically Signed   By: Elige Ko   On: 07/31/2013 11:38   Scheduled: . antiseptic oral rinse  15 mL Mouth Rinse QID  . aspirin  81 mg Oral Daily  . atorvastatin  20 mg Oral q1800  . chlorhexidine  15 mL Mouth/Throat BID  . clindamycin (CLEOCIN) IV  600 mg Intravenous Q8H  . docusate sodium  100 mg Oral BID  . feeding supplement (PRO-STAT SUGAR FREE 64)  30 mL Per Tube BID  . furosemide  80 mg Intravenous Q6H  . insulin aspart  0-15 Units Subcutaneous Q4H  . lidocaine  2 patch Transdermal Q24H  . pantoprazole (PROTONIX) IV  40 mg Intravenous Daily  . piperacillin-tazobactam (ZOSYN)  IV  2.25 g Intravenous Q6H  . polyethylene glycol  17 g Oral BID  . potassium chloride  40 mEq Per Tube BID  . potassium chloride  40 mEq Per Tube Once  . sodium chloride  3 mL Intravenous Q12H     LOS: 5 days   Jeanie Mccard C 08/02/2013,8:32 AM

## 2013-08-02 NOTE — Progress Notes (Signed)
08/02/2013- Resp Care Note- Pt suctioned and extubated at 1325 to 4lpm cannula.  Pt tolerated procedure well with pt vocalizing post extubation.  Pt sats 96% on 4lpm cannula, resp rate 14, and heart rate106.   Ventilator on stand-by at bedside.  Will monitor progress and wean oxygen as tolerated.  S Keeshia Sanderlin rrt, rcp

## 2013-08-02 NOTE — Progress Notes (Signed)
PULMONARY / CRITICAL CARE MEDICINE  Name: Andrew Hogan MRN: [id number] DOB: 03/20/1971    ADMISSION DATE:  07/28/2013 CONSULTATION DATE:  07/31/13  REFERRING MD :  [facility] PRIMARY SERVICE: PCCM  CHIEF COMPLAINT:  Abdominal pain  BRIEF PATIENT DESCRIPTION: 43 y/o male with no past medical history was admitted on 12/29 with myositis and cellulitis of his abdominal wall on 12/29.  He developed hypotension, renal failure, and an STEMI on 1/1 in the early AM, PCCM consulted.  SIGNIFICANT EVENTS / STUDIES:  12/30 - CT chest/abdomen/pelvis >>> Extensive soft tissue and intramuscular cellulitis/infiltration and edema from left shoulder to pelvis, no discrete abscess identified 12/30 - TTE >>> EF 60-65%, grade 1 diastolic dysfunction 12/31 - Renal U/S > Normal size kidneys, no hydro, 6mm stone left lower pole 01/01 - TTE >>> EF 55-60%, no RWMA PAP 42 torr 01/01 - CT chest/abdomen/pelvis >>> NO soft tissue gas, anasarca, pleural effusions, trace pericardial  effusion, bilateral airspace disease (new RML), hepatic steatosis, bilateral nonobstructive calculi  LINES / TUBES: OETT 1/1 >>> OGT 1/1 >>> L IJ CVL 1/1 >> R rad a-line 1/1 >>> Foley 1/1 >>>  CULTURES: 12/29 Blood >> Group a strep 12/30 Urine >> neg  ANTIBIOTICS: Vancomycin 12/29 x1; 1/2 >>> Zosyn 12/29 >>> 12/30; 1/2 >>> Clindamycin 12/31 >>> Penicillin G 12/31 >>> 1/2  INTERVAL HISTORY:  RN reports no acute events.  Remains on 8 mcg of levo  VITAL SIGNS: Temp:  [97.8 F (36.6 C)-99.3 F (37.4 C)] 99 F (37.2 C) (01/03 0400) Pulse Rate:  [89-118] 94 (01/03 0600) Resp:  [15-34] 28 (01/03 0600) BP: (119-137)/(66-74) 137/74 mmHg (01/02 1619) SpO2:  [98 %-100 %] 100 % (01/03 0600) Arterial Line BP: (111-148)/(64-84) 143/68 mmHg (01/03 0600) FiO2 (%):  [40 %-50 %] 40 % (01/03 0416)  HEMODYNAMICS: CVP:  [10 mmHg-13 mmHg] 10 mmHg  VENTILATOR SETTINGS: Vent Mode:  [-] PRVC FiO2 (%):  [40 %-50 %] 40 % Set Rate:  [28 bmp] 28 bmp Vt  Set:  [450 mL] 450 mL PEEP:  [5 cmH20-10 cmH20] 5 cmH20 Plateau Pressure:  [20 cmH20-23 cmH20] 20 cmH20  INTAKE / OUTPUT: Intake/Output     01/02 0701 - 01/03 0700 01/03 0701 - 01/04 0700   I.V. (mL/kg) 1386.2 (12.6)    NG/GT 601.3    IV Piggyback 500    Total Intake(mL/kg) 2487.5 (22.6)    Urine (mL/kg/hr) 7905 (3)    Emesis/NG output 70 (0)    Total Output 7975     Net -5487.5            PHYSICAL EXAMINATION: Gen: Comfortable, no distress HEENT: PERRL PULM: Bilateral air entry, few rales CV: Regular, no murmurs AB: Soft, bowel sounds present. Abdominal wall edema Ext: 1+ edema bilaterally Derm: Erythematous, tender rash throughout left pectoralis (lateral aspect) extending down to mid thigh on left, blisters  Neuro: Awake, alert, following commands  LABS:  CBC  Recent Labs Lab 07/31/13 0425 08/01/13 0340 08/02/13 0500  WBC 25.6* 33.6* 30.0*  HGB 12.0* 12.8* 12.5*  HCT 32.9* 34.8* 34.1*  PLT 108* 128* 124*   Coag's  Recent Labs Lab 07/28/13 1911 07/29/13 0300 07/31/13 0915  APTT  --  46*  --   INR 1.10 1.25 1.22   BMET  Recent Labs Lab 08/01/13 0440 08/01/13 1420 08/02/13 0500  NA 130* 131* 133*  K 2.9* 3.4* 3.0*  CL 88* 90* 90*  CO2 23 25 26   BUN 85* 88* 88*  CREATININE 5.74* 5.67* 5.47*  GLUCOSE 149* 112* 132*   Electrolytes  Recent Labs Lab 08/01/13 0440 08/01/13 1420 08/02/13 0500  CALCIUM 6.5* 7.0* 7.3*  MG 2.1  --   --    Sepsis Markers  Recent Labs Lab 07/29/13 0520 07/29/13 1215 07/31/13 0425 07/31/13 1100 07/31/13 1700  LATICACIDVEN 3.5*  --   --  1.6 1.5  PROCALCITON  --  54.96 48.97  --   --    ABG  Recent Labs Lab 07/31/13 0915 07/31/13 1617 07/31/13 1851  PHART 7.407 7.245* 7.355  PCO2ART 32.7* 57.3* 43.2  PO2ART 69.4* 131.0* 107.0*   Liver Enzymes  Recent Labs Lab 07/29/13 0300 07/30/13 0446 07/31/13 0425  AST 39* 50* 69*  ALT 31 39 39  ALKPHOS 40 48 87  BILITOT 1.5* 1.7* 2.2*  ALBUMIN 2.0*  1.6* 1.4*   Cardiac Enzymes  Recent Labs Lab 07/28/13 1911 07/31/13 0425 07/31/13 0915 07/31/13 1625  TROPONINI <0.30 9.33* >20.00* >20.00*  PROBNP 500.6*  --   --   --    Glucose  Recent Labs Lab 08/01/13 1222 08/01/13 1608 08/01/13 2005 08/02/13 0026 08/02/13 0030 08/02/13 0508  GLUCAP 114* 122* 119* 39* 119* 123*   CXR: 1/3 - layering effusions without increase, improved L lung aeration  ASSESSMENT / PLAN:  PULMONARY A:  Acute respiratory failure in setting of sepsis / AMI. Acute pulmonary edema. Aspiration pneumonia. P:   Goal pH>7.30, SpO2>92 Continuous mechanical support VAP bundle Daily SBT Trend ABG/CXR  CARDIOVASCULAR A:  Septic / cardiogenic shock. (SvO2 77 and normal LV function favoring septic ). Lactate 1.5 reassuring. STEMI. P:  Cardiology following Levophed for Goal MAP > 65 ASA, Heparin, Lipitor BB/ACEI contraindicated shock    RENAL A:   AKI. Doubt significant rhabdomyolysis as normal transaminases / CK decreasing. Total body fluid overload (stay balance pos 18L). Hypokalemia. Bilateral renal calculi. P:   Goal neg 1-1.5L in 24 hours Renal following Trend BMP Lasix to 80 q6h, CVP 3  1/3 K 40mEq x1   GASTROINTESTINAL A: Nutrition. GI Px. P:   NPO TF per Nutritionist Protonix Bowel regimen  HEMATOLOGIC A:   Thrombocytopenia, likely sepsis related. Anticoagulation for ACS. VTE Px is not indicated. P: Trend CBC Heparin per pharmacy  INFECTIOUS A:   Abdominal wall cellulitis, myositis and group A strep bacteremia. Aspiration pneumonia. P:   ABX as above Cultures as above  ENDOCRINE A:  Hyperglycemia, likely stress related. P:   SSI  NEUROLOGIC A: Pain. Ventilator synchrony. P:   Fentanyl gtt Start Versed PRN  Canary BrimBrandi Ollis, NP-C [facility] Pgr: [phone] or [phone]  Attending:  I have seen and examined the patient with nurse practitioner/resident and agree with the note  above.   Looking great.  SBT now, hopefully extubate.  I have personally obtained history, examined patient, evaluated and interpreted laboratory and imaging results, reviewed medical records, formulated assessment / plan and placed orders.  CRITICAL CARE:  The patient is critically ill with multiple organ systems failure and requires high complexity decision making for assessment and support, frequent evaluation and titration of therapies, application of advanced monitoring technologies and extensive interpretation of multiple databases. Critical Care Time devoted to patient care services described in this note is 35 minutes.    08/02/2013, 7:47 AM  Yolonda KidaMCQUAID, Nathalya Wolanski [place] [facility] Pager: [phone] Cell: [phone] If no response, call [phone]

## 2013-08-02 NOTE — Progress Notes (Signed)
Pt seen examined and agree.  

## 2013-08-02 NOTE — Progress Notes (Signed)
ANTICOAGULATION CONSULT NOTE - Follow Up Consult  Pharmacy Consult for Heparin Indication: chest pain/ACS  No Known Allergies  Patient Measurements: Height: 5\' 11"  (180.3 cm) Weight:  (unable to weight pt due to bed) IBW/kg (Calculated) : 75.3 Heparin Dosing Weight: 100kg  Vital Signs: Temp: 99.9 F (37.7 C) (01/03 1200) Temp src: Oral (01/03 1200) BP: 92/64 mmHg (01/03 1800) Pulse Rate: 88 (01/03 1800)  Labs:  Recent Labs  07/31/13 0425 07/31/13 0915  07/31/13 1625  08/01/13 0340 08/01/13 0440  08/01/13 1420 08/02/13 0029 08/02/13 0500 08/02/13 1100 08/02/13 1836  HGB 12.0*  --   --   --   --  12.8*  --   --   --   --  12.5*  --   --   HCT 32.9*  --   --   --   --  34.8*  --   --   --   --  34.1*  --   --   PLT 108*  --   --   --   --  128*  --   --   --   --  124*  --   --   LABPROT  --  15.1  --   --   --   --   --   --   --   --   --   --   --   INR  --  1.22  --   --   --   --   --   --   --   --   --   --   --   HEPARINUNFRC  --   --   < >  --   < >  --   --   < >  --  <0.10*  --  0.61 0.57  CREATININE 5.89*  --   --   --   --   --  5.74*  --  5.67*  --  5.47*  --   --   CKTOTAL 903*  --   --   --   --   --   --   --   --   --   --   --   --   TROPONINI 9.33* >20.00*  --  >20.00*  --   --   --   --   --   --   --   --   --   < > = values in this interval not displayed.  Estimated Creatinine Clearance: 21.8 ml/min (by C-G formula based on Cr of 5.47).   Medications:  Heparin 2800 units/hr   Assessment: 42yom on heparin for CP/ACS. Heparin level (0.57) remains therapeutic - will continue current rate and follow-up AM heparin level and discontinuation of infusion per Cards.  - H/H and Plts stable - No significant bleeding reported  Goal of Therapy:  Heparin level 0.3-0.7 units/ml Monitor platelets by anticoagulation protocol: Yes   Plan:  1. Continue heparin 2800 units/hr (28 ml/hr) 2. Follow-up AM heparin level and discontinuation of  infusion  Cleon DewDulaney, [place] Robert [phone] 08/02/2013,7:20 PM

## 2013-08-03 ENCOUNTER — Inpatient Hospital Stay (HOSPITAL_COMMUNITY): Payer: BC Managed Care – PPO

## 2013-08-03 LAB — BASIC METABOLIC PANEL
BUN: 93 mg/dL — AB (ref 6–23)
CHLORIDE: 93 meq/L — AB (ref 96–112)
CO2: 28 mEq/L (ref 19–32)
Calcium: 7.6 mg/dL — ABNORMAL LOW (ref 8.4–10.5)
Creatinine, Ser: 5.18 mg/dL — ABNORMAL HIGH (ref 0.50–1.35)
GFR calc Af Amer: 14 mL/min — ABNORMAL LOW (ref >90)
GFR calc non Af Amer: 12 mL/min — ABNORMAL LOW (ref >90)
GLUCOSE: 90 mg/dL (ref 70–99)
POTASSIUM: 3.5 meq/L — AB (ref 3.7–5.3)
Sodium: 136 mEq/L — ABNORMAL LOW (ref 137–147)

## 2013-08-03 LAB — DIFFERENTIAL
Basophils Absolute: 0.6 10*3/uL — ABNORMAL HIGH (ref 0.0–0.1)
Basophils Relative: 2 % — ABNORMAL HIGH (ref 0–1)
Eosinophils Absolute: 0 10*3/uL (ref 0.0–0.7)
Eosinophils Relative: 0 % (ref 0–5)
LYMPHS PCT: 7 % — AB (ref 12–46)
Lymphs Abs: 2.1 10*3/uL (ref 0.7–4.0)
MONOS PCT: 3 % (ref 3–12)
Monocytes Absolute: 0.9 10*3/uL (ref 0.1–1.0)
NEUTROS PCT: 88 % — AB (ref 43–77)
Neutro Abs: 26.6 10*3/uL — ABNORMAL HIGH (ref 1.7–7.7)

## 2013-08-03 LAB — GLUCOSE, CAPILLARY
GLUCOSE-CAPILLARY: 94 mg/dL (ref 70–99)
Glucose-Capillary: 90 mg/dL (ref 70–99)

## 2013-08-03 LAB — CBC
HEMATOCRIT: 32 % — AB (ref 39.0–52.0)
Hemoglobin: 11.6 g/dL — ABNORMAL LOW (ref 13.0–17.0)
MCH: 30.5 pg (ref 26.0–34.0)
MCHC: 36.3 g/dL — ABNORMAL HIGH (ref 30.0–36.0)
MCV: 84.2 fL (ref 78.0–100.0)
Platelets: 130 10*3/uL — ABNORMAL LOW (ref 150–400)
RBC: 3.8 MIL/uL — AB (ref 4.22–5.81)
RDW: 13.5 % (ref 11.5–15.5)
WBC: 30.2 10*3/uL — ABNORMAL HIGH (ref 4.0–10.5)

## 2013-08-03 LAB — HEPARIN LEVEL (UNFRACTIONATED): Heparin Unfractionated: 0.65 IU/mL (ref 0.30–0.70)

## 2013-08-03 MED ORDER — OXYCODONE HCL 5 MG PO TABS: 5.0000 mg | ORAL_TABLET | ORAL | Status: DC | PRN

## 2013-08-03 MED ORDER — PIPERACILLIN-TAZOBACTAM 3.375 G IVPB
3.3750 g | Freq: Four times a day (QID) | INTRAVENOUS | Status: DC
Start: 2013-08-03 — End: 2013-08-03
  Filled 2013-08-03: qty 50

## 2013-08-03 MED ORDER — LOPERAMIDE HCL 2 MG PO CAPS
2.0000 mg | ORAL_CAPSULE | Freq: Three times a day (TID) | ORAL | Status: DC | PRN
  Administered 2013-08-04: 2 mg via ORAL
  Filled 2013-08-03 (×2): qty 1

## 2013-08-03 MED ORDER — POTASSIUM CHLORIDE 20 MEQ/15ML (10%) PO LIQD
40.0000 meq | Freq: Once | ORAL | Status: DC
Start: 2013-08-03 — End: 2013-08-03
  Filled 2013-08-03: qty 30

## 2013-08-03 MED ORDER — PIPERACILLIN-TAZOBACTAM 3.375 G IVPB
3.3750 g | Freq: Three times a day (TID) | INTRAVENOUS | Status: DC
  Administered 2013-08-03 – 2013-08-04 (×2): 3.375 g via INTRAVENOUS
  Filled 2013-08-03 (×4): qty 50

## 2013-08-03 MED ORDER — HEPARIN SODIUM (PORCINE) 5000 UNIT/ML IJ SOLN
5000.0000 [IU] | Freq: Three times a day (TID) | INTRAMUSCULAR | Status: DC
  Administered 2013-08-03 – 2013-08-07 (×12): 5000 [IU] via SUBCUTANEOUS
  Filled 2013-08-03 (×16): qty 1

## 2013-08-03 MED ORDER — PANTOPRAZOLE SODIUM 40 MG PO TBEC: 40.0000 mg | DELAYED_RELEASE_TABLET | Freq: Every day | ORAL | Status: DC

## 2013-08-03 MED ORDER — HEPARIN SODIUM (PORCINE) 5000 UNIT/ML IJ SOLN
5000.0000 [IU] | Freq: Three times a day (TID) | INTRAMUSCULAR | Status: DC
  Filled 2013-08-03 (×4): qty 1

## 2013-08-03 MED ORDER — POTASSIUM CHLORIDE CRYS ER 20 MEQ PO TBCR
40.0000 meq | EXTENDED_RELEASE_TABLET | Freq: Once | ORAL | Status: AC
  Administered 2013-08-03: 40 meq via ORAL
  Filled 2013-08-03: qty 2

## 2013-08-03 NOTE — Progress Notes (Signed)
Patient ID: Andrew Hogan, male   DOB: 31-May-1971, 43 y.o.   MRN: [id number]   SUBJECTIVE: Extubated yesterday, norepinephrine off.  BP stable.  Patient awake/alert, no complaints.  Good urine output, creatinine starting to trend down.     Marland Kitchen antiseptic oral rinse  15 mL Mouth Rinse QID  . aspirin  81 mg Oral Daily  . atorvastatin  20 mg Oral q1800  . chlorhexidine  15 mL Mouth/Throat BID  . clindamycin (CLEOCIN) IV  600 mg Intravenous Q8H  . docusate  100 mg Per Tube BID  . feeding supplement (PRO-STAT SUGAR FREE 64)  30 mL Per Tube BID  . heparin subcutaneous  5,000 Units Subcutaneous Q8H  . insulin aspart  0-15 Units Subcutaneous Q4H  . lidocaine  2 patch Transdermal Q24H  . pantoprazole (PROTONIX) IV  40 mg Intravenous Daily  . piperacillin-tazobactam (ZOSYN)  IV  2.25 g Intravenous Q6H  . polyethylene glycol  17 g Oral BID  . potassium chloride  40 mEq Oral Once  . sodium chloride  3 mL Intravenous Q12H  Heparin gtt    Filed Vitals:   08/03/13 0500 08/03/13 0600 08/03/13 0617 08/03/13 0700  BP:   102/61   Pulse: 96 78 80 83  Temp:      TempSrc:      Resp: 18 16 15 16   Height:      Weight:      SpO2: 100% 98%  99%    Intake/Output Summary (Last 24 hours) at 08/03/13 0809 Last data filed at 08/03/13 0700  Gross per 24 hour  Intake   1604 ml  Output   4475 ml  Net  -2871 ml    LABS: Basic Metabolic Panel:  Recent Labs  78/93/81 0440  08/02/13 0500 08/03/13 0530  NA 130*  < > 133* 136*  K 2.9*  < > 3.0* 3.5*  CL 88*  < > 90* 93*  CO2 23  < > 26 28  GLUCOSE 149*  < > 132* 90  BUN 85*  < > 88* 93*  CREATININE 5.74*  < > 5.47* 5.18*  CALCIUM 6.5*  < > 7.3* 7.6*  MG 2.1  --   --   --   < > = values in this interval not displayed. Liver Function Tests: No results found for this basename: AST, ALT, ALKPHOS, BILITOT, PROT, ALBUMIN,  in the last 72 hours No results found for this basename: LIPASE, AMYLASE,  in the last 72 hours CBC:  Recent Labs  08/01/13 1517  08/02/13 0500 08/03/13 0530  WBC  --  30.0* 30.2*  NEUTROABS 28.9* 27.8*  --   HGB  --  12.5* 11.6*  HCT  --  34.1* 32.0*  MCV  --  82.8 84.2  PLT  --  124* 130*   Cardiac Enzymes:  Recent Labs  07/31/13 0915 07/31/13 1625  TROPONINI >20.00* >20.00*   BNP: No components found with this basename: POCBNP,  D-Dimer:  Recent Labs  07/31/13 0915  DDIMER 12.62*   Hemoglobin A1C: No results found for this basename: HGBA1C,  in the last 72 hours Fasting Lipid Panel: No results found for this basename: CHOL, HDL, LDLCALC, TRIG, CHOLHDL, LDLDIRECT,  in the last 72 hours Thyroid Function Tests: No results found for this basename: TSH, T4TOTAL, FREET3, T3FREE, THYROIDAB,  in the last 72 hours Anemia Panel: No results found for this basename: VITAMINB12, FOLATE, FERRITIN, TIBC, IRON, RETICCTPCT,  in the last 72 hours  RADIOLOGY: Ct  Abdomen Pelvis Wo Contrast  07/31/2013   CLINICAL DATA:  Evaluate for gas in the right flank.  EXAM: CT CHEST, ABDOMEN AND PELVIS WITHOUT CONTRAST  TECHNIQUE: Multidetector CT imaging of the chest, abdomen and pelvis was performed following the standard protocol without IV contrast.  COMPARISON:  No priors.  FINDINGS: CT CHEST FINDINGS  Mediastinum: Left internal jugular central venous catheter with tip terminating in the mid superior vena cava. Endotracheal tube with tip in the upper trachea shortly above the level of the aortic arch. Nasogastric tube in the body of the stomach. Heart size is borderline enlarged. Trace amount of pericardial fluid or thickening, unlikely to be of hemodynamic significance at this time. No associated pericardial calcification. No pathologically enlarged mediastinal or hilar lymph nodes. Please note that accurate exclusion of hilar adenopathy is limited on noncontrast CT scans. Esophagus is unremarkable in appearance.  Lungs/Pleura: Small bilateral pleural effusions (right greater than left) layering dependently with some associated  passive atelectasis throughout the lower lobes of the lungs bilaterally. Some superimposed airspace consolidation in the lower lobes of the lungs bilaterally is also suspected, potentially related to mild aspiration. Several foci of peribronchovascular ground-glass attenuation are also noted in the right middle lobe, which could represent some endobronchial spread of infection/inflammation. No other suspicious appearing pulmonary nodules or masses are noted. No pneumothorax.  Musculoskeletal: There are no aggressive appearing lytic or blastic lesions noted in the visualized portions of the skeleton.  CT ABDOMEN AND PELVIS FINDINGS  Abdomen/Pelvis: Multiple nonobstructive calculi are noted within the collecting systems of the kidneys bilaterally, largest of which is in the lower pole collecting system of the left kidney measuring 1 cm. No ureteral stones or findings of urinary tract obstruction are noted at this time. The unenhanced appearance of the kidneys is otherwise unremarkable bilaterally. Several sub cm low-attenuation hepatic lesions are noted, but are incompletely characterized on today's non contrast CT examination. Diffuse decreased attenuation throughout the hepatic parenchyma, indicative of hepatic steatosis. The unenhanced appearance of the gallbladder, pancreas, spleen and bilateral adrenal glands is unremarkable.  Trace volume of free fluid in the pelvis. No larger volume of ascites. No pneumoperitoneum. No pathologic distention of small bowel. No definite lymphadenopathy identified within the abdomen or pelvis on today's non contrast CT examination. Prostate gland and urinary bladder are unremarkable in appearance. A Foley balloon catheter is present with tip in the lumen of the urinary bladder.  Musculoskeletal: Diffuse body wall edema. No definite gas identified within the soft tissues of the right flank. There are no aggressive appearing lytic or blastic lesions noted in the visualized portions  of the skeleton.  IMPRESSION: 1. No subcutaneous or intramuscular gas identified within the soft tissues of the right flank. 2. Diffuse body wall edema with small bilateral pleural effusions, trace ascites and trace amount of pericardial fluid, likely to reflect a state of anasarca. 3. In addition to the areas of passive atelectasis in the lower lobes of the lungs bilaterally there is likely some superimposed airspace consolidation, as well as small foci of airspace disease in the right middle lobe, concerning for sequela of aspiration. 4. Hepatic steatosis. 5. Nonobstructive calculi within the collecting systems of the kidneys bilaterally, largest of which measures 1 cm in the lower pole of the left kidney. No ureteral stones or findings of urinary tract obstruction at this time. 6. Support apparatus, as above.   Electronically Signed   By: Trudie Reed M.D.   On: 07/31/2013 23:08   Ct Abdomen  Pelvis Wo Contrast  07/29/2013   CLINICAL DATA:  Severe pain in the right side. No injury. Redness and swelling of the skin with suspected cellulitis. Renal failure.  EXAM: CT CHEST WITHOUT CONTRAST; CT ABDOMEN AND PELVIS WITHOUT CONTRAST  TECHNIQUE: Multidetector CT imaging of the chest was performed following the standard protocol without IV contrast.; Multidetector CT imaging of the abdomen and pelvis was performed following the standard protocol without intravenous contrast.  COMPARISON:  None.  FINDINGS: CT chest: There is extensive infiltration in the subcutaneous fat and superficial muscle layers involving the right shoulder, right axilla, right lateral chest wall, right abdominal wall and right flank extending posteriorly to the level of the midline over the spinous process. Changes extend from the shoulder down to the right groin. This is consistent with extensive cellulitis, edema, and/or contusion. There is no discrete fluid collection that would suggest evidence of a focal abscess. Fluid and edema is  demonstrated throughout the area and in between the muscle layers.  Normal heart size. Normal caliber thoracic aorta. No significant lymphadenopathy in the chest. Atelectasis in both lung bases. No pneumothorax or effusion. Esophagus is decompressed. Esophageal diverticulum may be present. Normal alignment of the thoracic spine. Sternum and ribs appear intact.  CT abdomen and pelvis: Right flank and abdominal wall changes as previously described above. Diffuse fatty infiltration of the liver. Multiple stones demonstrated in both kidneys with stones also demonstrated in the right renal pelvis. No evidence of pyelocaliectasis or ureterectasis. No ureteral stones are visualized. Small accessory spleen. The gallbladder demonstrates somewhat diffuse increased density throughout, suggesting sludge or milk of calcium. The unenhanced appearance of the spleen, pancreas, adrenal glands, abdominal aorta, inferior vena cava, and retroperitoneal lymph nodes is unremarkable the stomach, small bowel, and colon are not abnormally distended. No free air or free fluid in the abdomen. No infiltration in the intra-abdominal fat.  Pelvis: Prostate gland is not enlarged. Bladder wall is not thickened. No free or loculated pelvic fluid collections. The appendix is normal. No evidence of diverticulitis. Mild degenerative changes in the lumbar spine with normal alignment. Pelvis, sacrum thumb and hips appear intact. Benign-appearing area of sclerosis in the right hemipelvis.  IMPRESSION: Extensive subcutaneous soft tissue and intramuscular cellulitis, infiltration/edema, or contusion along the right side of the chest and abdomen wall extending from the level of the shoulder to the pelvis. No discrete abscess is identified.   Electronically Signed   By: Burman Nieves M.D.   On: 07/29/2013 04:37   Dg Chest 2 View  07/30/2013   CLINICAL DATA:  Pain.  EXAM: CHEST  2 VIEW  COMPARISON:  Chest x-ray 07/30/2013.  Chest CT 07/29/2013.   FINDINGS: Prominent bibasilar atelectasis and/or pneumonia noted. No pneumothorax. Stable cardiomegaly, no pulmonary venous distention or prominent pleural effusion. Mild pleural thickening present. No acute osseous abnormality. Degenerative changes thoracic spine.  IMPRESSION: Dense bibasilar atelectasis and/or infiltrates.   Electronically Signed   By: Maisie Fus  Register   On: 07/30/2013 14:09   Dg Abd 1 View  07/30/2013   CLINICAL DATA:  Pain.  EXAM: ABDOMEN - 1 VIEW  COMPARISON:  CT 07/29/2013.  FINDINGS: As noted on CT diffuse right side secondary soft tissue swelling is present. Air-filled nondilated loops of small and large bowel noted. This is a nonspecific finding. Bilateral nephrolithiasis. No acute bony abnormality. Sclerotic density noted in the right ilium most consistent with bone island. No other sclerotic bony densities noted to suggest metastatic disease.  IMPRESSION: 1. Soft tissue swelling  of the right abdomen subcutaneous tissue, reference made to recent CT report. 2. Bilateral nephrolithiasis. 3. Nonspecific nondistended air-filled loops of small large bowel noted. 4. Sclerotic density node in the right iliac wing consistent with bone island .   Electronically Signed   By: Maisie Fus  Register   On: 07/30/2013 13:33   Ct Chest Wo Contrast  07/31/2013   CLINICAL DATA:  Evaluate for gas in the right flank.  EXAM: CT CHEST, ABDOMEN AND PELVIS WITHOUT CONTRAST  TECHNIQUE: Multidetector CT imaging of the chest, abdomen and pelvis was performed following the standard protocol without IV contrast.  COMPARISON:  No priors.  FINDINGS: CT CHEST FINDINGS  Mediastinum: Left internal jugular central venous catheter with tip terminating in the mid superior vena cava. Endotracheal tube with tip in the upper trachea shortly above the level of the aortic arch. Nasogastric tube in the body of the stomach. Heart size is borderline enlarged. Trace amount of pericardial fluid or thickening, unlikely to be of  hemodynamic significance at this time. No associated pericardial calcification. No pathologically enlarged mediastinal or hilar lymph nodes. Please note that accurate exclusion of hilar adenopathy is limited on noncontrast CT scans. Esophagus is unremarkable in appearance.  Lungs/Pleura: Small bilateral pleural effusions (right greater than left) layering dependently with some associated passive atelectasis throughout the lower lobes of the lungs bilaterally. Some superimposed airspace consolidation in the lower lobes of the lungs bilaterally is also suspected, potentially related to mild aspiration. Several foci of peribronchovascular ground-glass attenuation are also noted in the right middle lobe, which could represent some endobronchial spread of infection/inflammation. No other suspicious appearing pulmonary nodules or masses are noted. No pneumothorax.  Musculoskeletal: There are no aggressive appearing lytic or blastic lesions noted in the visualized portions of the skeleton.  CT ABDOMEN AND PELVIS FINDINGS  Abdomen/Pelvis: Multiple nonobstructive calculi are noted within the collecting systems of the kidneys bilaterally, largest of which is in the lower pole collecting system of the left kidney measuring 1 cm. No ureteral stones or findings of urinary tract obstruction are noted at this time. The unenhanced appearance of the kidneys is otherwise unremarkable bilaterally. Several sub cm low-attenuation hepatic lesions are noted, but are incompletely characterized on today's non contrast CT examination. Diffuse decreased attenuation throughout the hepatic parenchyma, indicative of hepatic steatosis. The unenhanced appearance of the gallbladder, pancreas, spleen and bilateral adrenal glands is unremarkable.  Trace volume of free fluid in the pelvis. No larger volume of ascites. No pneumoperitoneum. No pathologic distention of small bowel. No definite lymphadenopathy identified within the abdomen or pelvis on  today's non contrast CT examination. Prostate gland and urinary bladder are unremarkable in appearance. A Foley balloon catheter is present with tip in the lumen of the urinary bladder.  Musculoskeletal: Diffuse body wall edema. No definite gas identified within the soft tissues of the right flank. There are no aggressive appearing lytic or blastic lesions noted in the visualized portions of the skeleton.  IMPRESSION: 1. No subcutaneous or intramuscular gas identified within the soft tissues of the right flank. 2. Diffuse body wall edema with small bilateral pleural effusions, trace ascites and trace amount of pericardial fluid, likely to reflect a state of anasarca. 3. In addition to the areas of passive atelectasis in the lower lobes of the lungs bilaterally there is likely some superimposed airspace consolidation, as well as small foci of airspace disease in the right middle lobe, concerning for sequela of aspiration. 4. Hepatic steatosis. 5. Nonobstructive calculi within the collecting  systems of the kidneys bilaterally, largest of which measures 1 cm in the lower pole of the left kidney. No ureteral stones or findings of urinary tract obstruction at this time. 6. Support apparatus, as above.   Electronically Signed   By: Trudie Reed M.D.   On: 07/31/2013 23:08   Ct Chest Wo Contrast  07/29/2013   CLINICAL DATA:  Severe pain in the right side. No injury. Redness and swelling of the skin with suspected cellulitis. Renal failure.  EXAM: CT CHEST WITHOUT CONTRAST; CT ABDOMEN AND PELVIS WITHOUT CONTRAST  TECHNIQUE: Multidetector CT imaging of the chest was performed following the standard protocol without IV contrast.; Multidetector CT imaging of the abdomen and pelvis was performed following the standard protocol without intravenous contrast.  COMPARISON:  None.  FINDINGS: CT chest: There is extensive infiltration in the subcutaneous fat and superficial muscle layers involving the right shoulder, right  axilla, right lateral chest wall, right abdominal wall and right flank extending posteriorly to the level of the midline over the spinous process. Changes extend from the shoulder down to the right groin. This is consistent with extensive cellulitis, edema, and/or contusion. There is no discrete fluid collection that would suggest evidence of a focal abscess. Fluid and edema is demonstrated throughout the area and in between the muscle layers.  Normal heart size. Normal caliber thoracic aorta. No significant lymphadenopathy in the chest. Atelectasis in both lung bases. No pneumothorax or effusion. Esophagus is decompressed. Esophageal diverticulum may be present. Normal alignment of the thoracic spine. Sternum and ribs appear intact.  CT abdomen and pelvis: Right flank and abdominal wall changes as previously described above. Diffuse fatty infiltration of the liver. Multiple stones demonstrated in both kidneys with stones also demonstrated in the right renal pelvis. No evidence of pyelocaliectasis or ureterectasis. No ureteral stones are visualized. Small accessory spleen. The gallbladder demonstrates somewhat diffuse increased density throughout, suggesting sludge or milk of calcium. The unenhanced appearance of the spleen, pancreas, adrenal glands, abdominal aorta, inferior vena cava, and retroperitoneal lymph nodes is unremarkable the stomach, small bowel, and colon are not abnormally distended. No free air or free fluid in the abdomen. No infiltration in the intra-abdominal fat.  Pelvis: Prostate gland is not enlarged. Bladder wall is not thickened. No free or loculated pelvic fluid collections. The appendix is normal. No evidence of diverticulitis. Mild degenerative changes in the lumbar spine with normal alignment. Pelvis, sacrum thumb and hips appear intact. Benign-appearing area of sclerosis in the right hemipelvis.  IMPRESSION: Extensive subcutaneous soft tissue and intramuscular cellulitis,  infiltration/edema, or contusion along the right side of the chest and abdomen wall extending from the level of the shoulder to the pelvis. No discrete abscess is identified.   Electronically Signed   By: Burman Nieves M.D.   On: 07/29/2013 04:37   US Renal  07/30/2013   CLINICAL DATA:  Acute renal insufficiency.  Elevated creatinine.  EXAM: RENAL/URINARY TRACT ULTRASOUND COMPLETE  COMPARISON:  CT 07/29/2013.  FINDINGS: Right Kidney:  Length: 12.3 cm. Echogenicity within normal limits. No mass or hydronephrosis visualized.  Left Kidney:  Length: 13.1 cm. Echogenicity within normal limits. 6 mm stone over the lower pole. No mass or hydronephrosis visualized.  Bladder:  Decompressed as Foley catheter is present.  IMPRESSION: Normal size kidneys without evidence of hydronephrosis.  6 mm stone over the left lower pole.   Electronically Signed   By: Elberta Fortis M.D.   On: 07/30/2013 21:54   Portable Chest Xray  07/31/2013   CLINICAL DATA:  Intubation.  EXAM: PORTABLE CHEST - 1 VIEW  COMPARISON:  Earlier the same date.  FINDINGS: 1418 hr. The endotracheal tube is well positioned in the midtrachea. A nasogastric tube projects below the diaphragm. Left IJ central venous catheter is unchanged at the level of the mid SVC. There is persistent pulmonary edema with increased opacity at the left lung base, likely due to a combination of pleural fluid and basilar airspace disease. No pneumothorax is evident. The heart size and mediastinal contours are stable.  IMPRESSION: Satisfactory position of the newly placed endotracheal and nasogastric tubes. Increased left basilar airspace disease and probable adjacent pleural fluid.   Electronically Signed   By: Roxy HorsemanBill  Veazey M.D.   On: 07/31/2013 14:34   Dg Chest Port 1 View  07/31/2013   CLINICAL DATA:  Shortness of breath  EXAM: PORTABLE CHEST - 1 VIEW  COMPARISON:  07/31/2013  FINDINGS: Left-sided jugular central venous catheter in unchanged position. Bilateral interstitial  and alveolar airspace opacities with bilateral central pulmonary vascular prominence. No pleural effusions or pneumothorax. Stable cardiomediastinal silhouette. Unremarkable osseous structures.  IMPRESSION: Bilateral interstitial and alveolar airspace opacities as can be seen with an infectious or inflammatory process versus pulmonary edema.   Electronically Signed   By: Elige KoHetal  Patel   On: 07/31/2013 11:38   Dg Chest Port 1 View  07/31/2013   CLINICAL DATA:  Shortness of breath, status post line placement  EXAM: PORTABLE CHEST - 1 VIEW  COMPARISON:  07/30/2013  FINDINGS: There is a left-sided jugular central venous catheter with the tip projecting over the confluence of the left brachiocephalic and SVC. There are bilateral interstitial and alveolar airspace opacities primarily at the lung bases. There is no pleural effusion or pneumothorax. Stable cardiomediastinal silhouette. The osseous structures are unremarkable.  IMPRESSION: 1. Left-sided jugular central venous catheter with the tip projecting over the confluence of the left brachiocephalic and SVC. 2. Bilateral interstitial and alveolar airspace opacities as can be seen with an infectious or inflammatory etiology versus interstitial edema.   Electronically Signed   By: Elige KoHetal  Patel   On: 07/31/2013 07:59   Dg Chest Port 1 View  07/30/2013   CLINICAL DATA:  Shortness of breath  EXAM: PORTABLE CHEST - 1 VIEW  COMPARISON:  07/28/2013  FINDINGS: The heart size and mediastinal contours are within normal limits. Both lungs are clear. The visualized skeletal structures are unremarkable.  IMPRESSION: No active disease.   Electronically Signed   By: Alcide CleverMark  Lukens M.D.   On: 07/30/2013 08:13   Dg Chest Portable 1 View  07/28/2013   CLINICAL DATA:  Chest pain  EXAM: PORTABLE CHEST - 1 VIEW  COMPARISON:  None.  FINDINGS: The heart size and mediastinal contours are within normal limits. There is no focal infiltrate, pulmonary edema, or pleural effusion. The  visualized skeletal structures are unremarkable.  IMPRESSION: No active disease.   Electronically Signed   By: Sherian ReinWei-Chen  Lin M.D.   On: 07/28/2013 19:37    PHYSICAL EXAM General: NAD  Neck: JVP 8 cm Lungs: Decreased breath sounds at bases.  CV: Nondisplaced PMI.  Heart regular S1/S2, no S3/S4, no murmur.  1+ edema lower legs.   Abdomen: Soft,  no hepatosplenomegaly, no distention.  Extremities: No clubbing or cyanosis.  Skin: Erythma and blistering on right flank.  TELEMETRY: Reviewed telemetry pt in NSR  ASSESSMENT AND PLAN: 43 yo with minimal PMH presented with extensive right chest wall and abdominal wall cellulitis/myositis with  GAS bacteremia. On 1/1, he developed hypotension and ECG changes and was intubated and started on pressors.  Extubated on 1/3 and norepinphrine turned off.  1. ID: GAS bacteremia with extensive cellulitis/myositis right chest wall/abdominal wall. Septic shock resolved, off norepinephrine. 2. AKI: Suspect this is due to septic shock + extensive NSAID use at home. Good UOP, suspect post-ATN diuresis.  Creatinine starting to come down. 3. Cardiac: Elevated TnI and ST elevation in the setting of septic shock. No chest pain. Patient does not have risk factors for CAD. Echo done 1/1 showed normal EF and no wall motion abnormalities.  ST elevation resolved with improvement in BP on norepinephrine and ECG 1/2 showed no STE and no Q waves.  The elevation in troponin may all represent demand ischemia with potential septic cardiomyopathy.  Manage noninvasively for now.  Can continue ASA 81 and statin, would stop heparin gtt today, use subcutaneous heparin.   Marca Ancona 08/03/2013 8:09 AM

## 2013-08-03 NOTE — Progress Notes (Signed)
Seen, agree with above.   Antibiotics for cellulitis.

## 2013-08-03 NOTE — Progress Notes (Addendum)
ANTIBIOTIC CONSULT NOTE - FOLLOW UP  Pharmacy Consult for Zosyn Indication: cellulitis  No Known Allergies  Patient Measurements: Height: 5\' 11"  (180.3 cm) Weight:  (unable to weight pt due to bed) IBW/kg (Calculated) : 75.3  Vital Signs: Temp: 98 F (36.7 C) (01/04 0400) Temp src: Oral (01/04 0400) BP: 102/61 mmHg (01/04 0617) Pulse Rate: 80 (01/04 0900) Intake/Output from previous [place]: 01/03 0701 - 01/04 0700 In: 1677 [I.V.:1007; NG/GT:120; IV Piggyback:550] Out: 5375 [Urine:5375] Intake/Output from this shift: Total I/O In: 48 [I.V.:48] Out: -   Labs:  Recent Labs  08/01/13 0340  08/01/13 1420 08/02/13 0500 08/03/13 0530  WBC 33.6*  --   --  30.0* 30.2*  HGB 12.8*  --   --  12.5* 11.6*  PLT 128*  --   --  124* 130*  CREATININE  --   < > 5.67* 5.47* 5.18*  < > = values in this interval not displayed. Estimated Creatinine Clearance: 23 ml/min (by C-G formula based on Cr of 5.18).  Recent Labs  08/01/13 1300  VANCORANDOM 8.2     Microbiology: Recent Results (from the past 720 hour(s))  CULTURE, BLOOD (ROUTINE X 2)     Status: None   Collection Time    07/28/13  7:05 PM      Result Value Range Status   Specimen Description BLOOD RIGHT ANTECUBITAL   Final   Special Requests NONE BOTTLES DRAWN AEROBIC AND ANAEROBIC [facility] EACH   Final   Culture  Setup Time     Final   Value: 07/29/2013 01:25     Performed at [facility]   Culture     Final   Value: GROUP A STREP (S.PYOGENES) ISOLATED     Note: Gram Stain Report Called to,Read Back By and Verified With: Charlsie Merles 07/29/13 1405 BY SMITHERSJ     Performed at [facility]   Report Status PENDING   Incomplete  CULTURE, BLOOD (ROUTINE X 2)     Status: None   Collection Time    07/28/13  7:25 PM      Result Value Range Status   Specimen Description BLOOD LEFT ANTECUBITAL   Final   Special Requests NONE BOTTLES DRAWN AEROBIC AND ANAEROBIC 10CC EACH   Final   Culture  Setup Time     Final    Value: 07/29/2013 01:25     Performed at [facility]   Culture     Final   Value: GROUP A STREP (S.PYOGENES) ISOLATED     Note: Gram Stain Report Called to,Read Back By and Verified With: Charlsie Merles 07/29/13 1405 BY SMITHERSJ     Performed at [facility]   Report Status PENDING   Incomplete  URINE CULTURE     Status: None   Collection Time    07/29/13 12:43 AM      Result Value Range Status   Specimen Description URINE, RANDOM   Final   Special Requests NONE   Final   Culture  Setup Time     Final   Value: 07/29/2013 08:59     Performed at [facility]   Colony Count     Final   Value: NO GROWTH     Performed at [facility]   Culture     Final   Value: NO GROWTH     Performed at [facility]   Report Status 07/30/2013 FINAL   Final    Anti-infectives  Start     Dose/Rate Route Frequency Ordered Stop   08/01/13 1300  piperacillin-tazobactam (ZOSYN) IVPB 2.25 g     2.25 g 100 mL/hr over 30 Minutes Intravenous 4 times per Cott 08/01/13 1123     07/31/13 0600  vancomycin (VANCOCIN) IVPB 1000 mg/200 mL premix  Status:  Discontinued     1,000 mg 200 mL/hr over 60 Minutes Intravenous Every 48 hours 07/29/13 0305 07/30/13 1109   07/30/13 2200  penicillin G potassium 4 Million Units in dextrose 5 % 250 mL IVPB  Status:  Discontinued     4 Million Units 250 mL/hr over 60 Minutes Intravenous 3 times per Duerr 07/30/13 1258 08/01/13 0921   07/30/13 1400  clindamycin (CLEOCIN) IVPB 600 mg    Comments:  Pharmacy may adjust antiboitics as needed; GAS, ? necrotizing   600 mg 100 mL/hr over 30 Minutes Intravenous 3 times per Trevino 07/30/13 1109     07/30/13 1200  penicillin G potassium 4 Million Units in dextrose 5 % 250 mL IVPB    Comments:  Group A strep   4 Million Units 250 mL/hr over 60 Minutes Intravenous 6 times per Check 07/30/13 1109 07/30/13 1516   07/29/13 0400  vancomycin (VANCOCIN) IVPB 1000 mg/200 mL premix     1,000 mg 200 mL/hr  over 60 Minutes Intravenous  Once 07/29/13 0305 07/29/13 0752   07/29/13 0400  piperacillin-tazobactam (ZOSYN) IVPB 3.375 g  Status:  Discontinued     3.375 g 12.5 mL/hr over 240 Minutes Intravenous 3 times per Kulak 07/29/13 0305 07/30/13 1109   07/28/13 1945  vancomycin (VANCOCIN) IVPB 1000 mg/200 mL premix     1,000 mg 200 mL/hr over 60 Minutes Intravenous  Once 07/28/13 1934 07/28/13 2105   07/28/13 1945  piperacillin-tazobactam (ZOSYN) IVPB 3.375 g     3.375 g 12.5 mL/hr over 240 Minutes Intravenous  Once 07/28/13 1934 07/28/13 2033      Assessment: 43 yo M continuing on Zosyn for Group A strep Bacteremia and cellulitis/myositis concerning for necrotizing fasciitis, Infectious Disease following. Also noted concerns for aspiration PNA as well.  Patient also receiving clindamycin for toxin inhibition.   He is in AKI with SCr down to 5.18 with estimated CrCl~23, in recovery phase per nephrology note.  Patient is afebrile and WBC is still elevated at 30.2.  Goal of Therapy:  Resolution of Infection  Plan:  - change Zosyn IV to 3.375gm q8h with extended infusion d/t improved renal function - continue to monitor renal function, temperature curve, WBC, and clinical progression  Harrold DonathNathan E. Achilles Dunkope, PharmD Clinical Pharmacist - Resident Pager: [phone] Pharmacy: [phone] 08/03/2013 12:08 PM

## 2013-08-03 NOTE — Progress Notes (Signed)
Pt's left IJ TLC site noted to be leaking blood and saturating drsg.  Pt remains on heparin gtt presently.  MD Molli Knock(Yacoub) notified.  Per MD, will raise HOB and apply pressure drsg.  Will continue to monitor.

## 2013-08-03 NOTE — Progress Notes (Signed)
[facility] FOR [facility] LAB REPLACEMENT ONLY  The patient does not apply for the [facility] based on the criteria listed below:   Is GFR >/= 40 ml/min? no  Patient's GFR today is 14  Abnormal electrolyte(s): K3.5   If a panic level lab has been reported, has the CCM MD in charge been notified? yes.   Physician:  David StallJ Yacoub, MD  Melrose Nakayamahisholm, Latravis Grine William 08/03/2013 6:30 AM

## 2013-08-03 NOTE — Progress Notes (Signed)
Patient ID: Andrew Hogan, male   DOB: Mar 29, 1971, 43 y.o.   MRN: [id number]    Subjective: Pt extubated and feels well.  No c/o.  Feels like he could go for a run  Objective: Vital signs in last 24 hours: Temp:  [98 F (36.7 C)-99.9 F (37.7 C)] 98 F (36.7 C) (01/04 0400) Pulse Rate:  [78-107] 83 (01/04 0700) Resp:  [12-28] 16 (01/04 0700) BP: (92-102)/(56-64) 102/61 mmHg (01/04 0617) SpO2:  [94 %-100 %] 99 % (01/04 0700) Arterial Line BP: (111-137)/(57-71) 125/62 mmHg (01/04 0700) FiO2 (%):  [40 %] 40 % (01/03 1151) Last BM Date: 08/02/13  Intake/Output from previous [place]: 01/03 0701 - 01/04 0700 In: 1677 [I.V.:1007; NG/GT:120; IV Piggyback:550] Out: 5375 [Urine:5375] Intake/Output this shift:    PE: Skin: erythema almost all gone along his right chest wall, flank, and right groin.  Serous bullae are noted with one area ruptured along the flank.  Induration much less.  Chest area much softer.  Lab Results:   Recent Labs  08/02/13 0500 08/03/13 0530  WBC 30.0* 30.2*  HGB 12.5* 11.6*  HCT 34.1* 32.0*  PLT 124* 130*   BMET  Recent Labs  08/02/13 0500 08/03/13 0530  NA 133* 136*  K 3.0* 3.5*  CL 90* 93*  CO2 26 28  GLUCOSE 132* 90  BUN 88* 93*  CREATININE 5.47* 5.18*  CALCIUM 7.3* 7.6*   PT/INR  Recent Labs  07/31/13 0915  LABPROT 15.1  INR 1.22   CMP     Component Value Date/Time   NA 136* 08/03/2013 0530   K 3.5* 08/03/2013 0530   CL 93* 08/03/2013 0530   CO2 28 08/03/2013 0530   GLUCOSE 90 08/03/2013 0530   BUN 93* 08/03/2013 0530   CREATININE 5.18* 08/03/2013 0530   CALCIUM 7.6* 08/03/2013 0530   PROT 5.1* 07/31/2013 0425   ALBUMIN 1.4* 07/31/2013 0425   AST 69* 07/31/2013 0425   ALT 39 07/31/2013 0425   ALKPHOS 87 07/31/2013 0425   BILITOT 2.2* 07/31/2013 0425   GFRNONAA 12* 08/03/2013 0530   GFRAA 14* 08/03/2013 0530   Lipase     Component Value Date/Time   LIPASE 9* 07/29/2013 0300       Studies/Results: Dg Chest Port 1 View  08/03/2013   CLINICAL DATA:   Extubation.  EXAM: PORTABLE CHEST - 1 VIEW  COMPARISON:  Portable chest 08/02/2013.  CT 07/31/2013.  FINDINGS: 0512 hr. Interval extubation with removal of the nasogastric tube. The left IJ central venous catheter is unchanged at the lower SVC level. There is improved aeration of the lungs with resolution of edema. Minimal left lower lobe atelectasis remains. There is no significant pleural effusion or pneumothorax. Mild cardiomegaly is stable.  IMPRESSION: Significantly improved pulmonary aeration with resolution of edema following extubation. No acute findings demonstrated.   Electronically Signed   By: Roxy Horseman M.D.   On: 08/03/2013 07:58   Dg Chest Port 1 View  08/02/2013   CLINICAL DATA:  Evaluate for infiltrates  EXAM: PORTABLE CHEST - 1 VIEW  COMPARISON:  July 31, 2013  FINDINGS: Endotracheal tube ends at the thoracic inlet, projecting at the level of the clavicular heads. Left IJ catheter in unchanged position. Enteric tube crosses the diaphragm.  Unchanged cardiopericardial enlargement. Haziness of the bilateral chest is relatively similar given differences in projection, pleural fluid and atelectasis based on previous CT. In the lingular region, the lung is better aerated.  IMPRESSION: 1. Good positioning of tubes and lines.  2. Layering pleural effusions, no indication of interval increase. 3. Improved left lung aeration in this patient with atelectasis and consolidation by recent CT.   Electronically Signed   By: Tiburcio PeaJonathan  Watts M.D.   On: 08/02/2013 06:12    Anti-infectives: Anti-infectives   Start     Dose/Rate Route Frequency Ordered Stop   08/01/13 1300  piperacillin-tazobactam (ZOSYN) IVPB 2.25 g     2.25 g 100 mL/hr over 30 Minutes Intravenous 4 times per Paletta 08/01/13 1123     07/31/13 0600  vancomycin (VANCOCIN) IVPB 1000 mg/200 mL premix  Status:  Discontinued     1,000 mg 200 mL/hr over 60 Minutes Intravenous Every 48 hours 07/29/13 0305 07/30/13 1109   07/30/13 2200   penicillin G potassium 4 Million Units in dextrose 5 % 250 mL IVPB  Status:  Discontinued     4 Million Units 250 mL/hr over 60 Minutes Intravenous 3 times per Malkowski 07/30/13 1258 08/01/13 0921   07/30/13 1400  clindamycin (CLEOCIN) IVPB 600 mg    Comments:  Pharmacy may adjust antiboitics as needed; GAS, ? necrotizing   600 mg 100 mL/hr over 30 Minutes Intravenous 3 times per Hutcherson 07/30/13 1109     07/30/13 1200  penicillin G potassium 4 Million Units in dextrose 5 % 250 mL IVPB    Comments:  Group A strep   4 Million Units 250 mL/hr over 60 Minutes Intravenous 6 times per Sarti 07/30/13 1109 07/30/13 1516   07/29/13 0400  vancomycin (VANCOCIN) IVPB 1000 mg/200 mL premix     1,000 mg 200 mL/hr over 60 Minutes Intravenous  Once 07/29/13 0305 07/29/13 0752   07/29/13 0400  piperacillin-tazobactam (ZOSYN) IVPB 3.375 g  Status:  Discontinued     3.375 g 12.5 mL/hr over 240 Minutes Intravenous 3 times per Gitto 07/29/13 0305 07/30/13 1109   07/28/13 1945  vancomycin (VANCOCIN) IVPB 1000 mg/200 mL premix     1,000 mg 200 mL/hr over 60 Minutes Intravenous  Once 07/28/13 1934 07/28/13 2105   07/28/13 1945  piperacillin-tazobactam (ZOSYN) IVPB 3.375 g     3.375 g 12.5 mL/hr over 240 Minutes Intravenous  Once 07/28/13 1934 07/28/13 2033       Assessment/Plan  1. Right chest wall, flank, and groin cellulitis Patient Active Problem List   Diagnosis Date Noted  . ARDS (adult respiratory distress syndrome) 07/31/2013  . Acute respiratory failure with hypoxia 07/31/2013  . Septic shock(785.52) 07/31/2013  . Cellulitis 07/29/2013  . Acute renal failure 07/29/2013  . Diarrhea 07/29/2013  . Bacteremia due to Gram-positive bacteria 07/29/2013  . Severe sepsis(995.92) 07/29/2013  . Dehydration 07/29/2013  . Metabolic acidosis 07/29/2013  . Hypokalemia 07/29/2013  . Rhabdomyolysis 07/29/2013  . Fatty infiltration of liver 07/29/2013  . Chest wall pain 07/28/2013   Plan: 1. Will put mepitel  boarder over macerated skin area from ruptured blisters to help protect it and keep it moist. 2. wBC persistent at 30K, but cellulitis is continuing to improve daily.  No surgical intervention currently needed.  LOS: 6 days    Tijuana Scheidegger E 08/03/2013, 8:38 AM Pager: [phone]

## 2013-08-03 NOTE — Progress Notes (Signed)
Assessment:  1 Non-Oliguric AKI patient, recovery phase (baseline Cr unknown), due to septic shock  2 Septic shock, due to Group A strep bacteremia and cellulitis/myositis.  3 Metabolic Acidosis, resolved  4 hypokalemia, hyponatremia, hypoalbuminemia  5 Elevated troponin and transient EKG changes felt stress induced   REC: Will watch for fluid needs.  No IVF replacement at current time. Give KCl  Subjective: Interval History: Good UOP 5375cc, some diarrhea  Objective: Vital signs in last 24 hours: Temp:  [98 F (36.7 C)-99.9 F (37.7 C)] 98 F (36.7 C) (01/04 0400) Pulse Rate:  [78-107] 83 (01/04 0700) Resp:  [12-28] 16 (01/04 0700) BP: (92-134)/(56-64) 102/61 mmHg (01/04 0617) SpO2:  [94 %-100 %] 99 % (01/04 0700) Arterial Line BP: (111-139)/(57-71) 125/62 mmHg (01/04 0700) FiO2 (%):  [40 %] 40 % (01/03 1151) Weight change:   Intake/Output from previous Boys: 01/03 0701 - 01/04 0700 In: 1677 [I.V.:1007; NG/GT:120; IV Piggyback:550] Out: 5375 [Urine:5375] Intake/Output this shift:   General appearance: alert and cooperative Resp: clear to auscultation bilaterally Chest wall: no tenderness Cardio: regular rate and rhythm, S1, S2 normal, no murmur, click, rub or gallop Extremities: edema 1-2+  Lab Results:  Recent Labs  08/02/13 0500 08/03/13 0530  WBC 30.0* 30.2*  HGB 12.5* 11.6*  HCT 34.1* 32.0*  PLT 124* 130*   BMET:  Recent Labs  08/02/13 0500 08/03/13 0530  NA 133* 136*  K 3.0* 3.5*  CL 90* 93*  CO2 26 28  GLUCOSE 132* 90  BUN 88* 93*  CREATININE 5.47* 5.18*  CALCIUM 7.3* 7.6*   No results found for this basename: PTH,  in the last 72 hours Iron Studies: No results found for this basename: IRON, TIBC, TRANSFERRIN, FERRITIN,  in the last 72 hours Studies/Results: Dg Chest Port 1 View  08/02/2013   CLINICAL DATA:  Evaluate for infiltrates  EXAM: PORTABLE CHEST - 1 VIEW  COMPARISON:  July 31, 2013  FINDINGS: Endotracheal tube ends at the thoracic  inlet, projecting at the level of the clavicular heads. Left IJ catheter in unchanged position. Enteric tube crosses the diaphragm.  Unchanged cardiopericardial enlargement. Haziness of the bilateral chest is relatively similar given differences in projection, pleural fluid and atelectasis based on previous CT. In the lingular region, the lung is better aerated.  IMPRESSION: 1. Good positioning of tubes and lines. 2. Layering pleural effusions, no indication of interval increase. 3. Improved left lung aeration in this patient with atelectasis and consolidation by recent CT.   Electronically Signed   By: Tiburcio PeaJonathan  Watts M.D.   On: 08/02/2013 06:12   Scheduled: . antiseptic oral rinse  15 mL Mouth Rinse QID  . aspirin  81 mg Oral Daily  . atorvastatin  20 mg Oral q1800  . chlorhexidine  15 mL Mouth/Throat BID  . clindamycin (CLEOCIN) IV  600 mg Intravenous Q8H  . docusate  100 mg Per Tube BID  . feeding supplement (PRO-STAT SUGAR FREE 64)  30 mL Per Tube BID  . insulin aspart  0-15 Units Subcutaneous Q4H  . lidocaine  2 patch Transdermal Q24H  . pantoprazole (PROTONIX) IV  40 mg Intravenous Daily  . piperacillin-tazobactam (ZOSYN)  IV  2.25 g Intravenous Q6H  . polyethylene glycol  17 g Oral BID  . sodium chloride  3 mL Intravenous Q12H     LOS: 6 days   Shakeera Rightmyer C 08/03/2013,7:59 AM

## 2013-08-03 NOTE — Progress Notes (Signed)
PULMONARY / CRITICAL CARE MEDICINE  Name: Andrew Hogan MRN: [id number] DOB: [birth year]    ADMISSION DATE:  07/28/2013 CONSULTATION DATE:  07/31/13  REFERRING MD :  [facility] PRIMARY SERVICE: PCCM  CHIEF COMPLAINT:  Abdominal pain  BRIEF PATIENT DESCRIPTION: 43 y/o male with no past medical history was admitted on 12/29 with myositis and cellulitis of his abdominal wall on 12/29.  He developed hypotension, renal failure, and an STEMI on 1/1 in the early AM, PCCM consulted.  SIGNIFICANT EVENTS / STUDIES:  12/30 - CT chest/abdomen/pelvis >>> Extensive soft tissue and intramuscular cellulitis/infiltration and edema from left shoulder to pelvis, no discrete abscess identified 12/30 - TTE >>> EF 60-65%, grade 1 diastolic dysfunction 12/31 - Renal U/S > Normal size kidneys, no hydro, 6mm stone left lower pole 01/01 - TTE >>> EF 55-60%, no RWMA PAP 42 torr 01/01 - CT chest/abdomen/pelvis >>> NO soft tissue gas, anasarca, pleural effusions, trace pericardial  effusion, bilateral airspace disease (new RML), hepatic steatosis, bilateral nonobstructive calculi 01/03 - extubated, norepinephrine weaned off  LINES / TUBES: OETT 1/1 >>> 1/3 OGT 1/1 >>> 1/3 L IJ CVL 1/1 >> R rad a-line 1/1 >>> Foley 1/1 >>>  CULTURES: 12/29 Blood >> Group a strep 12/30 Urine >> neg  ANTIBIOTICS: Vancomycin 12/29 x1; 1/2 >>> Zosyn 12/29 >>> 12/30; 1/2 >>> Clindamycin 12/31 >>> Penicillin G 12/31 >>> 1/2  INTERVAL HISTORY:  Patient extubated and weaned off levophed yesterday.  This morning, he notes no dyspnea, cough, or pain.  He requests food.  Mild bleeding from L IJ CVL overnight, controlled with pressure dressing.  Cr continues to downtrend slowly, with > 5L UOP yesterday.  VITAL SIGNS: Temp:  [98 F (36.7 C)-99.9 F (37.7 C)] 98 F (36.7 C) (01/04 0400) Pulse Rate:  [78-107] 80 (01/04 0617) Resp:  [12-28] 15 (01/04 0617) BP: (92-134)/(56-64) 102/61 mmHg (01/04 0617) SpO2:  [94 %-100 %] 98 % (01/04  0600) Arterial Line BP: (111-144)/(57-71) 117/60 mmHg (01/04 0600) FiO2 (%):  [40 %] 40 % (01/03 1151)  HEMODYNAMICS: CVP:  [3 mmHg-77 mmHg] 12 mmHg  INTAKE / OUTPUT: Intake/Output     01/03 0701 - 01/04 0700   I.V. (mL/kg) 979 (8.9)   NG/GT 120   IV Piggyback 550   Total Intake(mL/kg) 1649 (15)   Urine (mL/kg/hr) 5375 (2)   Total Output 5375   Net -3726         PHYSICAL EXAMINATION: General: alert, cooperative, and in no apparent distress HEENT: PERRL, EOMI, oropharynx clear and non-erythematous  Neck: supple Lungs: clear to ascultation bilaterally, normal work of respiration, no wheezes, rales, ronchi Heart: regular rate and rhythm, no murmurs, gallops, or rubs Abdomen: soft, non-tender, non-distended, normal bowel sounds. Only mild abd wall erythema persists Extremities: 1+ pitting edema bilaterally Neurologic: alert & oriented X3, cranial nerves II-XII intact, strength grossly intact, sensation intact to light touch  LABS:  CBC  Recent Labs Lab 08/01/13 0340 08/02/13 0500 08/03/13 0530  WBC 33.6* 30.0* 30.2*  HGB 12.8* 12.5* 11.6*  HCT 34.8* 34.1* 32.0*  PLT 128* 124* 130*   Coag's  Recent Labs Lab 07/28/13 1911 07/29/13 0300 07/31/13 0915  APTT  --  46*  --   INR 1.10 1.25 1.22   BMET  Recent Labs Lab 08/01/13 1420 08/02/13 0500 08/03/13 0530  NA 131* 133* 136*  K 3.4* 3.0* 3.5*  CL 90* 90* 93*  CO2 25 26 28   BUN 88* 88* 93*  CREATININE 5.67* 5.47* 5.18*  GLUCOSE 112*  132* 90   Electrolytes  Recent Labs Lab 08/01/13 0440 08/01/13 1420 08/02/13 0500 08/03/13 0530  CALCIUM 6.5* 7.0* 7.3* 7.6*  MG 2.1  --   --   --    Sepsis Markers  Recent Labs Lab 07/29/13 0520 07/29/13 1215 07/31/13 0425 07/31/13 1100 07/31/13 1700 08/02/13 0500  LATICACIDVEN 3.5*  --   --  1.6 1.5  --   PROCALCITON  --  54.96 48.97  --   --  21.76   ABG  Recent Labs Lab 07/31/13 1617 07/31/13 1851 08/02/13 0825  PHART 7.245* 7.355 7.449   PCO2ART 57.3* 43.2 43.7  PO2ART 131.0* 107.0* 135.0*   Liver Enzymes  Recent Labs Lab 07/29/13 0300 07/30/13 0446 07/31/13 0425  AST 39* 50* 69*  ALT 31 39 39  ALKPHOS 40 48 87  BILITOT 1.5* 1.7* 2.2*  ALBUMIN 2.0* 1.6* 1.4*   Cardiac Enzymes  Recent Labs Lab 07/28/13 1911 07/31/13 0425 07/31/13 0915 07/31/13 1625  TROPONINI <0.30 9.33* >20.00* >20.00*  PROBNP 500.6*  --   --   --    Glucose  Recent Labs Lab 08/02/13 0508 08/02/13 0741 08/02/13 1202 08/02/13 1951 08/02/13 2342 08/03/13 0332  GLUCAP 123* 139* 158* 115* 94 90   CXR: 1/4 - improvement in RLL opacity  ASSESSMENT / PLAN:  PULMONARY A:  Acute respiratory failure in setting of sepsis / AMI - extubated 1/3 Acute pulmonary edema. - improving Aspiration pneumonia - while possible, we believe this is unlikely P:   -continue antibiotics for probable aspiration PNA -pt breathing well off vent -attempt to wean off O2, keep O2 sats > 92  CARDIOVASCULAR A:  Septic / cardiogenic shock. - CVP 12 today STEMI. P:  -Cardiology following, appreciate recs -heparin d/c'ed today -continue ASA, Lipitor -BB/ACEI contraindicated shock    RENAL A:   AKI - slowly improving Doubt significant rhabdomyolysis as normal transaminases / CK decreasing. Total body fluid overload (stay balance pos 18L). Bilateral renal calculi. P:   -lasix d/c'ed yesterday, CVP = 12 today -Renal following, greatly appreciate recs -Trend BMP  GASTROINTESTINAL A: Nutrition. GI Px. P:   -swallow study today, then advance diet as tolerated -d/c protonix once taking PO  HEMATOLOGIC A:   Thrombocytopenia, likely sepsis related. Anticoagulation for ACS. VTE Px is not indicated. Bleeding from L IJ CVL P: -bleeding from L IJ CVL currently controlled with pressure dressing, and should improve after discontinuation of Heparin drip -Trend CBC -start subcutaneous heparin for DVT ppx  INFECTIOUS A:   Abdominal wall  cellulitis, myositis and group A strep bacteremia. Aspiration pneumonia. P:   -ID consulted, appreciate recs -ABX as above -Cultures as above  ENDOCRINE A:  Hyperglycemia, likely stress related. P:   -SSI  NEUROLOGIC A: Pain. P:   -d/c fentanyl, start oxy IR prn for pain  Signed, Janalyn Harderyan Brown, PGY3 Pgr. [phone] 08/03/2013, 6:46 AM  Attending:  I have seen and examined the patient with nurse practitioner/resident and agree with the note above.   [facility] off  [facility], [facility] [place] PCCM Pager: [phone] Cell: [phone] If no response, call [phone]

## 2013-08-04 DIAGNOSIS — R7989 Other specified abnormal findings of blood chemistry: Secondary | ICD-10-CM

## 2013-08-04 LAB — RENAL FUNCTION PANEL
Albumin: 1.6 g/dL — ABNORMAL LOW (ref 3.5–5.2)
BUN: 85 mg/dL — ABNORMAL HIGH (ref 6–23)
CO2: 29 meq/L (ref 19–32)
CREATININE: 4.27 mg/dL — AB (ref 0.50–1.35)
Calcium: 7.8 mg/dL — ABNORMAL LOW (ref 8.4–10.5)
Chloride: 98 mEq/L (ref 96–112)
GFR calc non Af Amer: 16 mL/min — ABNORMAL LOW (ref >90)
GFR, EST AFRICAN AMERICAN: 18 mL/min — AB (ref >90)
Glucose, Bld: 89 mg/dL (ref 70–99)
Phosphorus: 4.1 mg/dL (ref 2.3–4.6)
Potassium: 3.4 mEq/L — ABNORMAL LOW (ref 3.7–5.3)
SODIUM: 140 meq/L (ref 137–147)

## 2013-08-04 LAB — CBC
HEMATOCRIT: 30.6 % — AB (ref 39.0–52.0)
Hemoglobin: 10.9 g/dL — ABNORMAL LOW (ref 13.0–17.0)
MCH: 30.2 pg (ref 26.0–34.0)
MCHC: 35.6 g/dL (ref 30.0–36.0)
MCV: 84.8 fL (ref 78.0–100.0)
Platelets: 157 10*3/uL (ref 150–400)
RBC: 3.61 MIL/uL — AB (ref 4.22–5.81)
RDW: 13.6 % (ref 11.5–15.5)
WBC: 21.4 10*3/uL — ABNORMAL HIGH (ref 4.0–10.5)

## 2013-08-04 LAB — GLUCOSE, CAPILLARY: Glucose-Capillary: 39 mg/dL — CL (ref 70–99)

## 2013-08-04 LAB — MYOGLOBIN, URINE: Myoglobin, Ur: 663 mcg/L — ABNORMAL HIGH (ref <28)

## 2013-08-04 MED ORDER — POTASSIUM CHLORIDE CRYS ER 20 MEQ PO TBCR
40.0000 meq | EXTENDED_RELEASE_TABLET | Freq: Once | ORAL | Status: AC
  Administered 2013-08-04: 40 meq via ORAL
  Filled 2013-08-04: qty 2

## 2013-08-04 MED ORDER — PENICILLIN G POTASSIUM 5000000 UNITS IJ SOLR
4.0000 10*6.[IU] | Freq: Four times a day (QID) | INTRAVENOUS | Status: DC
  Administered 2013-08-04 – 2013-08-07 (×12): 4 10*6.[IU] via INTRAVENOUS
  Filled 2013-08-04 (×21): qty 4

## 2013-08-04 MED ORDER — ZOLPIDEM TARTRATE 5 MG PO TABS
5.0000 mg | ORAL_TABLET | Freq: Every evening | ORAL | Status: DC | PRN
  Administered 2013-08-04: 5 mg via ORAL
  Filled 2013-08-04: qty 1

## 2013-08-04 MED ORDER — PENICILLIN G POTASSIUM 5000000 UNITS IJ SOLR: 4.0000 10*6.[IU] | INTRAVENOUS | Status: DC

## 2013-08-04 NOTE — Progress Notes (Addendum)
[facility]    Date of Admission:  07/28/2013   Total days of antibiotics 8        Samudio 6 clinda        Roker 6 piptazo (previously on PenG)           ID: Andrew Hogan is a 43 y.o. male with  ARDS, AKI & STEMI 2/2 sepsis due to Group A strep Bacteremia and myositis concerning for necrotizing fasciitis  Principal Problem:   ARDS (adult respiratory distress syndrome) Active Problems:   Chest wall pain   Cellulitis   Acute renal failure   Diarrhea   Bacteremia due to Gram-positive bacteria   Severe sepsis(995.92)   Dehydration   Metabolic acidosis   Hypokalemia   Rhabdomyolysis   Fatty infiltration of liver   Acute respiratory failure with hypoxia   Septic shock(785.52)    Subjective: Remains afebrile. He is feeling much better. Good appetite. He feels that he has Andrew Hogan/night reversal, would like sleepig pill. Continues to improve, less weeping is occuring from affected right chest wall/flank/upper thigh. Good urine output  Leukocytosis peaked at 30, now down to 21.cr down to 4.27  Medications:  . aspirin  81 mg Oral Daily  . atorvastatin  20 mg Oral q1800  . heparin subcutaneous  5,000 Units Subcutaneous Q8H  . lidocaine  2 patch Transdermal Q24H  . piperacillin-tazobactam (ZOSYN)  IV  3.375 g Intravenous Q8H  . sodium chloride  3 mL Intravenous Q12H    Objective: Vital signs in last 24 hours: Temp:  [97.6 F (36.4 C)-98.6 F (37 C)] 98.6 F (37 C) (01/05 0800) Pulse Rate:  [82-107] 88 (01/05 0800) Resp:  [17-24] 24 (01/05 0800) BP: (99-131)/(54-67) 131/62 mmHg (01/05 0800) SpO2:  [93 %-100 %] 98 % (01/05 0800) Weight:  [228 lb 2.8 oz (103.5 kg)] 228 lb 2.8 oz (103.5 kg) (01/05 0500) Physical Exam  Constitutional: alert, soft spoken, "I feel much better". HENT:  Mouth/Throat: Oropharynx is clear and moist. No oropharyngeal exudate.  Cardiovascular: Normal rate, regular rhythm and normal heart sounds. Exam reveals no gallop and no friction  rub.  No murmur heard.  Pulmonary/Chest: Effort normal and breath sounds normal. No respiratory distress. He has no wheezes.  Abdominal: Soft. Bowel sounds are normal. He exhibits no distension. There is no tenderness.  Lymphadenopathy: no cervical adenopathy.  Skin: erythema to chest wall Ext: +1-2 edema on legs,arms c/w anasarca     Lab Results  Recent Labs  08/03/13 0530 08/04/13 0432  WBC 30.2* 21.4*  HGB 11.6* 10.9*  HCT 32.0* 30.6*  NA 136* 140  K 3.5* 3.4*  CL 93* 98  CO2 28 29  BUN 93* 85*  CREATININE 5.18* 4.27*   Liver Panel  Recent Labs  08/04/13 0432  ALBUMIN 1.6*    Microbiology: 12/29  Blood cx x 2: Group A strep Studies/Results: Dg Chest Port 1 View  08/03/2013   CLINICAL DATA:  Extubation.  EXAM: PORTABLE CHEST - 1 VIEW  COMPARISON:  Portable chest 08/02/2013.  CT 07/31/2013.  FINDINGS: 0512 hr. Interval extubation with removal of the nasogastric tube. The left IJ central venous catheter is unchanged at the lower SVC level. There is improved aeration of the lungs with resolution of edema. Minimal left lower lobe atelectasis remains. There is no significant pleural effusion or pneumothorax. Mild cardiomegaly is stable.  IMPRESSION: Significantly improved pulmonary aeration with resolution of edema following extubation. No acute findings demonstrated.   Electronically  Signed   By: Roxy HorsemanBill  Veazey M.D.   On: 08/03/2013 07:58     Assessment/Plan: Group A strep myositis/celllulitis and bacteremia presenting with toxic shock = clinically much improved over the last 3 days since being off of pressors, and extubated. Will discontinue clindamycin today. Also change back to penicillin G 4MU Q 6hr IV. For discharge and a change to an oral equivalent.  Leukocytosis =  Continue with daily cbc. Leukocytosis improved today  AKI= appears improved. Continue with daily bmp  Sepsis management = continues on abtx for group A strep. Will narrow back to PCN since he is  improve.  STEMI = resolved on telemetry, no Q wave on ECG. Etiology likely due to demand ischemia from septic shock. Per cardiology recs, heparin drip to continue today.  Insomnia= will give prn Remus Lofflerambien, for sleep   Drue SecondSNIDER, [facility] Cell: [phone] Pager: [phone]  08/04/2013, 10:29 AM

## 2013-08-04 NOTE — Evaluation (Signed)
Physical Therapy Evaluation Patient Details Name: Andrew Hogan MRN: [id number] DOB: 02/04/1971 Today's Date: 08/04/2013 Time: [id number] PT Time Calculation (min): 14 min  PT Assessment / Plan / Recommendation History of Present Illness  Andrew Hogan is a 43 y.o. male admitted with Rt side pain/cellulitis.  Patient with  ARDS, AKI & STEMI 2/2 sepsis due to Group A strep Bacteremia and myositis concerning for necrotizing fasciitis.  Was intubated on 1/1 and extubated on 1/3.  Clinical Impression  Patient presents with problems listed below.  Will benefit from acute PT to maximize functional independence at discharge.  Anticipate patient will progress well with PT.  Encouraged patient to ask to get OOB and ambulate in hallway with nursing.    PT Assessment  Patient needs continued PT services    Follow Up Recommendations  No PT follow up;Supervision - Intermittent    Does the patient have the potential to tolerate intense rehabilitation      Barriers to Discharge        Equipment Recommendations  None recommended by PT    Recommendations for Other Services     Frequency Min 3X/week    Precautions / Restrictions Precautions Precautions: Fall Restrictions Weight Bearing Restrictions: No   Pertinent Vitals/Pain       Mobility  Bed Mobility Bed Mobility: Supine to Sit;Sit to Supine Supine to Sit: 5: Supervision;With rails;HOB elevated Sit to Supine: 5: Supervision;With rail;HOB elevated Details for Bed Mobility Assistance: Supervision for safety only. Transfers Transfers: Sit to Stand;Stand to Sit Sit to Stand: 4: Min assist;With upper extremity assist;From bed Stand to Sit: 4: Min guard;With upper extremity assist;To bed Details for Transfer Assistance: Verbal cues for technique and safety.  Assist for balance/safety during transfers.  Initially unsteady in standing - improved with time. Ambulation/Gait Ambulation/Gait Assistance: 4: Min assist Ambulation Distance (Feet): 180  Feet Assistive device: None Ambulation/Gait Assistance Details: Patient with slow guarded gait.  Slightly unsteady initially.  Fatigued at end of gait.  Gait Pattern: Step-through pattern;Decreased step length - right;Decreased step length - left;Decreased stride length Gait velocity: Slow gait speed General Gait Details: HR increased to 98 bpm and O2 sat remained at 100%.    Exercises     PT Diagnosis: Abnormality of gait;Generalized weakness  PT Problem List: Decreased strength;Decreased activity tolerance;Decreased balance;Decreased mobility;Cardiopulmonary status limiting activity PT Treatment Interventions: Gait training;Functional mobility training;Therapeutic exercise;Balance training;Patient/family education     PT Goals(Current goals can be found in the care plan section) Acute Rehab PT Goals Patient Stated Goal: to get stronger PT Goal Formulation: With patient Time For Goal Achievement: 08/11/13 Potential to Achieve Goals: Good  Visit Information  Last PT Received On: 08/04/13 Assistance Needed: +1 History of Present Illness: Andrew Hogan is a 43 y.o. male admitted with Rt side pain/cellulitis.  Patient with  ARDS, AKI & STEMI 2/2 sepsis due to Group A strep Bacteremia and myositis concerning for necrotizing fasciitis.  Was intubated on 1/1 and extubated on 1/3.       Prior Functioning  Home Living Family/patient expects to be discharged to:: Private residence Living Arrangements: Spouse/significant other;Children Available Help at Discharge: Family;Available PRN/intermittently Type of Home: House Home Access: Stairs to enter Entergy CorporationEntrance Stairs-Number of Steps: 1 Entrance Stairs-Rails: None Home Layout: Two level;Able to live on main level with bedroom/bathroom Home Equipment: Dan HumphreysWalker - 2 wheels Prior Function Level of Independence: Independent Comments: Patient works 4 hours/[employer] from home. Communication Communication: No difficulties    Cognition   Cognition Arousal/Alertness: Awake/alert Behavior During Therapy:  WFL for tasks assessed/performed Overall Cognitive Status: Within Functional Limits for tasks assessed    Extremity/Trunk Assessment Upper Extremity Assessment Upper Extremity Assessment: Overall WFL for tasks assessed Lower Extremity Assessment Lower Extremity Assessment: Generalized weakness   Balance Balance Balance Assessed: Yes High Level Balance High Level Balance Activites: Turns;Sudden stops;Head turns High Level Balance Comments: Decreased balance with high level balance activities.  End of Session PT - End of Session Equipment Utilized During Treatment: Gait belt Activity Tolerance: Patient limited by fatigue Patient left: in bed;with call bell/phone within reach Nurse Communication: Mobility status (Encouraged OOB and ambulation in hallway with nursing)  GP     Vena Austria 08/04/2013, 2:41 PM Durenda Hurt. Renaldo Fiddler, [facility] Acute Rehab Services Pager [phone]

## 2013-08-04 NOTE — Progress Notes (Signed)
Patient ID: [name], male   DOB: 06/25/1971, 43 y.o.   MRN: [id number]    Subjective: Pt feels ok.  No complaints.  No pain  Objective: Vital signs in last 24 hours: Temp:  [97.6 F (36.4 C)] 97.6 F (36.4 C) (01/04 1600) Pulse Rate:  [80-107] 86 (01/05 0600) Resp:  [14-23] 21 (01/05 0600) BP: (99-115)/(54-67) 110/59 mmHg (01/05 0600) SpO2:  [93 %-100 %] 93 % (01/05 0600) Weight:  [228 lb 2.8 oz (103.5 kg)] 228 lb 2.8 oz (103.5 kg) (01/05 0500) Last BM Date: 08/02/13  Intake/Output from previous [place]: 01/04 0701 - 01/05 0700 In: 898 [P.O.:700; I.V.:48; IV Piggyback:150] Out: 3625 [Urine:3625] Intake/Output this shift:    PE: Skin: no further erythema noted of chest wall or flank.  Bullae are covered with mepilex boarder.  Induration is less.  No pain  Lab Results:   Recent Labs  08/03/13 0530 08/04/13 0432  WBC 30.2* 21.4*  HGB 11.6* 10.9*  HCT 32.0* 30.6*  PLT 130* 157   BMET  Recent Labs  08/03/13 0530 08/04/13 0432  NA 136* 140  K 3.5* 3.4*  CL 93* 98  CO2 28 29  GLUCOSE 90 89  BUN 93* 85*  CREATININE 5.18* 4.27*  CALCIUM 7.6* 7.8*   PT/INR No results found for this basename: LABPROT, INR,  in the last 72 hours CMP     Component Value Date/Time   NA 140 08/04/2013 0432   K 3.4* 08/04/2013 0432   CL 98 08/04/2013 0432   CO2 29 08/04/2013 0432   GLUCOSE 89 08/04/2013 0432   BUN 85* 08/04/2013 0432   CREATININE 4.27* 08/04/2013 0432   CALCIUM 7.8* 08/04/2013 0432   PROT 5.1* 07/31/2013 0425   ALBUMIN 1.6* 08/04/2013 0432   AST 69* 07/31/2013 0425   ALT 39 07/31/2013 0425   ALKPHOS 87 07/31/2013 0425   BILITOT 2.2* 07/31/2013 0425   GFRNONAA 16* 08/04/2013 0432   GFRAA 18* 08/04/2013 0432   Lipase     Component Value Date/Time   LIPASE 9* 07/29/2013 0300       Studies/Results: Dg Chest Port 1 View  08/03/2013   CLINICAL DATA:  Extubation.  EXAM: PORTABLE CHEST - 1 VIEW  COMPARISON:  Portable chest 08/02/2013.  CT 07/31/2013.  FINDINGS: 0512 hr. Interval extubation with  removal of the nasogastric tube. The left IJ central venous catheter is unchanged at the lower SVC level. There is improved aeration of the lungs with resolution of edema. Minimal left lower lobe atelectasis remains. There is no significant pleural effusion or pneumothorax. Mild cardiomegaly is stable.  IMPRESSION: Significantly improved pulmonary aeration with resolution of edema following extubation. No acute findings demonstrated.   Electronically Signed   By: Roxy HorsemanBill  Veazey M.D.   On: 08/03/2013 07:58    Anti-infectives: Anti-infectives   Start     Dose/Rate Route Frequency Ordered Stop   08/03/13 2200  piperacillin-tazobactam (ZOSYN) IVPB 3.375 g     3.375 g 12.5 mL/hr over 240 Minutes Intravenous 3 times per Nunn 08/03/13 1219     08/03/13 1800  piperacillin-tazobactam (ZOSYN) IVPB 3.375 g  Status:  Discontinued     3.375 g 12.5 mL/hr over 240 Minutes Intravenous 4 times per Falero 08/03/13 1213 08/03/13 1219   08/01/13 1300  piperacillin-tazobactam (ZOSYN) IVPB 2.25 g  Status:  Discontinued     2.25 g 100 mL/hr over 30 Minutes Intravenous 4 times per Mahn 08/01/13 1123 08/03/13 1213   07/31/13 0600  vancomycin (VANCOCIN) IVPB  1000 mg/200 mL premix  Status:  Discontinued     1,000 mg 200 mL/hr over 60 Minutes Intravenous Every 48 hours 07/29/13 0305 07/30/13 1109   07/30/13 2200  penicillin G potassium 4 Million Units in dextrose 5 % 250 mL IVPB  Status:  Discontinued     4 Million Units 250 mL/hr over 60 Minutes Intravenous 3 times per Hartwell 07/30/13 1258 08/01/13 0921   07/30/13 1400  clindamycin (CLEOCIN) IVPB 600 mg    Comments:  Pharmacy may adjust antiboitics as needed; GAS, ? necrotizing   600 mg 100 mL/hr over 30 Minutes Intravenous 3 times per Much 07/30/13 1109     07/30/13 1200  penicillin G potassium 4 Million Units in dextrose 5 % 250 mL IVPB    Comments:  Group A strep   4 Million Units 250 mL/hr over 60 Minutes Intravenous 6 times per Sitts 07/30/13 1109 07/30/13 1516   07/29/13  0400  vancomycin (VANCOCIN) IVPB 1000 mg/200 mL premix     1,000 mg 200 mL/hr over 60 Minutes Intravenous  Once 07/29/13 0305 07/29/13 0752   07/29/13 0400  piperacillin-tazobactam (ZOSYN) IVPB 3.375 g  Status:  Discontinued     3.375 g 12.5 mL/hr over 240 Minutes Intravenous 3 times per B[place]ier 07/29/13 0305 07/30/13 1109   07/28/13 1945  vancomycin (VANCOCIN) IVPB 1000 mg/200 mL premix     1,000 mg 200 mL/hr over 60 Minutes Intravenous  Once 07/28/13 1934 07/28/13 2105   07/28/13 1945  piperacillin-tazobactam (ZOSYN) IVPB 3.375 g     3.375 g 12.5 mL/hr over 240 Minutes Intravenous  Once 07/28/13 1934 07/28/13 2033       Assessment/Plan  1. Right chest wall, flank, and groin cellulitis 2. MI 3. ARF 4. Leukocytosis, improving  Plan: 1. Area on chest and flank appears much better and resolving.  WBC down today.  It does not appear patient will need any surgery to this area.  No further surgical indications.  We will sign off.   LOS: 7 days    Tenesia Escudero E 08/04/2013, 8:12 AM Pager: [phone]

## 2013-08-04 NOTE — Progress Notes (Signed)
S: Feeling better.  Some diarrhea yest O:BP 110/59  Pulse 86  Temp(Src) 97.6 F (36.4 C) (Oral)  Resp 21  Ht 5\' 11"  (1.803 m)  Wt 103.5 kg (228 lb 2.8 oz)  BMI 31.84 kg/m2  SpO2 93%  Intake/Output Summary (Last 24 hours) at 08/04/13 0703 Last data filed at 08/04/13 0600  Gross per 24 hour  Intake    898 ml  Output   3625 ml  Net  -2727 ml   Weight change:  ZOX:WRUEAGen:awake and alert CVS:RRR Resp:Bibasilar crackles Abd:+ BS NTND.  Bandages Rt flank Ext: trace edema NEURO:CNI Ox3 no asterixis   . aspirin  81 mg Oral Daily  . atorvastatin  20 mg Oral q1800  . chlorhexidine  15 mL Mouth/Throat BID  . clindamycin (CLEOCIN) IV  600 mg Intravenous Q8H  . feeding supplement (PRO-STAT SUGAR FREE 64)  30 mL Per Tube BID  . heparin subcutaneous  5,000 Units Subcutaneous Q8H  . lidocaine  2 patch Transdermal Q24H  . piperacillin-tazobactam (ZOSYN)  IV  3.375 g Intravenous Q8H  . sodium chloride  3 mL Intravenous Q12H   Dg Chest Port 1 View  08/03/2013   CLINICAL DATA:  Extubation.  EXAM: PORTABLE CHEST - 1 VIEW  COMPARISON:  Portable chest 08/02/2013.  CT 07/31/2013.  FINDINGS: 0512 hr. Interval extubation with removal of the nasogastric tube. The left IJ central venous catheter is unchanged at the lower SVC level. There is improved aeration of the lungs with resolution of edema. Minimal left lower lobe atelectasis remains. There is no significant pleural effusion or pneumothorax. Mild cardiomegaly is stable.  IMPRESSION: Significantly improved pulmonary aeration with resolution of edema following extubation. No acute findings demonstrated.   Electronically Signed   By: Roxy HorsemanBill  Veazey M.D.   On: 08/03/2013 07:58   BMET    Component Value Date/Time   NA 140 08/04/2013 0432   K 3.4* 08/04/2013 0432   CL 98 08/04/2013 0432   CO2 29 08/04/2013 0432   GLUCOSE 89 08/04/2013 0432   BUN 85* 08/04/2013 0432   CREATININE 4.27* 08/04/2013 0432   CALCIUM 7.8* 08/04/2013 0432   GFRNONAA 16* 08/04/2013 0432   GFRAA  18* 08/04/2013 0432   CBC    Component Value Date/Time   WBC 21.4* 08/04/2013 0432   RBC 3.61* 08/04/2013 0432   HGB 10.9* 08/04/2013 0432   HCT 30.6* 08/04/2013 0432   PLT 157 08/04/2013 0432   MCV 84.8 08/04/2013 0432   MCH 30.2 08/04/2013 0432   MCHC 35.6 08/04/2013 0432   RDW 13.6 08/04/2013 0432   LYMPHSABS 2.1 08/03/2013 0530   MONOABS 0.9 08/03/2013 0530   EOSABS 0.0 08/03/2013 0530   BASOSABS 0.6* 08/03/2013 0530     Assessment: 1. AKI presumably due to ATN in setting of sepsis, improving with good UO 2. Gr A strep sepsis 3. Hypokalemia  Plan: 1. Replace K 2. DC foley and cont to keep I/O 3. Daily SCr   Romuald Mccaslin T

## 2013-08-04 NOTE — Progress Notes (Signed)
I have seen and examined the pt and agree with PA-Osborne's progress note.  

## 2013-08-04 NOTE — Progress Notes (Signed)
NUTRITION FOLLOW UP  Intervention:   1.  General healthful diet; encourage intake of foods and beverages as able.  RD to follow and assess for nutritional adequacy.   Nutrition Dx:   Inadequate oral intake related to inability to eat as evidenced by NPO status.   Goal:  Patient will meet >/=90% of estimated nutrition needs   Monitor:  TF advancement and tolerance, respiratory status, weight, labs  Assessment:   Patient admitted with cellulitis, chest pain, and acute kidney injury. He was having 10-15 episodes of loose bowel movements for 3 days prior to admission. During admission, he developed a STEMI on 1/1.  Now extubated with improved status.  Diet has been advanced to Regular and he is eating 100% of meals.   RD met with pt who endorses trying to lose wt recently.  Pt states that he "cannot stop eating at home" and is generally dissatisfied with his inability to control wt or appetite.  He states he has tried several diets recently- juice cleanses, and at one point stopped eating all together, but he has not been able to sustain these diets as he has continued to be hungry.  He asks about a vegan diet for wt loss.  RD discussed pt's current nutrition-related goals.  Encouraged wt maintenance until he has healed and is feeling better at home.  RD also discussed pt's recent attempts at weight loss and reasons they may not be working.  He does not endorse taking any diet pills or supplements.   RD notes pt without medical hx, now with trouble with glucose control in setting of sepsis.  Question whether pt with possible diabetes which may be affecting appetite, although recent diet choices are likely largely responsible. His blood sugars have returned to WNL as of this AM.   Height: Ht Readings from Last 1 Encounters:  07/28/13 _0  (1.803 m)    Weight Status:   Wt Readings from Last 1 Encounters:  08/04/13 228 lb 2.8 oz (103.5 kg)    Re-estimated needs:  Kcal:  2380-2500 Protein: 105-125g Fluid: >2.3 L/Rog or per MD  Skin: cellulitis to chest wall, flank, and groin  Diet Order: General   Intake/Output Summary (Last 24 hours) at 08/04/13 0929 Last data filed at 08/04/13 0800  Gross per 24 hour  Intake    900 ml  Output   3675 ml  Net  -2775 ml    Last BM: 1/3  Labs:   Recent Labs Lab 07/31/13 0425 08/01/13 0440  08/02/13 0500 08/03/13 0530 08/04/13 0432  NA 131* 130*  < > 133* 136* 140  K 3.2* 2.9*  < > 3.0* 3.5* 3.4*  CL 87* 88*  < > 90* 93* 98  CO2 21 23  < > _1 BUN 83* 85*  < > 88* 93* 85*  CREATININE 5.89* 5.74*  < > 5.47* 5.18* 4.27*  CALCIUM 7.0* 6.5*  < > 7.3* 7.6* 7.8*  MG  --  2.1  --   --   --   --   PHOS  --   --   --   --   --  4.1  GLUCOSE 113* 149*  < > 132* 90 89  < > = values in this interval not displayed.  CBG (last 3)   Recent Labs  08/02/13 1951 08/02/13 2342 08/03/13 0332  GLUCAP 115* 94 90    Scheduled Meds: . aspirin  81 mg Oral Daily  . atorvastatin  20  mg Oral q1800  . chlorhexidine  15 mL Mouth/Throat BID  . clindamycin (CLEOCIN) IV  600 mg Intravenous Q8H  . feeding supplement (PRO-STAT SUGAR FREE 64)  30 mL Per Tube BID  . heparin subcutaneous  5,000 Units Subcutaneous Q8H  . lidocaine  2 patch Transdermal Q24H  . piperacillin-tazobactam (ZOSYN)  IV  3.375 g Intravenous Q8H  . potassium chloride  40 mEq Oral Once  . sodium chloride  3 mL Intravenous Q12H    Continuous Infusions: . sodium chloride 20 mL/hr at 08/03/13 0700  . norepinephrine (LEVOPHED) Adult infusion Stopped (08/02/13 0800)    Brynda Greathouse, MS RD LDN Clinical Inpatient Dietitian Pager: [phone] Weekend/After hours pager: [phone]

## 2013-08-04 NOTE — Progress Notes (Addendum)
Patient ID: [name], male   DOB: 02/28/1971, 43 y.o.   MRN: [id number]   History: [age] yo admitted 12/29 with myositis and cellulitis of his abdominal wall.   Developed spetic shock, acute renal failure, Troponin level increased to > 20 and we were consulted.   Echo shows normal LV systolic function.    He was intubated, required pressors.  He has now been extubated and is doing much better.    SUBJECTIVE:  Feeling better  . aspirin  81 mg Oral Daily  . atorvastatin  20 mg Oral q1800  . chlorhexidine  15 mL Mouth/Throat BID  . clindamycin (CLEOCIN) IV  600 mg Intravenous Q8H  . feeding supplement (PRO-STAT SUGAR FREE 64)  30 mL Per Tube BID  . heparin subcutaneous  5,000 Units Subcutaneous Q8H  . lidocaine  2 patch Transdermal Q24H  . piperacillin-tazobactam (ZOSYN)  IV  3.375 g Intravenous Q8H  . potassium chloride  40 mEq Oral Once  . sodium chloride  3 mL Intravenous Q12H  Heparin gtt    Filed Vitals:   08/04/13 0000 08/04/13 0400 08/04/13 0500 08/04/13 0600  BP: 112/60 115/64  110/59  Pulse: 96 90  86  Temp:      TempSrc:      Resp: 20 19  21   Height:      Weight:   228 lb 2.8 oz (103.5 kg)   SpO2: 97% 94%  93%    Intake/Output Summary (Last 24 hours) at 08/04/13 0813 Last data filed at 08/04/13 0600  Gross per 24 hour  Intake    850 ml  Output   3225 ml  Net  -2375 ml    LABS: Basic Metabolic Panel:  Recent Labs  52/84/1299/11/12 0530 08/04/13 0432  NA 136* 140  K 3.5* 3.4*  CL 93* 98  CO2 28 29  GLUCOSE 90 89  BUN 93* 85*  CREATININE 5.18* 4.27*  CALCIUM 7.6* 7.8*  PHOS  --  4.1   Liver Function Tests:  Recent Labs  08/04/13 0432  ALBUMIN 1.6*   No results found for this basename: LIPASE, AMYLASE,  in the last 72 hours CBC:  Recent Labs  08/02/13 0500 08/03/13 0530 08/04/13 0432  WBC 30.0* 30.2* 21.4*  NEUTROABS 27.8* 26.6*  --   HGB 12.5* 11.6* 10.9*  HCT 34.1* 32.0* 30.6*  MCV 82.8 84.2 84.8  PLT 124* 130* 157   Cardiac Enzymes: No  results found for this basename: CKTOTAL, CKMB, CKMBINDEX, TROPONINI,  in the last 72 hours BNP: No components found with this basename: POCBNP,  D-Dimer: No results found for this basename: DDIMER,  in the last 72 hours Hemoglobin A1C: No results found for this basename: HGBA1C,  in the last 72 hours Fasting Lipid Panel: No results found for this basename: CHOL, HDL, LDLCALC, TRIG, CHOLHDL, LDLDIRECT,  in the last 72 hours Thyroid Function Tests: No results found for this basename: TSH, T4TOTAL, FREET3, T3FREE, THYROIDAB,  in the last 72 hours Anemia Panel: No results found for this basename: VITAMINB12, FOLATE, FERRITIN, TIBC, IRON, RETICCTPCT,  in the last 72 hours  RADIOLOGY:   PHYSICAL EXAM BP 110/59  Pulse 86  Temp(Src) 97.6 F (36.4 C) (Oral)  Resp 21  Ht 5\' 11"  (1.803 m)  Wt 228 lb 2.8 oz (103.5 kg)  BMI 31.84 kg/m2  SpO2 93%  General: NAD  Neck: JVP 8 cm Lungs: lungs are mostly clear.  Few basilar rales.  CV: Nondisplaced PMI.  Heart regular S1/S2,  no S3/S4, no murmur.  No leg edema Abdomen: Soft,  no hepatosplenomegaly, no distention.  Extremities: No clubbing or cyanosis.  Skin: Erythma and blistering on right flank.  bandaged  TELEMETRY: Reviewed telemetry pt in NSR  ASSESSMENT AND PLAN: 43 yo with minimal PMH presented with extensive right chest wall and abdominal wall cellulitis/myositis with Group A strep bacteremia. On 1/1, he developed hypotension and ECG changes and was intubated and started on pressors.  Extubated on 1/3 and norepinphrine turned off.  1. ID: Group A strep bacteremia with extensive cellulitis/myositis right chest wall/abdominal wall. Septic shock resolved, off norepinephrine. 2. AKI: Suspect this is due to septic shock + extensive NSAID use at home. Good UOP, suspect post-ATN diuresis.  Creatinine starting to come down. 3. Troponin elevation:   Elevated TnI and ST elevation in the setting of septic shock. No chest pain. Patient does not  have risk factors for CAD. Echo done 1/1 showed normal EF and no wall motion abnormalities.  ST elevation resolved with improvement in BP on norepinephrine and ECG 1/2 showed no STE and no Q waves.  The elevation in troponin may all represent demand ischemia with potential septic cardiomyopathy.   I do not think that he needs any further testing for cardiac ischemia.   His troponin increase was in the setting of septic shock, hypotension.  No prevjious episodes of CP .  Will sign off.  Call for questions.    Elyn Aquas. 08/04/2013 8:13 AM

## 2013-08-04 NOTE — Progress Notes (Signed)
[facility] Progress Note [place] TEAM 1 - Stepdown ICU Team   Andrew MannersWen Hogan ZOX:[id number] DOB: 10/29/1970 DOA: 07/28/2013 PCP: No primary provider on file.  Brief narrative: 43 year old otherwise healthy male. Presented to the ER for progressive right-sided chest and abdominal wall pain ongoing since the previous Wednesday. Patient endorsed on that same date he lifted a heavy box and developed this significant pain. The pain was easily treated initially with ibuprofen and left over hydrocodone from a prior prescription. On the date of admission the pain had increased significantly over a less than 24-hour period and despite utilization of hot compresses and multiple doses of over-the-counter ibuprofen he sought treatment in the emergency department. In addition to the symptoms he had had multiple episodes of loose watery bowel movements without blood over the past 3 days a total of 10-12 stools per [place]. He denied any bowel movements on the date of presentation. In addition he has noticed a rash beginning under his left axilla and was itching and thought he was developing a rash in the periumbilical region. A limited echocardiogram was performed in the ER and showed no gross obvious abnormalities. There were concerns initially the patient may be experiencing pulmonary embolus. It was important to note that at presentation patient had tachypnea, was afebrile, was hypotensive with a blood pressure 75/34 and somewhat tachycardic. While in the emergency department he received a total of 3 L of normal saline and started on empiric antibiotic therapy. His blood pressure improved and his NAP was greater than 65 saline as deemed appropriate for non-ICU admission.  Since admission patient clarified to the RN that prior to onset of above symptoms he had noticed some knots or lymph nodes that were enlarged and tender in his axilla.  He developed hypotension, renal failure, and a STEMI on 1/1 in the early AM, and  [facility]  SIGNIFICANT EVENTS / STUDIES:  12/30 - CT chest/abdomen/pelvis > Extensive soft tissue and intramuscular cellulitis/infiltration and edema from left shoulder to pelvis, no discrete abscess identified  12/30 - TTE > EF 60-65%, grade 1 diastolic dysfunction  12/31 - Renal U/S > Normal size kidneys, no hydro, 6mm stone left lower pole  01/01 - TTE > EF 55-60%, no RWMA PAP 42  01/01 - CT chest/abdomen/pelvis > NO soft tissue gas - anasarca, pleural effusions, trace pericardial effusion, bilateral airspace disease (new RML), hepatic steatosis, bilateral nonobstructive calculi  01/03 - extubated, norepinephrine weaned off  OETT 1/1 >>> 1/3  OGT 1/1 >>> 1/3  L IJ CVL 1/1 >>  HPI/Subjective: Patient is alert and interactive.  He states he feels much better overall.  He is anxious to begin moving about and is worried that his appetite is poor at the present time.  Assessment/Plan:  Sepsis due to:   A) diffuse right side fasciitis / myositis / cellulitis with left infra-axilla cellulitis   B) Bacteremia due to strep A -2 of 2 blood cx's positive for strep A -CT chest and abdomen revealed extensive fasciitis on right side but findings not consistent with necrotizing fasciitis given lack of air on CT scan -ID following/directing abx selection -pt slowly improving after initial rapid decline   Acute respiratory failure in setting of sepsis / AMI  extubated 1/03 - no resp distress - no chronic pulmonary disease   Septic /cardiogenic shock w/ STEMI Felt to be due to demand ischemia - no further CAD eval planned at this time   Acute renal failure due  to ATN -Suspect etiology combination of dehydration from poor PO intake and severe diarrhea prior to admission, sepsis/bacterial toxins, and use of NSAIDs pre-admission   Thrombocytopenia likely sepsis related - now improving    Hypokalemia -Due to fluid shifting from dehydration and renal failure -replete  when necessary  Fatty infiltration of liver  -Likely a chronic issue and does not appear contributory to current symptoms  DVT prophylaxis: SQ heparin Code Status: Full Family Communication: discussed w/ pt at bedside Disposition Plan/Expected LOS: Stepdown   Consultants: General surgery Infectious Disease Nephrology TRH >> PCCM >> TRH  Procedures: 2-D echocardiogram - Left ventricle: The cavity size was normal. There was moderate concentric hypertrophy. Systolic function was normal. The estimated ejection fraction was in the range of 60% to 65%. Wall motion was normal; there were no regional wall motion abnormalities. Doppler parameters are consistent with abnormal left ventricular relaxation (grade 1 diastolic dysfunction). The E/e' ratio is <10, suggesting normal LV filling pressure. - Left atrium: The atrium was normal in size. - Inferior vena cava: The vessel was normal in size; the respirophasic diameter changes were in the normal range (= 50%); findings are consistent with normal central venous pressure. - Pericardium, extracardiac: A trivial pericardial effusion was identified posterior to the heart.  Antibiotics: Vancomycin 12/29 x1; 1/2 >>>  Zosyn 12/29 >>> 12/30; 1/2 >>>  Clindamycin 12/31 >>>  Penicillin G 12/31 >>> 1/2  Objective: Blood pressure 116/62, pulse 84, temperature 98.3 F (36.8 C), temperature source Oral, resp. rate 23, height 5\' 11"  (1.803 m), weight 103.5 kg (228 lb 2.8 oz), SpO2 100.00%.  Intake/Output Summary (Last 24 hours) at 08/04/13 1328 Last data filed at 08/04/13 1200  Gross per 24 hour  Intake   1150 ml  Output   3851 ml  Net  -2701 ml   Exam: General: no acute resp distress  Lungs: Clear to auscultation bilaterally without wheezes or crackles Cardiovascular: RRR w/o gallup or rub - no appreciable M Abdomen: Nontender nondistended, soft, bowel sounds present, no organomegaly Musculoskeletal: No significant cyanosis, clubbing  of bilateral lower extremities Neurological: Alert and oriented x 3, moves all extremities x 4 without focal neurological deficits, CN 2-12 intact Integumentary: No persisting erythema with superficial lateral wounds of right flank dressed with nonadherent dressing  Scheduled Meds:  Scheduled Meds: . aspirin  81 mg Oral Daily  . atorvastatin  20 mg Oral q1800  . heparin subcutaneous  5,000 Units Subcutaneous Q8H  . lidocaine  2 patch Transdermal Q24H  . pencillin G potassium IV  4 Million Units Intravenous Q6H  . sodium chloride  3 mL Intravenous Q12H   Data Reviewed: Basic Metabolic Panel:  Recent Labs Lab 07/31/13 0425 08/01/13 0440 08/01/13 1420 08/02/13 0500 08/03/13 0530 08/04/13 0432  NA 131* 130* 131* 133* 136* 140  K 3.2* 2.9* 3.4* 3.0* 3.5* 3.4*  CL 87* 88* 90* 90* 93* 98  CO2 21 23 25 26 28 29   GLUCOSE 113* 149* 112* 132* 90 89  BUN 83* 85* 88* 88* 93* 85*  CREATININE 5.89* 5.74* 5.67* 5.47* 5.18* 4.27*  CALCIUM 7.0* 6.5* 7.0* 7.3* 7.6* 7.8*  MG  --  2.1  --   --   --   --   PHOS  --   --   --   --   --  4.1   Liver Function Tests:  Recent Labs Lab 07/28/13 1911 07/29/13 0300 07/30/13 0446 07/31/13 0425 08/04/13 0432  AST 41* 39* 50* 69*  --  ALT 40 31 39 39  --   ALKPHOS 72 40 48 87  --   BILITOT 2.0* 1.5* 1.7* 2.2*  --   PROT 8.3 5.9* 5.0* 5.1*  --   ALBUMIN 3.2* 2.0* 1.6* 1.4* 1.6*    Recent Labs Lab 07/29/13 0300  LIPASE 9*  AMYLASE 12   CBC:  Recent Labs Lab 07/28/13 1911  07/29/13 0300  07/31/13 0425 08/01/13 0340 08/01/13 1517 08/02/13 0500 08/03/13 0530 08/04/13 0432  WBC 12.7*  < > 5.8  < > 25.6* 33.6*  --  30.0* 30.2* 21.4*  NEUTROABS 11.0*  --  5.1  --   --   --  28.9* 27.8* 26.6*  --   HGB 16.6  < > 15.9  < > 12.0* 12.8*  --  12.5* 11.6* 10.9*  HCT 46.8  < > 44.4  < > 32.9* 34.8*  --  34.1* 32.0* 30.6*  MCV 85.9  < > 86.2  < > 82.3 83.3  --  82.8 84.2 84.8  PLT 179  < > 133*  < > 108* 128*  --  124* 130* 157  < > =  values in this interval not displayed. Cardiac Enzymes:  Recent Labs Lab 07/28/13 1911 07/29/13 0300 07/30/13 0446 07/31/13 0425 07/31/13 0915 07/31/13 1625  CKTOTAL  --  1859* 1565* 903*  --   --   TROPONINI <0.30  --   --  9.33* >20.00* >20.00*   BNP (last 3 results)  Recent Labs  07/28/13 1911  PROBNP 500.6*   CBG:  Recent Labs Lab 08/02/13 0741 08/02/13 1202 08/02/13 1951 08/02/13 2342 08/03/13 0332  GLUCAP 139* 158* 115* 94 90    Recent Results (from the past 240 hour(s))  CULTURE, BLOOD (ROUTINE X 2)     Status: None   Collection Time    07/28/13  7:05 PM      Result Value Range Status   Specimen Description BLOOD RIGHT ANTECUBITAL   Final   Special Requests NONE BOTTLES DRAWN AEROBIC AND ANAEROBIC [facility] EACH   Final   Culture  Setup Time     Final   Value: 07/29/2013 01:25     Performed at [facility]   Culture     Final   Value: GROUP A STREP (S.PYOGENES) ISOLATED     Note: Gram Stain Report Called to,Read Back By and Verified With: Charlsie Merles 07/29/13 1405 BY SMITHERSJ     Performed at [facility]   Report Status PENDING   Incomplete  CULTURE, BLOOD (ROUTINE X 2)     Status: None   Collection Time    07/28/13  7:25 PM      Result Value Range Status   Specimen Description BLOOD LEFT ANTECUBITAL   Final   Special Requests NONE BOTTLES DRAWN AEROBIC AND ANAEROBIC 10CC EACH   Final   Culture  Setup Time     Final   Value: 07/29/2013 01:25     Performed at [facility]   Culture     Final   Value: GROUP A STREP (S.PYOGENES) ISOLATED     Note: Gram Stain Report Called to,Read Back By and Verified With: Charlsie Merles 07/29/13 1405 BY SMITHERSJ     Performed at [facility]   Report Status PENDING   Incomplete  URINE CULTURE     Status: None   Collection Time    07/29/13 12:43 AM      Result Value Range Status  Specimen Description URINE, RANDOM   Final   Special Requests NONE   Final   Culture  Setup Time      Final   Value: 07/29/2013 08:59     Performed at [facility]   Colony Count     Final   Value: NO GROWTH     Performed at [facility]   Culture     Final   Value: NO GROWTH     Performed at [facility]   Report Status 07/30/2013 FINAL   Final     Studies:  Recent x-ray studies have been reviewed in detail by the Attending Physician   Lonia Blood, MD [facility] Office  [phone] Pager [phone]  On-Call/Text Page:      [email or web address]      password [email or web address]  08/04/2013, 1:28 PM   LOS: 7 days

## 2013-08-05 ENCOUNTER — Inpatient Hospital Stay (HOSPITAL_COMMUNITY): Payer: BC Managed Care – PPO

## 2013-08-05 LAB — CBC
HCT: 31.3 % — ABNORMAL LOW (ref 39.0–52.0)
HEMOGLOBIN: 10.9 g/dL — AB (ref 13.0–17.0)
MCH: 30.3 pg (ref 26.0–34.0)
MCHC: 34.8 g/dL (ref 30.0–36.0)
MCV: 86.9 fL (ref 78.0–100.0)
PLATELETS: 201 10*3/uL (ref 150–400)
RBC: 3.6 MIL/uL — ABNORMAL LOW (ref 4.22–5.81)
RDW: 14.1 % (ref 11.5–15.5)
WBC: 23.8 10*3/uL — AB (ref 4.0–10.5)

## 2013-08-05 LAB — RENAL FUNCTION PANEL
ALBUMIN: 1.6 g/dL — AB (ref 3.5–5.2)
BUN: 63 mg/dL — AB (ref 6–23)
CHLORIDE: 95 meq/L — AB (ref 96–112)
CO2: 26 mEq/L (ref 19–32)
Calcium: 7.9 mg/dL — ABNORMAL LOW (ref 8.4–10.5)
Creatinine, Ser: 3.29 mg/dL — ABNORMAL HIGH (ref 0.50–1.35)
GFR calc Af Amer: 25 mL/min — ABNORMAL LOW (ref >90)
GFR calc non Af Amer: 21 mL/min — ABNORMAL LOW (ref >90)
Glucose, Bld: 122 mg/dL — ABNORMAL HIGH (ref 70–99)
POTASSIUM: 3.6 meq/L — AB (ref 3.7–5.3)
Phosphorus: 3.9 mg/dL (ref 2.3–4.6)
Sodium: 136 mEq/L — ABNORMAL LOW (ref 137–147)

## 2013-08-05 LAB — CULTURE, BLOOD (ROUTINE X 2)

## 2013-08-05 LAB — DIFFERENTIAL
BASOS PCT: 1 % (ref 0–1)
Basophils Absolute: 0.1 10*3/uL (ref 0.0–0.1)
EOS ABS: 0.1 10*3/uL (ref 0.0–0.7)
Eosinophils Relative: 1 % (ref 0–5)
Lymphocytes Relative: 8 % — ABNORMAL LOW (ref 12–46)
Lymphs Abs: 1.8 10*3/uL (ref 0.7–4.0)
MONOS PCT: 2 % — AB (ref 3–12)
Monocytes Absolute: 0.5 10*3/uL (ref 0.1–1.0)
NEUTROS ABS: 20.8 10*3/uL — AB (ref 1.7–7.7)
Neutrophils Relative %: 88 % — ABNORMAL HIGH (ref 43–77)
WBC MORPHOLOGY: INCREASED

## 2013-08-05 LAB — MAGNESIUM: MAGNESIUM: 1.9 mg/dL (ref 1.5–2.5)

## 2013-08-05 LAB — PATHOLOGIST SMEAR REVIEW

## 2013-08-05 MED ORDER — POTASSIUM CHLORIDE CRYS ER 20 MEQ PO TBCR
40.0000 meq | EXTENDED_RELEASE_TABLET | Freq: Once | ORAL | Status: AC
  Administered 2013-08-05: 40 meq via ORAL
  Filled 2013-08-05: qty 2

## 2013-08-05 NOTE — Progress Notes (Signed)
Physical Therapy Treatment Patient Details Name: Andrew Hogan MRN: [id number] DOB: 12/01/1970 Today's Date: 08/05/2013 Time: 1100-1147 PT Time Calculation (min): 47 min  PT Assessment / Plan / Recommendation  History of Present Illness Andrew Hogan is a 43 y.o. male admitted with Rt side pain/cellulitis.  Patient with  ARDS, AKI & STEMI 2/2 sepsis due to Group A strep Bacteremia and myositis concerning for necrotizing fasciitis.  Was intubated on 1/1 and extubated on 1/3.   PT Comments   Pt admitted with above. Pt currently with functional limitations due to balance and endurance deficits.  Pt will benefit from skilled PT to increase their independence and safety with mobility to allow discharge to the venue listed below.   Follow Up Recommendations  No PT follow up;Supervision - Intermittent                 Equipment Recommendations  None recommended by PT        Frequency Min 3X/week   Progress towards PT Goals Progress towards PT goals: Progressing toward goals  Plan Current plan remains appropriate    Precautions / Restrictions Precautions Precautions: Fall Restrictions Weight Bearing Restrictions: No   Pertinent Vitals/Pain VSS, no pain    Mobility  Bed Mobility Bed Mobility: Supine to Sit;Sit to Supine Supine to Sit: 5: Supervision;With rails;HOB elevated Sit to Supine: 5: Supervision;With rail;HOB elevated Details for Bed Mobility Assistance: Supervision for safety only. Transfers Transfers: Sit to Stand;Stand to Sit Sit to Stand: 4: Min assist;With upper extremity assist;From bed Stand to Sit: 4: Min guard;With upper extremity assist;To bed Details for Transfer Assistance: Verbal cues for technique and safety.  Assist for balance/safety during transfers.  Initially unsteady in standing - improved with time. Ambulation/Gait Ambulation/Gait Assistance: 4: Min assist Ambulation Distance (Feet): 250 Feet Assistive device: None Ambulation/Gait Assistance Details: Pt continues  with slow guarded gait.  Slightly unsteady at times.  Fatigues quickly for his age.  Gait Pattern: Step-through pattern;Decreased step length - right;Decreased step length - left;Decreased stride length Gait velocity: Slow gait speed Stairs: No Wheelchair Mobility Wheelchair Mobility: No    Exercises General Exercises - Lower Extremity Ankle Circles/Pumps: AROM;10 reps;Seated Long Arc Quad: AROM;10 reps;Seated Hip ABduction/ADduction: AROM;Both;10 reps;Supine Straight Leg Raises: AROM;Both;10 reps;Supine Hip Flexion/Marching: AROM;Both;10 reps;Seated   PT Goals (current goals can now be found in the care plan section)    Visit Information  Last PT Received On: 08/05/13 Assistance Needed: +1 History of Present Illness: Andrew Hogan is a 43 y.o. male admitted with Rt side pain/cellulitis.  Patient with  ARDS, AKI & STEMI 2/2 sepsis due to Group A strep Bacteremia and myositis concerning for necrotizing fasciitis.  Was intubated on 1/1 and extubated on 1/3.    Subjective Data  Subjective: "I am feeling better."   Cognition  Cognition Arousal/Alertness: Awake/alert Behavior During Therapy: WFL for tasks assessed/performed Overall Cognitive Status: Within Functional Limits for tasks assessed    Balance  High Level Balance High Level Balance Activites: Turns;Sudden stops;Head turns High Level Balance Comments: Decreased balance with high level balance activities.  End of Session PT - End of Session Equipment Utilized During Treatment: Gait belt Activity Tolerance: Patient limited by fatigue Patient left: with call bell/phone within reach;in chair Nurse Communication: Mobility status        INGOLD,Brentt Fread 08/05/2013, 12:17 PM [facility] Center[facility] In[facility],PT Acute Rehabilitation [phone] (pager)

## 2013-08-05 NOTE — Progress Notes (Signed)
S: Cont to Feel better.  No more diarrhea  O:BP 105/54  Pulse 91  Temp(Src) 98.4 F (36.9 C) (Oral)  Resp 25  Ht 5\' 11"  (1.803 m)  Wt 104.1 kg (229 lb 8 oz)  BMI 32.02 kg/m2  SpO2 99%  Intake/Output Summary (Last 24 hours) at 08/05/13 0709 Last data filed at 08/05/13 0600  Gross per 24 hour  Intake   1983 ml  Output   3328 ml  Net  -1345 ml   Weight change: 0.6 kg (1 lb 5.2 oz) ZOX:WRUEAGen:awake and alert CVS:RRR Resp:clear Abd:+ BS NTND.  Bandages Rt flank Ext: trace edema NEURO:CNI Ox3 no asterixis   . aspirin  81 mg Oral Daily  . atorvastatin  20 mg Oral q1800  . heparin subcutaneous  5,000 Units Subcutaneous Q8H  . lidocaine  2 patch Transdermal Q24H  . pencillin G potassium IV  4 Million Units Intravenous Q6H  . sodium chloride  3 mL Intravenous Q12H   No results found. BMET    Component Value Date/Time   NA 136* 08/05/2013 0515   K 3.6* 08/05/2013 0515   CL 95* 08/05/2013 0515   CO2 26 08/05/2013 0515   GLUCOSE 122* 08/05/2013 0515   BUN 63* 08/05/2013 0515   CREATININE 3.29* 08/05/2013 0515   CALCIUM 7.9* 08/05/2013 0515   GFRNONAA 21* 08/05/2013 0515   GFRAA 25* 08/05/2013 0515   CBC    Component Value Date/Time   WBC 23.8* 08/05/2013 0515   RBC 3.60* 08/05/2013 0515   HGB 10.9* 08/05/2013 0515   HCT 31.3* 08/05/2013 0515   PLT 201 08/05/2013 0515   MCV 86.9 08/05/2013 0515   MCH 30.3 08/05/2013 0515   MCHC 34.8 08/05/2013 0515   RDW 14.1 08/05/2013 0515   LYMPHSABS 2.1 08/03/2013 0530   MONOABS 0.9 08/03/2013 0530   EOSABS 0.0 08/03/2013 0530   BASOSABS 0.6* 08/03/2013 0530     Assessment: 1. AKI presumably due to ATN in setting of sepsis, improving with good UO 2. Gr A strep sepsis 3. Hypokalemia, resolved  Plan: 1.Renal fx should cont to improve hopefully to 1 though we do not know his baseline. 2. Daily Scr 3. Could transfer out of unit 4. Increase ambulation  Andrew Hogan T

## 2013-08-05 NOTE — Progress Notes (Signed)
[facility] Progress Note [place] TEAM 1 - Stepdown ICU Team   Andrew MannersWen Hogan UJW:[id number]:[id number] DOB: 09/11/1970 DOA: 07/28/2013 PCP: No primary provider on file.  Brief narrative: 43 year old otherwise healthy male. Presented to the ER for progressive right-sided chest and abdominal wall pain ongoing since the previous Wednesday. Patient endorsed on that same date he lifted a heavy box and developed this significant pain. The pain was easily treated initially with ibuprofen and left over hydrocodone from a prior prescription. On the date of admission the pain had increased significantly over a less than 24-hour period and despite utilization of hot compresses and multiple doses of over-the-counter ibuprofen he sought treatment in the emergency department. In addition to the symptoms he had had multiple episodes of loose watery bowel movements without blood over the past 3 days a total of 10-12 stools per [place]. He denied any bowel movements on the date of presentation. In addition he has noticed a rash beginning under his left axilla and was itching and thought he was developing a rash in the periumbilical region. A limited echocardiogram was performed in the ER and showed no gross obvious abnormalities. There were concerns initially the patient may be experiencing pulmonary embolus. It was important to note that at presentation patient had tachypnea, was afebrile, was hypotensive with a blood pressure 75/34 and somewhat tachycardic. While in the emergency department he received a total of 3 L of normal saline and started on empiric antibiotic therapy. His blood pressure improved and his NAP was greater than 65 saline as deemed appropriate for non-ICU admission.  Since admission patient clarified to the RN that prior to onset of above symptoms he had noticed some knots or lymph nodes that were enlarged and tender in his axilla.  He developed hypotension, renal failure, and a STEMI on 1/1 in the early AM, and  [facility]  SIGNIFICANT EVENTS / STUDIES:  12/30 - CT chest/abdomen/pelvis > Extensive soft tissue and intramuscular cellulitis/infiltration and edema from left shoulder to pelvis, no discrete abscess identified  12/30 - TTE > EF 60-65%, grade 1 diastolic dysfunction  12/31 - Renal U/S > Normal size kidneys, no hydro, 6mm stone left lower pole  01/01 - TTE > EF 55-60%, no RWMA PAP 42  01/01 - CT chest/abdomen/pelvis > NO soft tissue gas - anasarca, pleural effusions, trace pericardial effusion, bilateral airspace disease (new RML), hepatic steatosis, bilateral nonobstructive calculi  01/03 - extubated, norepinephrine weaned off  OETT 1/1 >>> 1/3  OGT 1/1 >>> 1/3  L IJ CVL 1/1 >>  HPI/Subjective: Patient has a complaint of increasing pain and swelling in right upper chest area noted this morning. Other has no complaints.   Assessment/Plan:  Sepsis due to:   A) diffuse right side fasciitis / myositis / cellulitis with left infra-axilla cellulitis   B) Bacteremia due to strep A -2 of 2 blood cx's positive for strep A -CT chest and abdomen revealed extensive fasciitis on right side but findings not consistent with necrotizing fasciitis given lack of air on CT scan -ID following/directing abx selection - noted rise in WBC counts today- left upper chest wall ultrasound does not reveal a developing abscess- follow labs -pt slowly improving after initial rapid decline   Acute respiratory failure in setting of sepsis / AMI  extubated 1/03 - no resp distress - no chronic pulmonary disease   Septic /cardiogenic shock w/ STEMI Felt to be due to demand ischemia - no further CAD eval planned  at this time   Acute renal failure due to ATN -Suspect etiology combination of dehydration from poor PO intake and severe diarrhea prior to admission, sepsis/bacterial toxins, and use of NSAIDs pre-admission   Thrombocytopenia likely sepsis related - now improving     Hypokalemia -Due to fluid shifting from dehydration and renal failure -replete when necessary  Fatty infiltration of liver  -Likely a chronic issue and does not appear contributory to current symptoms  Moderate protein calorie malnutrition  - appetite improving  DVT prophylaxis: SQ heparin Code Status: Full Family Communication: discussed w/ pt Disposition Plan/Expected LOS: Stepdown   Consultants: General surgery Infectious Disease Nephrology TRH >> PCCM >> TRH  Procedures: 2-D echocardiogram - Left ventricle: The cavity size was normal. There was moderate concentric hypertrophy. Systolic function was normal. The estimated ejection fraction was in the range of 60% to 65%. Wall motion was normal; there were no regional wall motion abnormalities. Doppler parameters are consistent with abnormal left ventricular relaxation (grade 1 diastolic dysfunction). The E/e' ratio is <10, suggesting normal LV filling pressure. - Left atrium: The atrium was normal in size. - Inferior vena cava: The vessel was normal in size; the respirophasic diameter changes were in the normal range (= 50%); findings are consistent with normal central venous pressure. - Pericardium, extracardiac: A trivial pericardial effusion was identified posterior to the heart.  Antibiotics: Vancomycin 12/29 x1; 1/2 >>>  Zosyn 12/29 >>> 12/30; 1/2 >>>  Clindamycin 12/31 >>>  Penicillin G 12/31 >>> 1/2  Objective: Blood pressure 131/80, pulse 108, temperature 99.4 F (37.4 C), temperature source Oral, resp. rate 22, height 5\' 11"  (1.803 m), weight 104.1 kg (229 lb 8 oz), SpO2 97.00%.  Intake/Output Summary (Last 24 hours) at 08/05/13 1554 Last data filed at 08/05/13 1400  Gross per 24 hour  Intake   3033 ml  Output   3251 ml  Net   -218 ml   Exam: General: no acute resp distress  Lungs: Clear to auscultation bilaterally without wheezes or crackles Cardiovascular: RRR w/o gallup or rub - no  appreciable M Chest: swelling and tenderness in right upper chest wall near axilla Abdomen: Nontender nondistended, soft, bowel sounds present, no organomegaly Musculoskeletal: No significant cyanosis, clubbing of bilateral lower extremities Neurological: Alert and oriented x 3, moves all extremities x 4 without focal neurological deficits, CN 2-12 intact Integumentary: No persisting erythema - superficial lateral wounds of right flank dressed with nonadherent dressing  Scheduled Meds:  Scheduled Meds: . aspirin  81 mg Oral Daily  . atorvastatin  20 mg Oral q1800  . heparin subcutaneous  5,000 Units Subcutaneous Q8H  . lidocaine  2 patch Transdermal Q24H  . pencillin G potassium IV  4 Million Units Intravenous Q6H  . sodium chloride  3 mL Intravenous Q12H   Data Reviewed: Basic Metabolic Panel:  Recent Labs Lab 07/31/13 0425 08/01/13 0440 08/01/13 1420 08/02/13 0500 08/03/13 0530 08/04/13 0432 08/05/13 0515  NA 131* 130* 131* 133* 136* 140 136*  K 3.2* 2.9* 3.4* 3.0* 3.5* 3.4* 3.6*  CL 87* 88* 90* 90* 93* 98 95*  CO2 21 23 25 26 28 29 26   GLUCOSE 113* 149* 112* 132* 90 89 122*  BUN 83* 85* 88* 88* 93* 85* 63*  CREATININE 5.89* 5.74* 5.67* 5.47* 5.18* 4.27* 3.29*  CALCIUM 7.0* 6.5* 7.0* 7.3* 7.6* 7.8* 7.9*  MG  --  2.1  --   --   --   --  1.9  PHOS  --   --   --   --   --  4.1 3.9   Liver Function Tests:  Recent Labs Lab 07/30/13 0446 07/31/13 0425 08/04/13 0432 08/05/13 0515  AST 50* 69*  --   --   ALT 39 39  --   --   ALKPHOS 48 87  --   --   BILITOT 1.7* 2.2*  --   --   PROT 5.0* 5.1*  --   --   ALBUMIN 1.6* 1.4* 1.6* 1.6*   No results found for this basename: LIPASE, AMYLASE,  in the last 168 hours CBC:  Recent Labs Lab 08/01/13 0340 08/01/13 1517 08/02/13 0500 08/03/13 0530 08/04/13 0432 08/05/13 0515  WBC 33.6*  --  30.0* 30.2* 21.4* 23.8*  NEUTROABS  --  28.9* 27.8* 26.6*  --  20.8*  HGB 12.8*  --  12.5* 11.6* 10.9* 10.9*  HCT 34.8*  --  34.1*  32.0* 30.6* 31.3*  MCV 83.3  --  82.8 84.2 84.8 86.9  PLT 128*  --  124* 130* 157 201   Cardiac Enzymes:  Recent Labs Lab 07/30/13 0446 07/31/13 0425 07/31/13 0915 07/31/13 1625  CKTOTAL 1565* 903*  --   --   TROPONINI  --  9.33* >20.00* >20.00*   BNP (last 3 results)  Recent Labs  07/28/13 1911  PROBNP 500.6*   CBG:  Recent Labs Lab 08/02/13 0741 08/02/13 1202 08/02/13 1951 08/02/13 2342 08/03/13 0332  GLUCAP 139* 158* 115* 94 90    Recent Results (from the past 240 hour(s))  CULTURE, BLOOD (ROUTINE X 2)     Status: None   Collection Time    07/28/13  7:05 PM      Result Value Range Status   Specimen Description BLOOD RIGHT ANTECUBITAL   Final   Special Requests NONE BOTTLES DRAWN AEROBIC AND ANAEROBIC 5CC EACH   Final   Culture  Setup Time     Final   Value: 07/29/2013 01:25     Performed at [facility]   Culture     Final   Value: GROUP A STREP (S.PYOGENES) ISOLATED     5 Note: FAXED TO [facility] CON[facility]IE WEANT 1 15 438PM BY PEAKY     Note: Gram Stain Report Called to,Read Back By and Verified With: Charlsie Merles 07/29/13 1405 BY SMITHERSJ     Performed at [facility]   Report Status 08/05/2013 FINAL   Final  CULTURE, BLOOD (ROUTINE X 2)     Status: None   Collection Time    07/28/13  7:25 PM      Result Value Range Status   Specimen Description BLOOD LEFT ANTECUBITAL   Final   Special Requests NONE BOTTLES DRAWN AEROBIC AND ANAEROBIC 10CC EACH   Final   Culture  Setup Time     Final   Value: 07/29/2013 01:25     Performed at [facility]   Culture     Final   Value: GROUP A STREP (S.PYOGENES) ISOLATED     5 Note: FAXED TO [facility] 1 15 438PM BYPEAKY     Note: Gram Stain Report Called to,Read Back By and Verified With: Charlsie Merles 07/29/13 1405 BY SMITHERSJ     Performed at [facility]   Report Status 08/05/2013 FINAL   Final  URINE CULTURE     Status: None   Collection Time    07/29/13  12:43 AM      Result Value Range Status   Specimen Description URINE, RANDOM  Final   Special Requests NONE   Final   Culture  Setup Time     Final   Value: 07/29/2013 08:59     Performed at [facility]   Colony Count     Final   Value: NO GROWTH     Performed at [facility]   Culture     Final   Value: NO GROWTH     Performed at [facility]   Report Status 07/30/2013 FINAL   Final     Studies:  Recent x-ray studies have been reviewed in detail by the Attending Physician   Calvert Cantor, MD [facility] Office  [phone] Pager [phone]  On-Call/Text Page:      [email or web address]      password [facility]  08/05/2013, 3:54 PM   LOS: 8 days

## 2013-08-06 LAB — CBC
HCT: 32.9 % — ABNORMAL LOW (ref 39.0–52.0)
Hemoglobin: 11.4 g/dL — ABNORMAL LOW (ref 13.0–17.0)
MCH: 30.2 pg (ref 26.0–34.0)
MCHC: 34.7 g/dL (ref 30.0–36.0)
MCV: 87 fL (ref 78.0–100.0)
PLATELETS: 252 10*3/uL (ref 150–400)
RBC: 3.78 MIL/uL — ABNORMAL LOW (ref 4.22–5.81)
RDW: 14.1 % (ref 11.5–15.5)
WBC: 27.1 10*3/uL — ABNORMAL HIGH (ref 4.0–10.5)

## 2013-08-06 LAB — RENAL FUNCTION PANEL
ALBUMIN: 1.8 g/dL — AB (ref 3.5–5.2)
BUN: 46 mg/dL — ABNORMAL HIGH (ref 6–23)
CO2: 24 mEq/L (ref 19–32)
Calcium: 7.8 mg/dL — ABNORMAL LOW (ref 8.4–10.5)
Chloride: 97 mEq/L (ref 96–112)
Creatinine, Ser: 2.74 mg/dL — ABNORMAL HIGH (ref 0.50–1.35)
GFR, EST AFRICAN AMERICAN: 31 mL/min — AB (ref >90)
GFR, EST NON AFRICAN AMERICAN: 27 mL/min — AB (ref >90)
Glucose, Bld: 83 mg/dL (ref 70–99)
PHOSPHORUS: 3.8 mg/dL (ref 2.3–4.6)
POTASSIUM: 4.7 meq/L (ref 3.7–5.3)
SODIUM: 134 meq/L — AB (ref 137–147)

## 2013-08-06 NOTE — Progress Notes (Signed)
[facility]    Date of Admission:  07/28/2013   Total days of antibiotics 10        Seib 3 pen G        ( d/c'd Napierkowski 6 clinda and piptazo)                   ID: Joetta Manners is a 43 y.o. male with  ARDS, AKI & STEMI 2/2 sepsis due to Group A strep Bacteremia and myositis concerning for necrotizing fasciitis  Principal Problem:   ARDS (adult respiratory distress syndrome) Active Problems:   Chest wall pain   Cellulitis   Acute renal failure   Diarrhea   Bacteremia due to Gram-positive bacteria   Severe sepsis(995.92)   Dehydration   Metabolic acidosis   Hypokalemia   Rhabdomyolysis   Fatty infiltration of liver   Acute respiratory failure with hypoxia   Septic shock(785.52)    Subjective: Remains afebrile. Felt like he had low grade fever Continues to improve, less weeping is occuring from affected right chest wall/flank/upper thigh. Good urine output  Leukocytosis peaked at 30, nadir to 21 but now creeping up to 27. AKI improving with Cr trending back to baseline  Medications:  . aspirin  81 mg Oral Daily  . atorvastatin  20 mg Oral q1800  . heparin subcutaneous  5,000 Units Subcutaneous Q8H  . lidocaine  2 patch Transdermal Q24H  . pencillin G potassium IV  4 Million Units Intravenous Q6H  . sodium chloride  3 mL Intravenous Q12H    Objective: Vital signs in last 24 hours: Temp:  [98 F (36.7 C)-99.5 F (37.5 C)] 99.5 F (37.5 C) (01/07 0805) Pulse Rate:  [90-97] 90 (01/07 0805) Resp:  [16-27] 18 (01/07 0805) BP: (116-131)/(69-77) 120/77 mmHg (01/07 0805) SpO2:  [93 %-97 %] 96 % (01/07 0805) Weight:  [226 lb 13.7 oz (102.9 kg)] 226 lb 13.7 oz (102.9 kg) (01/07 0500) Physical Exam  Constitutional: alert, soft spoken, HENT:  Mouth/Throat: Oropharynx is clear and moist. No oropharyngeal exudate.  Cardiovascular: Normal rate, regular rhythm and normal heart sounds. Exam reveals no gallop and no friction rub.  No murmur heard.  Axilla=  right axilla swollen area that is outlined slightly erythamatous Pulmonary/Chest: Effort normal and breath sounds normal. No respiratory distress. He has no wheezes.  Abdominal: Soft. Bowel sounds are normal. He exhibits no distension. There is no tenderness.  Lymphadenopathy: no cervical adenopathy.  Skin: exfoliation of finger tips Ext: +1-2 edema on legs,arms c/w anasarca     Lab Results  Recent Labs  08/05/13 0515 08/06/13 0230  WBC 23.8* 27.1*  HGB 10.9* 11.4*  HCT 31.3* 32.9*  NA 136* 134*  K 3.6* 4.7  CL 95* 97  CO2 26 24  BUN 63* 46*  CREATININE 3.29* 2.74*   Liver Panel  Recent Labs  08/05/13 0515 08/06/13 0230  ALBUMIN 1.6* 1.8*    Microbiology: 12/29  Blood cx x 2: Group A strep Studies/Results: [place] Chest  08/05/2013   CLINICAL DATA:  Right lateral chest swelling  EXAM: CHEST ULTRASOUND  TECHNIQUE: Scanning over the area of the swelling in the right anterior thorax was performed.  COMPARISON:  None.  FINDINGS: There is no evidence of the subcutaneous fluid collection to suggest abscess. Some subcutaneous edema is noted.  IMPRESSION: No evidence of abscess.   Electronically Signed   By: [name] M.D.   On: 08/05/2013 15:14     Assessment/Plan:  Group A strep myositis/celllulitis and bacteremia presenting with toxic shock = clinically much improved but in the last 2 days has increase in WBC. He is has been back to penicillin G 4MU Q 6hr IV. Will continue the course. If he has fever, repeat blood cultures.For discharge and a change to an oral equivalent.  Leukocytosis =  Continue with daily cbc. Increased in the last 2 days but no overt fever to accompany its change  AKI= appears improved. Continue with daily bmp  Sepsis management = continues on abtx for group A strep. Will narrow back to PCN since he is improve.  STEMI = resolved on telemetry, no Q wave on ECG. Etiology likely due to demand ischemia from septic shock.      Drue SecondSNIDER, [facility] Cell: [phone] Pager: [phone]  08/06/2013, 11:59 AM

## 2013-08-06 NOTE — Progress Notes (Signed)
[facility] Progress Note [place] TEAM 1 - Stepdown ICU Team   Joetta Manners ZOX:[id number] DOB: 1970/10/22 DOA: 07/28/2013 PCP: No primary provider on file.  Brief narrative: 43 year old otherwise healthy male who presented to the ER for progressive right-sided chest and abdominal wall pain ongoing for 3-4 days. The pain was easily treated initially with ibuprofen and left over hydrocodone from a prior prescription. On the date of admission the pain had increased significantly over a less than 24-hour period despite utilization of hot compresses and multiple doses of over-the-counter ibuprofen. In addition to the symptoms he had multiple episodes of loose watery bowel movements without blood over 3 days. He had also noticed a pruritic rash beginning under his left axilla and thought he was developing a rash in the periumbilical region. A limited echocardiogram was performed in the ER and showed no gross obvious abnormalities. At presentation the patient had tachypnea, was afebrile, was hypotensive with a blood pressure 75/34 and somewhat tachycardic. While in the emergency department he received a total of 3 L of normal saline and was started on empiric antibiotic therapy. His blood pressure improved/his MAP was greater than 65.  After admission patient clarified to the RN that prior to onset of above symptoms he had noticed some knots or lymph nodes that were enlarged and tender in his axilla.  He developed hypotension, renal failure, and a STEMI on 1/1 in the early AM, and was transferred to the [facility] service.  SIGNIFICANT EVENTS / STUDIES:  12/30 - CT chest/abdomen/pelvis > Extensive soft tissue and intramuscular cellulitis/infiltration and edema from left shoulder to pelvis, no discrete abscess identified  12/30 - TTE > EF 60-65%, grade 1 diastolic dysfunction  12/31 - Renal U/S > Normal size kidneys, no hydro, 6mm stone left lower pole  01/01 - TTE > EF 55-60%, no RWMA PAP 42  01/01 - CT  chest/abdomen/pelvis > NO soft tissue gas - anasarca, pleural effusions, trace pericardial effusion, bilateral airspace disease (new RML), hepatic steatosis, bilateral nonobstructive calculi  01/03 - extubated, norepinephrine weaned off  OETT 1/1 >>> 1/3  OGT 1/1 >>> 1/3  L IJ CVL 1/1 >>  HPI/Subjective: Pt reports intermittent low grade fevers, but o/w states that he feels much better.  Denies cp, sob, n/v, or abdom pain.  Is still somewhat sore over the R lateral torso.  Assessment/Plan:  Sepsis due to:   A) diffuse right side fasciitis / myositis / cellulitis with left infra-axilla cellulitis   B) Bacteremia due to strep A with toxic shock  -2 of 2 blood cx's positive for strep A from 12/29 (admit date) -CT chest and abdomen revealed extensive fasciitis on right side but findings not consistent with necrotizing fasciitis given lack of air on CT scan -ID following/directing abx selection -rising WBC count again today - left upper chest wall ultrasound 1/6 did not reveal a developing abscess- follow labs -pt slowly improving after initial rapid decline   Acute respiratory failure in setting of sepsis / AMI  extubated 1/03 - no resp distress - no chronic pulmonary disease   Septic /cardiogenic/ toxic shock w/ STEMI Felt to be due to demand ischemia - no further CAD eval planned at this time   Acute renal failure due to ATN -Suspect etiology combination of dehydration from poor PO intake and severe diarrhea prior to admission, sepsis/bacterial toxins, and use of NSAIDs pre-admission  -renal function steadily improving - lytes balanced - no acidosis   Thrombocytopenia likely sepsis related - now  resolved     Hypokalemia -Due to fluid shifting from dehydration and renal failure -resolved   Fatty infiltration of liver  -Likely a chronic issue and does not appear contributory to current symptoms - needs f/u as outpt   Moderate protein calorie malnutrition  - appetite  improving  DVT prophylaxis: SQ heparin Code Status: Full Family Communication: discussed w/ pt Disposition Plan/Expected LOS: stable for transfer to medical bed - probable d/c home in 24-48 hrs   Consultants: General surgery Infectious Disease Nephrology [facility] >> [facility] >> [facility]  Procedures: 2-D echocardiogram - Left ventricle: The cavity size was normal. There was moderate concentric hypertrophy. Systolic function was normal. The estimated ejection fraction was in the range of 60% to 65%. Wall motion was normal; there were no regional wall motion abnormalities. Doppler parameters are consistent with abnormal left ventricular relaxation (grade 1 diastolic dysfunction). The E/e' ratio is <10, suggesting normal LV filling pressure. - Left atrium: The atrium was normal in size. - Inferior vena cava: The vessel was normal in size; the respirophasic diameter changes were in the normal range (= 50%); findings are consistent with normal central venous pressure. - Pericardium, extracardiac: A trivial pericardial effusion was identified posterior to the heart.  Antibiotics: Vancomycin 12/29 x1 Zosyn 12/29 >>> 12/30 + 1/2 > 1/05 Clindamycin 12/31 > 1/04 Penicillin G 12/31 > 1/01 + 1/05 >   Objective: Blood pressure 120/77, pulse 90, temperature 99.5 F (37.5 C), temperature source Oral, resp. rate 18, height 5\' 11"  (1.803 m), weight 102.9 kg (226 lb 13.7 oz), SpO2 96.00%.  Intake/Output Summary (Last 24 hours) at 08/06/13 [id number] Last data filed at 08/06/13 0745  Gross per 24 hour  Intake   1460 ml  Output   3325 ml  Net  -1865 ml   Exam: General: no acute resp distress  Lungs: Clear to auscultation bilaterally without wheezes or crackles Cardiovascular: RRR w/o gallup or rub - no appreciable M Chest: swelling and tenderness in right upper chest wall near axilla - no erythema  Abdomen: Nontender nondistended, soft, bowel sounds present, no organomegaly Musculoskeletal:  No significant cyanosis, clubbing of bilateral lower extremities Neurological: Alert and oriented x 3, moves all extremities x 4 without focal neurological deficits, CN 2-12 intact Integumentary: No persisting erythema - superficial lateral wounds of right flank dressed with nonadherent dressing  Scheduled Meds:  Scheduled Meds: . aspirin  81 mg Oral Daily  . atorvastatin  20 mg Oral q1800  . heparin subcutaneous  5,000 Units Subcutaneous Q8H  . lidocaine  2 patch Transdermal Q24H  . pencillin G potassium IV  4 Million Units Intravenous Q6H  . sodium chloride  3 mL Intravenous Q12H   Data Reviewed: Basic Metabolic Panel:  Recent Labs Lab 07/31/13 0425 08/01/13 0440  08/02/13 0500 08/03/13 0530 08/04/13 0432 08/05/13 0515 08/06/13 0230  NA 131* 130*  < > 133* 136* 140 136* 134*  K 3.2* 2.9*  < > 3.0* 3.5* 3.4* 3.6* 4.7  CL 87* 88*  < > 90* 93* 98 95* 97  CO2 21 23  < > 26 28 29 26 24   GLUCOSE 113* 149*  < > 132* 90 89 122* 83  BUN 83* 85*  < > 88* 93* 85* 63* 46*  CREATININE 5.89* 5.74*  < > 5.47* 5.18* 4.27* 3.29* 2.74*  CALCIUM 7.0* 6.5*  < > 7.3* 7.6* 7.8* 7.9* 7.8*  MG  --  2.1  --   --   --   --  1.9  --   PHOS  --   --   --   --   --  4.1 3.9 3.8  < > = values in this interval not displayed. Liver Function Tests:  Recent Labs Lab 07/31/13 0425 08/04/13 0432 08/05/13 0515 08/06/13 0230  AST 69*  --   --   --   ALT 39  --   --   --   ALKPHOS 87  --   --   --   BILITOT 2.2*  --   --   --   PROT 5.1*  --   --   --   ALBUMIN 1.4* 1.6* 1.6* 1.8*   CBC:  Recent Labs Lab 08/01/13 1517 08/02/13 0500 08/03/13 0530 08/04/13 0432 08/05/13 0515 08/06/13 0230  WBC  --  30.0* 30.2* 21.4* 23.8* 27.1*  NEUTROABS 28.9* 27.8* 26.6*  --  20.8*  --   HGB  --  12.5* 11.6* 10.9* 10.9* 11.4*  HCT  --  34.1* 32.0* 30.6* 31.3* 32.9*  MCV  --  82.8 84.2 84.8 86.9 87.0  PLT  --  124* 130* 157 201 252   Cardiac Enzymes:  Recent Labs Lab 07/31/13 0425 07/31/13 0915  07/31/13 1625  CKTOTAL 903*  --   --   TROPONINI 9.33* >20.00* >20.00*   BNP (last 3 results)  Recent Labs  07/28/13 1911  PROBNP 500.6*   CBG:  Recent Labs Lab 08/02/13 0741 08/02/13 1202 08/02/13 1951 08/02/13 2342 08/03/13 0332  GLUCAP 139* 158* 115* 94 90    Recent Results (from the past 240 hour(s))  CULTURE, BLOOD (ROUTINE X 2)     Status: None   Collection Time    07/28/13  7:05 PM      Result Value Range Status   Specimen Description BLOOD RIGHT ANTECUBITAL   Final   Special Requests NONE BOTTLES DRAWN AEROBIC AND ANAEROBIC 5CC EACH   Final   Culture  Setup Time     Final   Value: 07/29/2013 01:25     Performed at [facility]   Culture     Final   Value: GROUP A STREP (S.PYOGENES) ISOLATED     5 Note: FAXED TO [facility] CON[facility]IE WEANT 1 15 438PM BY PEAKY     Note: Gram Stain Report Called to,Read Back By and Verified With: Charlsie Merles 07/29/13 1405 BY [email or web address]     Performed at [facility]   Report Status 08/05/2013 FINAL   Final  CULTURE, BLOOD (ROUTINE X 2)     Status: None   Collection Time    07/28/13  7:25 PM      Result Value Range Status   Specimen Description BLOOD LEFT ANTECUBITAL   Final   Special Requests NONE BOTTLES DRAWN AEROBIC AND ANAEROBIC 10CC EACH   Final   Culture  Setup Time     Final   Value: 07/29/2013 01:25     Performed at [facility]   Culture     Final   Value: GROUP A STREP (S.PYOGENES) ISOLATED     5 Note: FAXED TO [facility] 1 15 438PM BYPEAKY     Note: Gram Stain Report Called to,Read Back By and Verified With: Charlsie Merles 07/29/13 1405 BY [email or web address]     Performed at [facility]   Report Status 08/05/2013 FINAL   Final  URINE CULTURE     Status: None   Collection Time  07/29/13 12:43 AM      Result Value Range Status   Specimen Description URINE, RANDOM   Final   Special Requests NONE   Final   Culture  Setup Time     Final   Value: 07/29/2013 08:59      Performed at [facility]   Colony Count     Final   Value: NO GROWTH     Performed at [facility]   Culture     Final   Value: NO GROWTH     Performed at [facility]   Report Status 07/30/2013 FINAL   Final     Studies:  Recent x-ray studies have been reviewed in detail by the Attending Physician  Lonia Blood, MD [facility] Office  [phone] Pager [phone]  On-Call/Text Page:      [email or web address]      password [facility]  08/06/2013, 9:07 AM   LOS: 9 days

## 2013-08-06 NOTE — Progress Notes (Signed)
S: Cont to Feel better.  Up walking  O:BP 116/72  Pulse 97  Temp(Src) 98.9 F (37.2 C) (Oral)  Resp 16  Ht 5\' 11"  (1.803 m)  Wt 102.9 kg (226 lb 13.7 oz)  BMI 31.65 kg/m2  SpO2 95%  Intake/Output Summary (Last 24 hours) at 08/06/13 0728 Last data filed at 08/06/13 0400  Gross per 24 hour  Intake   2440 ml  Output   3375 ml  Net   -935 ml   Weight change: -1.2 kg (-2 lb 10.3 oz) ZOX:WRUEAGen:awake and alert CVS:RRR Resp:clear Abd:+ BS NTND.  Bandages Rt flank Ext: trace edema NEURO:CNI Ox3 no asterixis   . aspirin  81 mg Oral Daily  . atorvastatin  20 mg Oral q1800  . heparin subcutaneous  5,000 Units Subcutaneous Q8H  . lidocaine  2 patch Transdermal Q24H  . pencillin G potassium IV  4 Million Units Intravenous Q6H  . sodium chloride  3 mL Intravenous Q12H   [place] Chest  08/05/2013   CLINICAL DATA:  Right lateral chest swelling  EXAM: CHEST ULTRASOUND  TECHNIQUE: Scanning over the area of the swelling in the right anterior thorax was performed.  COMPARISON:  None.  FINDINGS: There is no evidence of the subcutaneous fluid collection to suggest abscess. Some subcutaneous edema is noted.  IMPRESSION: No evidence of abscess.   Electronically Signed   By: Maryclare BeanArt  Hoss M.D.   On: 08/05/2013 15:14   BMET    Component Value Date/Time   NA 134* 08/06/2013 0230   K 4.7 08/06/2013 0230   CL 97 08/06/2013 0230   CO2 24 08/06/2013 0230   GLUCOSE 83 08/06/2013 0230   BUN 46* 08/06/2013 0230   CREATININE 2.74* 08/06/2013 0230   CALCIUM 7.8* 08/06/2013 0230   GFRNONAA 27* 08/06/2013 0230   GFRAA 31* 08/06/2013 0230   CBC    Component Value Date/Time   WBC 27.1* 08/06/2013 0230   RBC 3.78* 08/06/2013 0230   HGB 11.4* 08/06/2013 0230   HCT 32.9* 08/06/2013 0230   PLT 252 08/06/2013 0230   MCV 87.0 08/06/2013 0230   MCH 30.2 08/06/2013 0230   MCHC 34.7 08/06/2013 0230   RDW 14.1 08/06/2013 0230   LYMPHSABS 1.8 08/05/2013 0515   MONOABS 0.5 08/05/2013 0515   EOSABS 0.1 08/05/2013 0515   BASOSABS 0.1 08/05/2013 0515      Assessment: 1. AKI presumably due to ATN in setting of sepsis, improving with good UO 2. Gr A strep sepsis, WBC increased but afebrile 3. Hypokalemia, resolved  Plan: 1.Renal fx should cont to improve hopefully to 1 though we do not know his baseline. 2. Daily Scr 3.  He has a little extra volume on him but is diuresing well on his own in the post recovery phase     Alyissa Whidbee T

## 2013-08-06 NOTE — Progress Notes (Signed)
Transfer note:  Arrival Method: tx from 2H via wheelchair Mental Orientation: A&Ox4 Telemetry: N/A Assessment: See docflowsheets Skin: Rash to right side IV: Rt FA SL Pain: Mild achy pain to right side Tubes: N/A 6E Orientation: Patient has been oriented to the unit, staff and to the room.  Family: None at the bedside

## 2013-08-07 LAB — CK TOTAL AND CKMB (NOT AT ARMC)
CK, MB: 1.3 ng/mL (ref 0.3–4.0)
RELATIVE INDEX: INVALID (ref 0.0–2.5)
Total CK: 29 U/L (ref 7–232)

## 2013-08-07 LAB — CBC
HCT: 31.5 % — ABNORMAL LOW (ref 39.0–52.0)
Hemoglobin: 10.9 g/dL — ABNORMAL LOW (ref 13.0–17.0)
MCH: 30.4 pg (ref 26.0–34.0)
MCHC: 34.6 g/dL (ref 30.0–36.0)
MCV: 88 fL (ref 78.0–100.0)
PLATELETS: 328 10*3/uL (ref 150–400)
RBC: 3.58 MIL/uL — AB (ref 4.22–5.81)
RDW: 14.3 % (ref 11.5–15.5)
WBC: 28.9 10*3/uL — AB (ref 4.0–10.5)

## 2013-08-07 LAB — BASIC METABOLIC PANEL
BUN: 33 mg/dL — ABNORMAL HIGH (ref 6–23)
CO2: 21 mEq/L (ref 19–32)
Calcium: 8.2 mg/dL — ABNORMAL LOW (ref 8.4–10.5)
Chloride: 97 mEq/L (ref 96–112)
Creatinine, Ser: 2.35 mg/dL — ABNORMAL HIGH (ref 0.50–1.35)
GFR calc Af Amer: 37 mL/min — ABNORMAL LOW (ref >90)
GFR calc non Af Amer: 32 mL/min — ABNORMAL LOW (ref >90)
Glucose, Bld: 74 mg/dL (ref 70–99)
Potassium: 5 mEq/L (ref 3.7–5.3)
SODIUM: 133 meq/L — AB (ref 137–147)

## 2013-08-07 LAB — TROPONIN I
Troponin I: 0.45 ng/mL (ref <0.30)
Troponin I: 0.37 ng/mL (ref <0.30)

## 2013-08-07 MED ORDER — ASPIRIN 81 MG PO CHEW: 81.0000 mg | CHEWABLE_TABLET | Freq: Every day | ORAL | Status: DC

## 2013-08-07 MED ORDER — OXYCODONE HCL 5 MG PO TABS: 5.0000 mg | ORAL_TABLET | ORAL | Status: DC | PRN

## 2013-08-07 MED ORDER — AMOXICILLIN 500 MG PO TABS: 500.0000 mg | ORAL_TABLET | Freq: Three times a day (TID) | ORAL | Status: DC

## 2013-08-07 MED ORDER — LIDOCAINE 5 % EX PTCH: 1.0000 | MEDICATED_PATCH | CUTANEOUS | Status: DC

## 2013-08-07 MED ORDER — ATORVASTATIN CALCIUM 20 MG PO TABS: 20.0000 mg | ORAL_TABLET | Freq: Every day | ORAL | Status: DC

## 2013-08-07 NOTE — Progress Notes (Signed)
[facility]    Date of Admission:  07/28/2013   Total days of antibiotics 11        Andrew Andrew Hogan 4 pen G        ( d/c'd Andrew Andrew Hogan 6 clinda and piptazo)                   ID: Andrew Andrew Hogan is a 43 y.o. male with  ARDS, AKI & STEMI 2/2 sepsis due to Group A strep Bacteremia and myositis concerning for necrotizing fasciitis  Principal Problem:   ARDS (adult respiratory distress syndrome) Active Problems:   Chest wall pain   Cellulitis   Acute renal failure   Diarrhea   Bacteremia due to Gram-positive bacteria   Severe sepsis(995.92)   Dehydration   Metabolic acidosis   Hypokalemia   Rhabdomyolysis   Fatty infiltration of liver   Acute respiratory failure with hypoxia   Septic shock(785.52)    Subjective: Remains afebrile.Peri Jefferson urine output  Leukocytosis  nadir to 21 but now creeping up to 28. AKI improving with Cr trending back to baseline  Medications:  . aspirin  81 mg Oral Daily  . atorvastatin  20 mg Oral q1800  . heparin subcutaneous  5,000 Units Subcutaneous Q8H  . lidocaine  2 patch Transdermal Q24H  . pencillin G potassium IV  4 Million Units Intravenous Q6H  . sodium chloride  3 mL Intravenous Q12H    Objective: Vital signs in last 24 hours: Temp:  [98.6 F (37 C)-99.6 F (37.6 C)] 98.6 F (37 C) (01/08 1610) Pulse Rate:  [91-120] 107 (01/08 0812) Resp:  [20-24] 20 (01/08 0812) BP: (107-122)/(68-82) 122/79 mmHg (01/08 0812) SpO2:  [94 %-97 %] 97 % (01/08 0812) Weight:  [224 lb 14.4 oz (102.014 kg)] 224 lb 14.4 oz (102.014 kg) (01/07 2141) Physical Exam  Constitutional: alert, soft spoken, HENT:  Mouth/Throat: Oropharynx is clear and moist. No oropharyngeal exudate.  Cardiovascular: Normal rate, regular rhythm and normal heart sounds. Exam reveals no gallop and no friction rub.  No murmur heard.  Axilla= right axilla swollen area that is outlined slightly erythamatous Pulmonary/Chest: Effort normal and breath sounds normal. No respiratory  distress. He has no wheezes.  Abdominal: Soft. Bowel sounds are normal. He exhibits no distension. There is no tenderness.  Lymphadenopathy: no cervical adenopathy.  Skin: exfoliation of finger tips Ext: +1-2 edema on legs,arms c/w anasarca     Lab Results  Recent Labs  08/06/13 0230 08/07/13 0525  WBC 27.1* 28.9*  HGB 11.4* 10.9*  HCT 32.9* 31.5*  NA 134* 133*  K 4.7 5.0  CL 97 97  CO2 24 21  BUN 46* 33*  CREATININE 2.74* 2.35*   Liver Panel  Recent Labs  08/05/13 0515 08/06/13 0230  ALBUMIN 1.6* 1.8*    Microbiology: 12/29  Blood cx x 2: Group A strep Studies/Results: [place] Chest  08/05/2013   CLINICAL DATA:  Right lateral chest swelling  EXAM: CHEST ULTRASOUND  TECHNIQUE: Scanning over the area of the swelling in the right anterior thorax was performed.  COMPARISON:  None.  FINDINGS: There is no evidence of the subcutaneous fluid collection to suggest abscess. Some subcutaneous edema is noted.  IMPRESSION: No evidence of abscess.   Electronically Signed   By: Andrew Andrew Hogan M.D.   On: 08/05/2013 15:14     Assessment/Plan: Group A strep myositis/celllulitis and bacteremia presenting with toxic shock = clinically much improved but in the last 3 days has increase  in WBC. No fever, no new areas of infection. Since he is being discharged, will change him from pen G to amoxicillin 500mg  PO TID x 10 more days to complete 3 wks of therapy  Leukocytosis =   Increased in the last 3 days but no overt fever to accompany its change. leukamoid reaction. Will check labs as outpatient prior to clinic visit  AKI= appears improved. check labs as outpatient prior to clinic visit  STEMI = resolved on telemetry, no Q wave on ECG. Still has detectable troponin. Etiology likely due to demand ischemia from septic shock. Follow up with cards as outpatient  Has appt in 7 days with Dr. Orvan Andrew Hogan on 1/15 at 3pm. Will get labs the [place] prior (Cbc with diff, bmp)   Andrew Andrew Hogan, [facility] Cell: [phone] Pager: [phone]  08/07/2013, 1:29 PM

## 2013-08-07 NOTE — Progress Notes (Signed)
S: CO pain Rt side from healing wounds O:BP 122/79  Pulse 107  Temp(Src) 98.6 F (37 C) (Oral)  Resp 20  Ht 5\' 11"  (1.803 m)  Wt 102.014 kg (224 lb 14.4 oz)  BMI 31.38 kg/m2  SpO2 97%  Intake/Output Summary (Last 24 hours) at 08/07/13 0841 Last data filed at 08/06/13 1700  Gross per 24 hour  Intake    750 ml  Output    806 ml  Net    -56 ml   Weight change: -0.886 kg (-1 lb 15.3 oz) ZOX:WRUEAGen:awake and alert CVS:RRR Resp:clear Abd:+ BS NTND.  Bandages Rt flank Ext: trace edema NEURO:CNI Ox3 no asterixis   . aspirin  81 mg Oral Daily  . atorvastatin  20 mg Oral q1800  . heparin subcutaneous  5,000 Units Subcutaneous Q8H  . lidocaine  2 patch Transdermal Q24H  . pencillin G potassium IV  4 Million Units Intravenous Q6H  . sodium chloride  3 mL Intravenous Q12H   [place] Chest  08/05/2013   CLINICAL DATA:  Right lateral chest swelling  EXAM: CHEST ULTRASOUND  TECHNIQUE: Scanning over the area of the swelling in the right anterior thorax was performed.  COMPARISON:  None.  FINDINGS: There is no evidence of the subcutaneous fluid collection to suggest abscess. Some subcutaneous edema is noted.  IMPRESSION: No evidence of abscess.   Electronically Signed   By: Maryclare BeanArt  Hoss M.D.   On: 08/05/2013 15:14   BMET    Component Value Date/Time   NA 133* 08/07/2013 0525   K 5.0 08/07/2013 0525   CL 97 08/07/2013 0525   CO2 21 08/07/2013 0525   GLUCOSE 74 08/07/2013 0525   BUN 33* 08/07/2013 0525   CREATININE 2.35* 08/07/2013 0525   CALCIUM 8.2* 08/07/2013 0525   GFRNONAA 32* 08/07/2013 0525   GFRAA 37* 08/07/2013 0525   CBC    Component Value Date/Time   WBC 28.9* 08/07/2013 0525   RBC 3.58* 08/07/2013 0525   HGB 10.9* 08/07/2013 0525   HCT 31.5* 08/07/2013 0525   PLT 328 08/07/2013 0525   MCV 88.0 08/07/2013 0525   MCH 30.4 08/07/2013 0525   MCHC 34.6 08/07/2013 0525   RDW 14.3 08/07/2013 0525   LYMPHSABS 1.8 08/05/2013 0515   MONOABS 0.5 08/05/2013 0515   EOSABS 0.1 08/05/2013 0515   BASOSABS 0.1 08/05/2013 0515      Assessment: 1. AKI presumably due to ATN in setting of sepsis, improving with good UO 2. Gr A strep sepsis, WBC increased but afebrile 3. Hypokalemia, resolved  Plan: 1.Renal fx cont to improve though not clear what his baseline is.  Edema will improve on its own as UO is excellent 2. Will sign off.  He can FU with his PCP.  If Scr does not return to normal in next 2 -3 weeks then his PCP can refer back to see Dr Bonnielee HaffPowell    Naylah Cork T

## 2013-08-07 NOTE — Progress Notes (Signed)
Physical Therapy Treatment Patient Details Name: Andrew Hogan MRN: [id number] DOB: May 22, 1971 Today's Date: 08/07/2013 Time: 0762-2633 PT Time Calculation (min): 17 min  PT Assessment / Plan / Recommendation  History of Present Illness Andrew Hogan is a 43 y.o. male admitted with Rt side pain/cellulitis.  Patient with  ARDS, AKI & STEMI 2/2 sepsis due to Group A strep Bacteremia and myositis concerning for necrotizing fasciitis.  Was intubated on 1/1 and extubated on 1/3.   PT Comments   Pt able to increase ambulation distance and was independent with mobility. Pt demo good technique and stability with high level balance activities. All education is complete. Pt encouraged to ambulate unit with RN permission 2-3x's daily. Will sign off on pt at this time.   Follow Up Recommendations  No PT follow up;Supervision - Intermittent     Does the patient have the potential to tolerate intense rehabilitation     Barriers to Discharge        Equipment Recommendations  None recommended by PT    Recommendations for Other Services    Frequency Min 3X/week   Progress towards PT Goals Progress towards PT goals: Goals met/education completed, patient discharged from PT  Plan Current plan remains appropriate    Precautions / Restrictions Precautions Precautions: None Restrictions Weight Bearing Restrictions: No   Pertinent Vitals/Pain 5-6/10 in abdomen. patient repositioned for comfort     Mobility  Transfers Overall transfer level: Independent Equipment used: None General transfer comment: pt transferred from toilet independently and to chair independently  Ambulation/Gait Ambulation/Gait assistance: Independent Ambulation Distance (Feet): 200 Feet Assistive device: None Gait Pattern/deviations: WFL(Within Functional Limits) Gait velocity: Slow gait speed Gait velocity interpretation: Below normal speed for age/gender General Gait Details: pt slightly guarded with gt due to pain in abdomen        PT Diagnosis:    PT Problem List:   PT Treatment Interventions:     PT Goals (current goals can now be found in the care plan section) Acute Rehab PT Goals Patient Stated Goal: to go home soon PT Goal Formulation: With patient Time For Goal Achievement: 08/11/13 Potential to Achieve Goals: Good  Visit Information  Last PT Received On: 08/07/13 Assistance Needed: +1 History of Present Illness: Andrew Hogan is a 43 y.o. male admitted with Rt side pain/cellulitis.  Patient with  ARDS, AKI & STEMI 2/2 sepsis due to Group A strep Bacteremia and myositis concerning for necrotizing fasciitis.  Was intubated on 1/1 and extubated on 1/3.    Subjective Data  Subjective: "i still have pain. I am feeling better" pt was up in bathroom independently upon arrival Patient Stated Goal: to go home soon   Cognition  Cognition Arousal/Alertness: Awake/alert Behavior During Therapy: WFL for tasks assessed/performed Overall Cognitive Status: Within Functional Limits for tasks assessed    Balance  Balance Overall balance assessment: Modified Independent High Level Balance Comments: pt demo good technique with incr time for high level balance activites due to pain in abdomen; performed head turns, directional changes, gt speed changes  End of Session PT - End of Session Equipment Utilized During Treatment: Gait belt Activity Tolerance: Patient tolerated treatment well Patient left: in chair;with call bell/phone within reach Nurse Communication: Mobility status   GP     Gustavus Bryant, [place] 08/07/2013, 8:08 AM

## 2013-08-07 NOTE — Discharge Summary (Signed)
Physician Discharge Summary  Andrew Hogan ZOX:[id number] DOB: Feb 25, 1971 DOA: 07/28/2013  PCP: No primary provider on file.  Admit date: 07/28/2013 Discharge date: 08/07/2013  Time spent: >45 minutes  Recommendations for Outpatient Follow-up:  1. Will need Bmet and CBC in 1 wk  Discharge Diagnoses:  Principal Problem: Severe Myositis/ fascitis of left chest and abdominal wall/ Septic shock Active Problems:   ARDS (adult respiratory distress syndrome) / Acute respiratory failure with hypoxia   Bacteremia due to Gram-positive bacteria   Acute renal failure   Diarrhea   Dehydration   Metabolic acidosis   Hypokalemia   Rhabdomyolysis   Fatty infiltration of liver   Septic shock(785.52)   Discharge Condition: stable  Diet recommendation: heart healty  Filed Weights   08/05/13 0500 08/06/13 0500 08/06/13 2141  Weight: 104.1 kg (229 lb 8 oz) 102.9 kg (226 lb 13.7 oz) 102.014 kg (224 lb 14.4 oz)    History of present illness:  43 year old otherwise healthy male who presented to the ER for progressive right-sided chest and abdominal wall pain ongoing for 3-4 days. The pain was easily treated initially with ibuprofen and left over hydrocodone from a prior prescription. On the date of admission the pain had increased significantly over a less than 24-hour period despite utilization of hot compresses and multiple doses of over-the-counter ibuprofen. In addition to the symptoms he had multiple episodes of loose watery bowel movements without blood over 3 days. He had also noticed a pruritic rash beginning under his left axilla and thought he was developing a rash in the periumbilical region. A limited echocardiogram was performed in the ER and showed no gross obvious abnormalities. At presentation the patient had tachypnea, was afebrile, was hypotensive with a blood pressure 75/34 and somewhat tachycardic. While in the emergency department he received a total of 3 L of normal saline and was started  on empiric antibiotic therapy. His blood pressure improved/his MAP was greater than 65.  After admission patient clarified to the RN that prior to onset of above symptoms he had noticed some knots or lymph nodes that were enlarged and tender in his axilla.  He developed hypotension, renal failure, and a STEMI on 1/1 in the early AM, and was transferred to the [facility] service.  SIGNIFICANT EVENTS / STUDIES:  12/30 - CT chest/abdomen/pelvis > Extensive soft tissue and intramuscular cellulitis/infiltration and edema from left shoulder to pelvis, no discrete abscess identified  12/30 - TTE > EF 60-65%, grade 1 diastolic dysfunction  12/31 - Renal U/S > Normal size kidneys, no hydro, 6mm stone left lower pole  01/01 - TTE > EF 55-60%, no RWMA PAP 42  01/01 - CT chest/abdomen/pelvis > NO soft tissue gas - anasarca, pleural effusions, trace pericardial effusion, bilateral airspace disease (new RML), hepatic steatosis, bilateral nonobstructive calculi  01/03 - extubated, norepinephrine weaned off  OETT 1/1 >>> 1/3  OGT 1/1 >>> 1/3  L IJ CVL 1/1 >>  Hospital Course:  Sepsis due to:  A) diffuse right side fasciitis / myositis / cellulitis with left infra-axilla cellulitis  B) Bacteremia due to strep A with toxic shock  -2 of 2 blood cx's positive for strep A from 12/29 (admit date)  -CT chest and abdomen revealed extensive fasciitis on right side but findings not consistent with necrotizing fasciitis given lack of air on CT scan  -ID following/directing abx selection  -rising WBC count again today - left upper chest wall ultrasound 1/6 did not reveal a developing abscess- follow labs  -  pt slowly improving after initial rapid decline   Acute respiratory failure in setting of sepsis / AMI  extubated 1/03 - no resp distress - no chronic pulmonary disease   Septic /cardiogenic/ toxic shock w/ STEMI  Felt to be due to demand ischemia - no further CAD eval planned at this time   Acute renal failure due  to ATN  -Suspect etiology combination of dehydration from poor PO intake, severe diarrhea prior to admission, sepsis/bacterial toxins, and use of NSAIDs pre-admission  -renal function steadily improving - lytes balanced - no acidosis  - repeat renal function in 1-2 wks  Thrombocytopenia  likely sepsis related - now resolved   Hypokalemia  -Due to fluid shifting from dehydration and renal failure  -resolved   Fatty infiltration of liver  -Likely a chronic issue and does not appear contributory to current symptoms - needs f/u as outpt   Moderate protein calorie malnutrition  - appetite improving   Procedures: 2-D echocardiogram  - Left ventricle: The cavity size was normal. There was moderate concentric hypertrophy. Systolic function was normal. The estimated ejection fraction was in the range of 60% to 65%. Wall motion was normal; there were no regional wall motion abnormalities. Doppler parameters are consistent with abnormal left ventricular relaxation (grade 1 diastolic dysfunction). The E/e' ratio is <10, suggesting normal LV filling pressure. - Left atrium: The atrium was normal in size. - Inferior vena cava: The vessel was normal in size; the respirophasic diameter changes were in the normal range (= 50%); findings are consistent with normal central venous pressure. - Pericardium, extracardiac: A trivial pericardial effusion was identified posterior to the heart.    Consultations: General surgery  Infectious Disease  Nephrology  TRH >> PCCM >> [facility]  Cardiology  Antibiotics:  Vancomycin 12/29 x1  Zosyn 12/29 >>> 12/30 + 1/2 > 1/05  Clindamycin 12/31 > 1/04  Penicillin G 12/31 > 1/01 + 1/05 >   Discharge Exam: Filed Vitals:   08/07/13 0812  BP: 122/79  Pulse: 107  Temp: 98.6 F (37 C)  Resp: 20    General: AAO x 3 Cardiovascular: RRR, no murmurs Respiratory: CTA b/l  Chest/ Abdomen- fullness in right chest and abdominal wall with sloughing of skin  from ruptured blisters  Discharge Instructions      Discharge Orders   Future Appointments Provider Department Dept Phone   08/14/2013 3:00 PM Cliffton Asters, MD [facility] [phone]   Future Orders Complete By Expires   Diet - low sodium heart healthy  As directed    Discharge instructions  As directed    Comments:     If you develop a fever, please call the ID clinic [phone] Maryelizabeth Rowan, MD may be accepting new patients. Office number- [phone]   Increase activity slowly  As directed        Medication List    STOP taking these medications       HYDROCODONE-ACETAMINOPHEN PO     ibuprofen 200 MG tablet  Commonly known as:  ADVIL,MOTRIN      TAKE these medications       amoxicillin 500 MG tablet  Commonly known as:  AMOXIL  Take 1 tablet (500 mg total) by mouth 3 (three) times daily.     atorvastatin 20 MG tablet  Commonly known as:  LIPITOR  Take 1 tablet (20 mg total) by mouth daily at 6 PM.     lidocaine 5 %  Commonly  known as:  LIDODERM  Place 1 patch onto the skin daily. Remove & Discard patch within 12 hours or as directed by MD     oxyCODONE 5 MG immediate release tablet  Commonly known as:  Oxy IR/ROXICODONE  Take 1 tablet (5 mg total) by mouth every 4 (four) hours as needed for severe pain.       No Known Allergies Follow-up Information   Follow up with Cliffton Asters, MD. (Thrusday 1/15 3pm- go to clinic on Wednesday for blood work please)    Specialty:  Infectious Diseases   Contact information:   [address] [place] [address] [phone]        The results of significant diagnostics from this hospitalization (including imaging, microbiology, ancillary and laboratory) are listed below for reference.    Significant Diagnostic Studies: Ct Abdomen Pelvis Wo Contrast  07/31/2013   CLINICAL DATA:  Evaluate for gas in the right flank.  EXAM: CT CHEST, ABDOMEN AND PELVIS  WITHOUT CONTRAST  TECHNIQUE: Multidetector CT imaging of the chest, abdomen and pelvis was performed following the standard protocol without IV contrast.  COMPARISON:  No priors.  FINDINGS: CT CHEST FINDINGS  Mediastinum: Left internal jugular central venous catheter with tip terminating in the mid superior vena cava. Endotracheal tube with tip in the upper trachea shortly above the level of the aortic arch. Nasogastric tube in the body of the stomach. Heart size is borderline enlarged. Trace amount of pericardial fluid or thickening, unlikely to be of hemodynamic significance at this time. No associated pericardial calcification. No pathologically enlarged mediastinal or hilar lymph nodes. Please note that accurate exclusion of hilar adenopathy is limited on noncontrast CT scans. Esophagus is unremarkable in appearance.  Lungs/Pleura: Small bilateral pleural effusions (right greater than left) layering dependently with some associated passive atelectasis throughout the lower lobes of the lungs bilaterally. Some superimposed airspace consolidation in the lower lobes of the lungs bilaterally is also suspected, potentially related to mild aspiration. Several foci of peribronchovascular ground-glass attenuation are also noted in the right middle lobe, which could represent some endobronchial spread of infection/inflammation. No other suspicious appearing pulmonary nodules or masses are noted. No pneumothorax.  Musculoskeletal: There are no aggressive appearing lytic or blastic lesions noted in the visualized portions of the skeleton.  CT ABDOMEN AND PELVIS FINDINGS  Abdomen/Pelvis: Multiple nonobstructive calculi are noted within the collecting systems of the kidneys bilaterally, largest of which is in the lower pole collecting system of the left kidney measuring 1 cm. No ureteral stones or findings of urinary tract obstruction are noted at this time. The unenhanced appearance of the kidneys is otherwise unremarkable  bilaterally. Several sub cm low-attenuation hepatic lesions are noted, but are incompletely characterized on today's non contrast CT examination. Diffuse decreased attenuation throughout the hepatic parenchyma, indicative of hepatic steatosis. The unenhanced appearance of the gallbladder, pancreas, spleen and bilateral adrenal glands is unremarkable.  Trace volume of free fluid in the pelvis. No larger volume of ascites. No pneumoperitoneum. No pathologic distention of small bowel. No definite lymphadenopathy identified within the abdomen or pelvis on today's non contrast CT examination. Prostate gland and urinary bladder are unremarkable in appearance. A Foley balloon catheter is present with tip in the lumen of the urinary bladder.  Musculoskeletal: Diffuse body wall edema. No definite gas identified within the soft tissues of the right flank. There are no aggressive appearing lytic or blastic lesions noted in the visualized portions of the skeleton.  IMPRESSION: 1.  No subcutaneous or intramuscular gas identified within the soft tissues of the right flank. 2. Diffuse body wall edema with small bilateral pleural effusions, trace ascites and trace amount of pericardial fluid, likely to reflect a state of anasarca. 3. In addition to the areas of passive atelectasis in the lower lobes of the lungs bilaterally there is likely some superimposed airspace consolidation, as well as small foci of airspace disease in the right middle lobe, concerning for sequela of aspiration. 4. Hepatic steatosis. 5. Nonobstructive calculi within the collecting systems of the kidneys bilaterally, largest of which measures 1 cm in the lower pole of the left kidney. No ureteral stones or findings of urinary tract obstruction at this time. 6. Support apparatus, as above.   Electronically Signed   By: Trudie Reed M.D.   On: 07/31/2013 23:08   Ct Abdomen Pelvis Wo Contrast  07/29/2013   CLINICAL DATA:  Severe pain in the right side. No  injury. Redness and swelling of the skin with suspected cellulitis. Renal failure.  EXAM: CT CHEST WITHOUT CONTRAST; CT ABDOMEN AND PELVIS WITHOUT CONTRAST  TECHNIQUE: Multidetector CT imaging of the chest was performed following the standard protocol without IV contrast.; Multidetector CT imaging of the abdomen and pelvis was performed following the standard protocol without intravenous contrast.  COMPARISON:  None.  FINDINGS: CT chest: There is extensive infiltration in the subcutaneous fat and superficial muscle layers involving the right shoulder, right axilla, right lateral chest wall, right abdominal wall and right flank extending posteriorly to the level of the midline over the spinous process. Changes extend from the shoulder down to the right groin. This is consistent with extensive cellulitis, edema, and/or contusion. There is no discrete fluid collection that would suggest evidence of a focal abscess. Fluid and edema is demonstrated throughout the area and in between the muscle layers.  Normal heart size. Normal caliber thoracic aorta. No significant lymphadenopathy in the chest. Atelectasis in both lung bases. No pneumothorax or effusion. Esophagus is decompressed. Esophageal diverticulum may be present. Normal alignment of the thoracic spine. Sternum and ribs appear intact.  CT abdomen and pelvis: Right flank and abdominal wall changes as previously described above. Diffuse fatty infiltration of the liver. Multiple stones demonstrated in both kidneys with stones also demonstrated in the right renal pelvis. No evidence of pyelocaliectasis or ureterectasis. No ureteral stones are visualized. Small accessory spleen. The gallbladder demonstrates somewhat diffuse increased density throughout, suggesting sludge or milk of calcium. The unenhanced appearance of the spleen, pancreas, adrenal glands, abdominal aorta, inferior vena cava, and retroperitoneal lymph nodes is unremarkable the stomach, small bowel, and  colon are not abnormally distended. No free air or free fluid in the abdomen. No infiltration in the intra-abdominal fat.  Pelvis: Prostate gland is not enlarged. Bladder wall is not thickened. No free or loculated pelvic fluid collections. The appendix is normal. No evidence of diverticulitis. Mild degenerative changes in the lumbar spine with normal alignment. Pelvis, sacrum thumb and hips appear intact. Benign-appearing area of sclerosis in the right hemipelvis.  IMPRESSION: Extensive subcutaneous soft tissue and intramuscular cellulitis, infiltration/edema, or contusion along the right side of the chest and abdomen wall extending from the level of the shoulder to the pelvis. No discrete abscess is identified.   Electronically Signed   By: Burman Nieves M.D.   On: 07/29/2013 04:37   Dg Chest 2 View  07/30/2013   CLINICAL DATA:  Pain.  EXAM: CHEST  2 VIEW  COMPARISON:  Chest x-ray 07/30/2013.  Chest CT 07/29/2013.  FINDINGS: Prominent bibasilar atelectasis and/or pneumonia noted. No pneumothorax. Stable cardiomegaly, no pulmonary venous distention or prominent pleural effusion. Mild pleural thickening present. No acute osseous abnormality. Degenerative changes thoracic spine.  IMPRESSION: Dense bibasilar atelectasis and/or infiltrates.   Electronically Signed   By: Maisie Fus  Register   On: 07/30/2013 14:09   Dg Abd 1 View  07/30/2013   CLINICAL DATA:  Pain.  EXAM: ABDOMEN - 1 VIEW  COMPARISON:  CT 07/29/2013.  FINDINGS: As noted on CT diffuse right side secondary soft tissue swelling is present. Air-filled nondilated loops of small and large bowel noted. This is a nonspecific finding. Bilateral nephrolithiasis. No acute bony abnormality. Sclerotic density noted in the right ilium most consistent with bone island. No other sclerotic bony densities noted to suggest metastatic disease.  IMPRESSION: 1. Soft tissue swelling of the right abdomen subcutaneous tissue, reference made to recent CT report. 2.  Bilateral nephrolithiasis. 3. Nonspecific nondistended air-filled loops of small large bowel noted. 4. Sclerotic density node in the right iliac wing consistent with bone island .   Electronically Signed   By: Maisie Fus  Register   On: 07/30/2013 13:33   Ct Chest Wo Contrast  07/31/2013   CLINICAL DATA:  Evaluate for gas in the right flank.  EXAM: CT CHEST, ABDOMEN AND PELVIS WITHOUT CONTRAST  TECHNIQUE: Multidetector CT imaging of the chest, abdomen and pelvis was performed following the standard protocol without IV contrast.  COMPARISON:  No priors.  FINDINGS: CT CHEST FINDINGS  Mediastinum: Left internal jugular central venous catheter with tip terminating in the mid superior vena cava. Endotracheal tube with tip in the upper trachea shortly above the level of the aortic arch. Nasogastric tube in the body of the stomach. Heart size is borderline enlarged. Trace amount of pericardial fluid or thickening, unlikely to be of hemodynamic significance at this time. No associated pericardial calcification. No pathologically enlarged mediastinal or hilar lymph nodes. Please note that accurate exclusion of hilar adenopathy is limited on noncontrast CT scans. Esophagus is unremarkable in appearance.  Lungs/Pleura: Small bilateral pleural effusions (right greater than left) layering dependently with some associated passive atelectasis throughout the lower lobes of the lungs bilaterally. Some superimposed airspace consolidation in the lower lobes of the lungs bilaterally is also suspected, potentially related to mild aspiration. Several foci of peribronchovascular ground-glass attenuation are also noted in the right middle lobe, which could represent some endobronchial spread of infection/inflammation. No other suspicious appearing pulmonary nodules or masses are noted. No pneumothorax.  Musculoskeletal: There are no aggressive appearing lytic or blastic lesions noted in the visualized portions of the skeleton.  CT ABDOMEN  AND PELVIS FINDINGS  Abdomen/Pelvis: Multiple nonobstructive calculi are noted within the collecting systems of the kidneys bilaterally, largest of which is in the lower pole collecting system of the left kidney measuring 1 cm. No ureteral stones or findings of urinary tract obstruction are noted at this time. The unenhanced appearance of the kidneys is otherwise unremarkable bilaterally. Several sub cm low-attenuation hepatic lesions are noted, but are incompletely characterized on today's non contrast CT examination. Diffuse decreased attenuation throughout the hepatic parenchyma, indicative of hepatic steatosis. The unenhanced appearance of the gallbladder, pancreas, spleen and bilateral adrenal glands is unremarkable.  Trace volume of free fluid in the pelvis. No larger volume of ascites. No pneumoperitoneum. No pathologic distention of small bowel. No definite lymphadenopathy identified within the abdomen or pelvis on today's non contrast CT examination. Prostate gland and urinary bladder are unremarkable  in appearance. A Foley balloon catheter is present with tip in the lumen of the urinary bladder.  Musculoskeletal: Diffuse body wall edema. No definite gas identified within the soft tissues of the right flank. There are no aggressive appearing lytic or blastic lesions noted in the visualized portions of the skeleton.  IMPRESSION: 1. No subcutaneous or intramuscular gas identified within the soft tissues of the right flank. 2. Diffuse body wall edema with small bilateral pleural effusions, trace ascites and trace amount of pericardial fluid, likely to reflect a state of anasarca. 3. In addition to the areas of passive atelectasis in the lower lobes of the lungs bilaterally there is likely some superimposed airspace consolidation, as well as small foci of airspace disease in the right middle lobe, concerning for sequela of aspiration. 4. Hepatic steatosis. 5. Nonobstructive calculi within the collecting systems  of the kidneys bilaterally, largest of which measures 1 cm in the lower pole of the left kidney. No ureteral stones or findings of urinary tract obstruction at this time. 6. Support apparatus, as above.   Electronically Signed   By: Trudie Reed M.D.   On: 07/31/2013 23:08   Ct Chest Wo Contrast  07/29/2013   CLINICAL DATA:  Severe pain in the right side. No injury. Redness and swelling of the skin with suspected cellulitis. Renal failure.  EXAM: CT CHEST WITHOUT CONTRAST; CT ABDOMEN AND PELVIS WITHOUT CONTRAST  TECHNIQUE: Multidetector CT imaging of the chest was performed following the standard protocol without IV contrast.; Multidetector CT imaging of the abdomen and pelvis was performed following the standard protocol without intravenous contrast.  COMPARISON:  None.  FINDINGS: CT chest: There is extensive infiltration in the subcutaneous fat and superficial muscle layers involving the right shoulder, right axilla, right lateral chest wall, right abdominal wall and right flank extending posteriorly to the level of the midline over the spinous process. Changes extend from the shoulder down to the right groin. This is consistent with extensive cellulitis, edema, and/or contusion. There is no discrete fluid collection that would suggest evidence of a focal abscess. Fluid and edema is demonstrated throughout the area and in between the muscle layers.  Normal heart size. Normal caliber thoracic aorta. No significant lymphadenopathy in the chest. Atelectasis in both lung bases. No pneumothorax or effusion. Esophagus is decompressed. Esophageal diverticulum may be present. Normal alignment of the thoracic spine. Sternum and ribs appear intact.  CT abdomen and pelvis: Right flank and abdominal wall changes as previously described above. Diffuse fatty infiltration of the liver. Multiple stones demonstrated in both kidneys with stones also demonstrated in the right renal pelvis. No evidence of pyelocaliectasis or  ureterectasis. No ureteral stones are visualized. Small accessory spleen. The gallbladder demonstrates somewhat diffuse increased density throughout, suggesting sludge or milk of calcium. The unenhanced appearance of the spleen, pancreas, adrenal glands, abdominal aorta, inferior vena cava, and retroperitoneal lymph nodes is unremarkable the stomach, small bowel, and colon are not abnormally distended. No free air or free fluid in the abdomen. No infiltration in the intra-abdominal fat.  Pelvis: Prostate gland is not enlarged. Bladder wall is not thickened. No free or loculated pelvic fluid collections. The appendix is normal. No evidence of diverticulitis. Mild degenerative changes in the lumbar spine with normal alignment. Pelvis, sacrum thumb and hips appear intact. Benign-appearing area of sclerosis in the right hemipelvis.  IMPRESSION: Extensive subcutaneous soft tissue and intramuscular cellulitis, infiltration/edema, or contusion along the right side of the chest and abdomen wall extending from the  level of the shoulder to the pelvis. No discrete abscess is identified.   Electronically Signed   By: Burman Nieves M.D.   On: 07/29/2013 04:37   [place] Chest  08/05/2013   CLINICAL DATA:  Right lateral chest swelling  EXAM: CHEST ULTRASOUND  TECHNIQUE: Scanning over the area of the swelling in the right anterior thorax was performed.  COMPARISON:  None.  FINDINGS: There is no evidence of the subcutaneous fluid collection to suggest abscess. Some subcutaneous edema is noted.  IMPRESSION: No evidence of abscess.   Electronically Signed   By: [name] M.D.   On: 08/05/2013 15:14   US Renal  07/30/2013   CLINICAL DATA:  Acute renal insufficiency.  Elevated creatinine.  EXAM: RENAL/URINARY TRACT ULTRASOUND COMPLETE  COMPARISON:  CT 07/29/2013.  FINDINGS: Right Kidney:  Length: 12.3 cm. Echogenicity within normal limits. No mass or hydronephrosis visualized.  Left Kidney:  Length: 13.1 cm. Echogenicity within  normal limits. 6 mm stone over the lower pole. No mass or hydronephrosis visualized.  Bladder:  Decompressed as Foley catheter is present.  IMPRESSION: Normal size kidneys without evidence of hydronephrosis.  6 mm stone over the left lower pole.   Electronically Signed   By: Elberta Fortis M.D.   On: 07/30/2013 21:54   Dg Chest Port 1 View  08/03/2013   CLINICAL DATA:  Extubation.  EXAM: PORTABLE CHEST - 1 VIEW  COMPARISON:  Portable chest 08/02/2013.  CT 07/31/2013.  FINDINGS: 0512 hr. Interval extubation with removal of the nasogastric tube. The left IJ central venous catheter is unchanged at the lower SVC level. There is improved aeration of the lungs with resolution of edema. Minimal left lower lobe atelectasis remains. There is no significant pleural effusion or pneumothorax. Mild cardiomegaly is stable.  IMPRESSION: Significantly improved pulmonary aeration with resolution of edema following extubation. No acute findings demonstrated.   Electronically Signed   By: Roxy Horseman M.D.   On: 08/03/2013 07:58   Dg Chest Port 1 View  08/02/2013   CLINICAL DATA:  Evaluate for infiltrates  EXAM: PORTABLE CHEST - 1 VIEW  COMPARISON:  July 31, 2013  FINDINGS: Endotracheal tube ends at the thoracic inlet, projecting at the level of the clavicular heads. Left IJ catheter in unchanged position. Enteric tube crosses the diaphragm.  Unchanged cardiopericardial enlargement. Haziness of the bilateral chest is relatively similar given differences in projection, pleural fluid and atelectasis based on previous CT. In the lingular region, the lung is better aerated.  IMPRESSION: 1. Good positioning of tubes and lines. 2. Layering pleural effusions, no indication of interval increase. 3. Improved left lung aeration in this patient with atelectasis and consolidation by recent CT.   Electronically Signed   By: Tiburcio Pea M.D.   On: 08/02/2013 06:12   Portable Chest Xray  07/31/2013   CLINICAL DATA:  Intubation.  EXAM:  PORTABLE CHEST - 1 VIEW  COMPARISON:  Earlier the same date.  FINDINGS: 1418 hr. The endotracheal tube is well positioned in the midtrachea. A nasogastric tube projects below the diaphragm. Left IJ central venous catheter is unchanged at the level of the mid SVC. There is persistent pulmonary edema with increased opacity at the left lung base, likely due to a combination of pleural fluid and basilar airspace disease. No pneumothorax is evident. The heart size and mediastinal contours are stable.  IMPRESSION: Satisfactory position of the newly placed endotracheal and nasogastric tubes. Increased left basilar airspace disease and probable adjacent pleural fluid.  Electronically Signed   By: Roxy HorsemanBill  Veazey M.D.   On: 07/31/2013 14:34   Dg Chest Port 1 View  07/31/2013   CLINICAL DATA:  Shortness of breath  EXAM: PORTABLE CHEST - 1 VIEW  COMPARISON:  07/31/2013  FINDINGS: Left-sided jugular central venous catheter in unchanged position. Bilateral interstitial and alveolar airspace opacities with bilateral central pulmonary vascular prominence. No pleural effusions or pneumothorax. Stable cardiomediastinal silhouette. Unremarkable osseous structures.  IMPRESSION: Bilateral interstitial and alveolar airspace opacities as can be seen with an infectious or inflammatory process versus pulmonary edema.   Electronically Signed   By: Elige KoHetal  Patel   On: 07/31/2013 11:38   Dg Chest Port 1 View  07/31/2013   CLINICAL DATA:  Shortness of breath, status post line placement  EXAM: PORTABLE CHEST - 1 VIEW  COMPARISON:  07/30/2013  FINDINGS: There is a left-sided jugular central venous catheter with the tip projecting over the confluence of the left brachiocephalic and SVC. There are bilateral interstitial and alveolar airspace opacities primarily at the lung bases. There is no pleural effusion or pneumothorax. Stable cardiomediastinal silhouette. The osseous structures are unremarkable.  IMPRESSION: 1. Left-sided jugular central  venous catheter with the tip projecting over the confluence of the left brachiocephalic and SVC. 2. Bilateral interstitial and alveolar airspace opacities as can be seen with an infectious or inflammatory etiology versus interstitial edema.   Electronically Signed   By: Elige KoHetal  Patel   On: 07/31/2013 07:59   Dg Chest Port 1 View  07/30/2013   CLINICAL DATA:  Shortness of breath  EXAM: PORTABLE CHEST - 1 VIEW  COMPARISON:  07/28/2013  FINDINGS: The heart size and mediastinal contours are within normal limits. Both lungs are clear. The visualized skeletal structures are unremarkable.  IMPRESSION: No active disease.   Electronically Signed   By: Alcide CleverMark  Lukens M.D.   On: 07/30/2013 08:13   Dg Chest Portable 1 View  07/28/2013   CLINICAL DATA:  Chest pain  EXAM: PORTABLE CHEST - 1 VIEW  COMPARISON:  None.  FINDINGS: The heart size and mediastinal contours are within normal limits. There is no focal infiltrate, pulmonary edema, or pleural effusion. The visualized skeletal structures are unremarkable.  IMPRESSION: No active disease.   Electronically Signed   By: Sherian ReinWei-Chen  Lin M.D.   On: 07/28/2013 19:37    Microbiology: Recent Results (from the past 240 hour(s))  CULTURE, BLOOD (ROUTINE X 2)     Status: None   Collection Time    07/28/13  7:05 PM      Result Value Range Status   Specimen Description BLOOD RIGHT ANTECUBITAL   Final   Special Requests NONE BOTTLES DRAWN AEROBIC AND ANAEROBIC [facility]5CC EACH   Final   Culture  Setup Time     Final   Value: 07/29/2013 01:25     Performed at [facility]   Culture     Final   Value: GROUP A STREP (S.PYOGENES) ISOLATED     5 Note: FAXED TO [facility]IE WEANT 1 15 438PM BY PEAKY     Note: Gram Stain Report Called to,Read Back By and Verified With: Charlsie MerlesERESA O LEARY 07/29/13 1405 BY SMITHERSJ     Performed at [facility]   Report Status 08/05/2013 FINAL   Final  CULTURE, BLOOD (ROUTINE X 2)     Status: None   Collection Time    07/28/13   7:25 PM      Result Value Range Status  Specimen Description BLOOD LEFT ANTECUBITAL   Final   Special Requests NONE BOTTLES DRAWN AEROBIC AND ANAEROBIC 10CC EACH   Final   Culture  Setup Time     Final   Value: 07/29/2013 01:25     Performed at [facility]   Culture     Final   Value: GROUP A STREP (S.PYOGENES) ISOLATED     5 Note: FAXED TO [facility] 1 15 438PM BYPEAKY     Note: Gram Stain Report Called to,Read Back By and Verified With: Charlsie Merles 07/29/13 1405 BY [email or web address]     Performed at [facility]   Report Status 08/05/2013 FINAL   Final  URINE CULTURE     Status: None   Collection Time    07/29/13 12:43 AM      Result Value Range Status   Specimen Description URINE, RANDOM   Final   Special Requests NONE   Final   Culture  Setup Time     Final   Value: 07/29/2013 08:59     Performed at [facility]   Colony Count     Final   Value: NO GROWTH     Performed at [facility]   Culture     Final   Value: NO GROWTH     Performed at [facility]   Report Status 07/30/2013 FINAL   Final     Labs: Basic Metabolic Panel:  Recent Labs Lab 08/01/13 0440  08/03/13 0530 08/04/13 0432 08/05/13 0515 08/06/13 0230 08/07/13 0525  NA 130*  < > 136* 140 136* 134* 133*  K 2.9*  < > 3.5* 3.4* 3.6* 4.7 5.0  CL 88*  < > 93* 98 95* 97 97  CO2 23  < > 28 29 26 24 21   GLUCOSE 149*  < > 90 89 122* 83 74  BUN 85*  < > 93* 85* 63* 46* 33*  CREATININE 5.74*  < > 5.18* 4.27* 3.29* 2.74* 2.35*  CALCIUM 6.5*  < > 7.6* 7.8* 7.9* 7.8* 8.2*  MG 2.1  --   --   --  1.9  --   --   PHOS  --   --   --  4.1 3.9 3.8  --   < > = values in this interval not displayed. Liver Function Tests:  Recent Labs Lab 08/04/13 0432 08/05/13 0515 08/06/13 0230  ALBUMIN 1.6* 1.6* 1.8*   No results found for this basename: LIPASE, AMYLASE,  in the last 168 hours No results found for this basename: AMMONIA,  in the last 168  hours CBC:  Recent Labs Lab 08/01/13 1517 08/02/13 0500 08/03/13 0530 08/04/13 0432 08/05/13 0515 08/06/13 0230 08/07/13 0525  WBC  --  30.0* 30.2* 21.4* 23.8* 27.1* 28.9*  NEUTROABS 28.9* 27.8* 26.6*  --  20.8*  --   --   HGB  --  12.5* 11.6* 10.9* 10.9* 11.4* 10.9*  HCT  --  34.1* 32.0* 30.6* 31.3* 32.9* 31.5*  MCV  --  82.8 84.2 84.8 86.9 87.0 88.0  PLT  --  124* 130* 157 201 252 328   Cardiac Enzymes:  Recent Labs Lab 07/31/13 1625 08/07/13 0525 08/07/13 0835  CKTOTAL  --   --  29  CKMB  --   --  1.3  TROPONINI >20.00* 0.45* 0.37*   BNP: BNP (last 3 results)  Recent Labs  07/28/13 1911  PROBNP 500.6*   CBG:  Recent Labs  Lab 08/02/13 0741 08/02/13 1202 08/02/13 1951 08/02/13 2342 08/03/13 0332  GLUCAP 139* 158* 115* 94 90       Signed:  Calvert Cantor, MD  [facility] 08/07/2013, 1:12 PM

## 2013-08-07 NOTE — Progress Notes (Signed)
Patient discharged home with wife. Patient was given prescriptions and discharge education. Patient was told to call MD with a temperature and any questions/concerns. Patient was stable upon discharge.

## 2013-08-07 NOTE — Progress Notes (Signed)
CRITICAL VALUE ALERT  Critical value received: troponin 0.45 Date of notification:  08/07/13  Time of notification:  730  Critical value read back:yes  Nurse who received alert:  Morrie Sheldonashley  MD notified (1st page):  Rizwan  Time of first page:  [phone]  MD notified (2nd page):  Time of second page:  Responding MD:  Butler Denmarkizwan  Time MD responded:  745

## 2013-08-08 ENCOUNTER — Other Ambulatory Visit: Payer: Self-pay | Admitting: Internal Medicine

## 2013-08-08 ENCOUNTER — Telehealth: Payer: Self-pay | Admitting: *Deleted

## 2013-08-08 DIAGNOSIS — B950 Streptococcus, group A, as the cause of diseases classified elsewhere: Secondary | ICD-10-CM

## 2013-08-08 DIAGNOSIS — N179 Acute kidney failure, unspecified: Secondary | ICD-10-CM

## 2013-08-08 NOTE — Telephone Encounter (Signed)
Called patient per Dr Drue SecondSnider to have him come in for labs (cbc/cmp) prior to his visit. He is coming 08/12/13. Patient also advised he was told to call the office if his temp went above 100. He reports it is at 100.7 and his pulse is 120-130. Advised he feels fine no chest pain or shortness of breath. Advised him I will let the doctor know and if he needs to do anything will call him back. But if it goes above 101 to definitely call back.

## 2013-08-12 ENCOUNTER — Other Ambulatory Visit: Payer: BC Managed Care – PPO

## 2013-08-12 DIAGNOSIS — B950 Streptococcus, group A, as the cause of diseases classified elsewhere: Secondary | ICD-10-CM

## 2013-08-12 DIAGNOSIS — N179 Acute kidney failure, unspecified: Secondary | ICD-10-CM

## 2013-08-12 LAB — CBC WITH DIFFERENTIAL/PLATELET
BASOS PCT: 0 % (ref 0–1)
Basophils Absolute: 0.1 10*3/uL (ref 0.0–0.1)
EOS ABS: 0.1 10*3/uL (ref 0.0–0.7)
EOS PCT: 1 % (ref 0–5)
HCT: 32.9 % — ABNORMAL LOW (ref 39.0–52.0)
HEMOGLOBIN: 11 g/dL — AB (ref 13.0–17.0)
LYMPHS ABS: 1.4 10*3/uL (ref 0.7–4.0)
Lymphocytes Relative: 11 % — ABNORMAL LOW (ref 12–46)
MCH: 30.1 pg (ref 26.0–34.0)
MCHC: 33.4 g/dL (ref 30.0–36.0)
MCV: 90.1 fL (ref 78.0–100.0)
MONOS PCT: 7 % (ref 3–12)
Monocytes Absolute: 0.9 10*3/uL (ref 0.1–1.0)
Neutro Abs: 10.5 10*3/uL — ABNORMAL HIGH (ref 1.7–7.7)
Neutrophils Relative %: 81 % — ABNORMAL HIGH (ref 43–77)
PLATELETS: 742 10*3/uL — AB (ref 150–400)
RBC: 3.65 MIL/uL — ABNORMAL LOW (ref 4.22–5.81)
RDW: 13.8 % (ref 11.5–15.5)
WBC: 13 10*3/uL — ABNORMAL HIGH (ref 4.0–10.5)

## 2013-08-12 LAB — BASIC METABOLIC PANEL WITH GFR
BUN: 22 mg/dL (ref 6–23)
CO2: 24 meq/L (ref 19–32)
CREATININE: 1.66 mg/dL — AB (ref 0.50–1.35)
Calcium: 9 mg/dL (ref 8.4–10.5)
Chloride: 98 mEq/L (ref 96–112)
GFR, EST AFRICAN AMERICAN: 57 mL/min — AB
GFR, Est Non African American: 49 mL/min — ABNORMAL LOW
GLUCOSE: 86 mg/dL (ref 70–99)
Potassium: 4.9 mEq/L (ref 3.5–5.3)
Sodium: 135 mEq/L (ref 135–145)

## 2013-08-14 ENCOUNTER — Encounter: Payer: Self-pay | Admitting: Internal Medicine

## 2013-08-14 ENCOUNTER — Ambulatory Visit (INDEPENDENT_AMBULATORY_CARE_PROVIDER_SITE_OTHER): Payer: BC Managed Care – PPO | Admitting: Internal Medicine

## 2013-08-14 VITALS — BP 117/87 | HR 118 | Temp 98.3°F | Ht 71.0 in | Wt 200.8 lb

## 2013-08-14 DIAGNOSIS — L0390 Cellulitis, unspecified: Secondary | ICD-10-CM

## 2013-08-14 DIAGNOSIS — R6521 Severe sepsis with septic shock: Secondary | ICD-10-CM

## 2013-08-14 DIAGNOSIS — R7881 Bacteremia: Secondary | ICD-10-CM

## 2013-08-14 DIAGNOSIS — L0291 Cutaneous abscess, unspecified: Secondary | ICD-10-CM

## 2013-08-14 DIAGNOSIS — N179 Acute kidney failure, unspecified: Secondary | ICD-10-CM

## 2013-08-14 DIAGNOSIS — A419 Sepsis, unspecified organism: Secondary | ICD-10-CM

## 2013-08-14 MED ORDER — AMOXICILLIN 500 MG PO TABS: 500.0000 mg | ORAL_TABLET | Freq: Three times a day (TID) | ORAL | Status: AC

## 2013-08-14 NOTE — Progress Notes (Signed)
Patient ID: Andrew Hogan, male   DOB: 09/07/1970, 43 y.o.   MRN: [id number]         [facility]  Patient Active Problem List   Diagnosis Date Noted  . ARDS (adult respiratory distress syndrome) 07/31/2013  . Acute respiratory failure with hypoxia 07/31/2013  . Septic shock(785.52) 07/31/2013  . Cellulitis 07/29/2013  . Acute renal failure 07/29/2013  . Diarrhea 07/29/2013  . Bacteremia due to Gram-positive bacteria 07/29/2013  . Severe sepsis(995.92) 07/29/2013  . Dehydration 07/29/2013  . Metabolic acidosis 07/29/2013  . Hypokalemia 07/29/2013  . Rhabdomyolysis 07/29/2013  . Fatty infiltration of liver 07/29/2013  . Chest wall pain 07/28/2013    Patient's Medications  New Prescriptions   No medications on file  Previous Medications   ATORVASTATIN (LIPITOR) 20 MG TABLET    Take 1 tablet (20 mg total) by mouth daily at 6 PM.   LIDOCAINE (LIDODERM) 5 %    Place 1 patch onto the skin daily. Remove & Discard patch within 12 hours or as directed by MD   OXYCODONE (OXY IR/ROXICODONE) 5 MG IMMEDIATE RELEASE TABLET    Take 1 tablet (5 mg total) by mouth every 4 (four) hours as needed for severe pain.  Modified Medications   Modified Medication Previous Medication   AMOXICILLIN (AMOXIL) 500 MG TABLET amoxicillin (AMOXIL) 500 MG tablet      Take 1 tablet (500 mg total) by mouth 3 (three) times daily.    Take 1 tablet (500 mg total) by mouth 3 (three) times daily.  Discontinued Medications   No medications on file    Subjective: Mr. Andrew Hogan is in for his hospital followup visit. He was admitted last month with severe sepsis and multiorgan failure related to group A streptococcal cellulitis and myositis of his right flank and bacteremia. He had transient ventilator dependent respiratory failure and acute kidney injury. He improved slowly and was discharged one week ago. He is now completed 18 days of antibiotic therapy and is currently on amoxicillin. He is feeling much  better but still has some soreness in his right flank, malaise, palpitations and vague chest discomfort. He had some low-grade fever just after discharge but this resolved.  Review of Systems: Pertinent items are noted in HPI.  No past medical history on file.  History  Substance Use Topics  . Smoking status: Never Smoker   . Smokeless tobacco: Not on file  . Alcohol Use: No    No family history on file.  No Known Allergies  Objective: Temp: 98.3 F (36.8 C) (01/15 1504) Temp src: Oral (01/15 1504) BP: 117/87 mmHg (01/15 1510) Pulse Rate: 118 (01/15 1510)  General: He is in no distress Skin: He has some superficial desquamation of skin over his right flank with some scabbing. The area is still mildly tender to palpation without any clear-cut signs of any deep infection Lungs: Clear Cor: Tachycardic but regular S1 and S2 with no murmurs Abdomen: Soft and nontender Joints and extremities: Normal  Lab Results Lab Results  Component Value Date   WBC 13.0* 08/12/2013   HGB 11.0* 08/12/2013   HCT 32.9* 08/12/2013   MCV 90.1 08/12/2013   PLT 742* 08/12/2013   CMP     Component Value Date/Time   NA 135 08/12/2013 1100   K 4.9 08/12/2013 1100   CL 98 08/12/2013 1100   CO2 24 08/12/2013 1100   GLUCOSE 86 08/12/2013 1100   BUN 22 08/12/2013 1100   CREATININE 1.66* 08/12/2013  1100   CREATININE 2.35* 08/07/2013 0525   CALCIUM 9.0 08/12/2013 1100   PROT 5.1* 07/31/2013 0425   ALBUMIN 1.8* 08/06/2013 0230   AST 69* 07/31/2013 0425   ALT 39 07/31/2013 0425   ALKPHOS 87 07/31/2013 0425   BILITOT 2.2* 07/31/2013 0425   GFRNONAA 32* 08/07/2013 0525   GFRAA 37* 08/07/2013 0525      Assessment: He has severe sepsis and toxic shock syndrome due to group A streptococcal infection. He is improving. He will take amoxicillin for 3 more days to complete a three-week total course of therapy. He needs repeat CBC and complete metabolic panel but prefers to have it done closer to home at his primary care  physician's office.  He states that he was started on atorvastatin while hospitalized but I cannot find out who started or what the indication was. He is still tachycardic but no specific cardiac followup or further workup as indicated by Dr. Delane Ginger in his last note. I would suggest simply observing his overall condition for now. Expect that he will continue to have slow resolution of his symptoms over the next month.  Plan: 1. Take amoxicillin for 3 more days then stop and observe off of antibiotics 2. Recommend CBC and complete metabolic panel within the next 3-4 days at Dr. Kipp Brood Burnett's office 3. Followup here in 2 weeks   Cliffton Asters, MD [facility] [phone] pager   [phone] cell 08/14/2013, 3:34 PM

## 2013-08-28 ENCOUNTER — Ambulatory Visit (INDEPENDENT_AMBULATORY_CARE_PROVIDER_SITE_OTHER): Payer: BC Managed Care – PPO | Admitting: Internal Medicine

## 2013-08-28 ENCOUNTER — Encounter: Payer: Self-pay | Admitting: Internal Medicine

## 2013-08-28 VITALS — BP 115/80 | HR 130 | Temp 98.8°F | Wt 197.0 lb

## 2013-08-28 DIAGNOSIS — R7881 Bacteremia: Secondary | ICD-10-CM

## 2013-08-28 NOTE — Progress Notes (Signed)
Patient ID: Andrew LimaWen Jing Hogan, male   DOB: 09/23/1970, 43 y.o.   MRN: [id number]         [facility]  Patient Active Problem List   Diagnosis Date Noted  . ARDS (adult respiratory distress syndrome) 07/31/2013  . Acute respiratory failure with hypoxia 07/31/2013  . Septic shock(785.52) 07/31/2013  . Cellulitis 07/29/2013  . Acute renal failure 07/29/2013  . Diarrhea 07/29/2013  . Bacteremia due to Gram-positive bacteria 07/29/2013  . Severe sepsis(995.92) 07/29/2013  . Dehydration 07/29/2013  . Metabolic acidosis 07/29/2013  . Hypokalemia 07/29/2013  . Rhabdomyolysis 07/29/2013  . Fatty infiltration of liver 07/29/2013  . Chest wall pain 07/28/2013    Patient's Medications  New Prescriptions   No medications on file  Previous Medications   ATORVASTATIN (LIPITOR) 20 MG TABLET    Take 1 tablet (20 mg total) by mouth daily at 6 PM.   LIDOCAINE (LIDODERM) 5 %    Place 1 patch onto the skin daily. Remove & Discard patch within 12 hours or as directed by MD   OXYCODONE (OXY IR/ROXICODONE) 5 MG IMMEDIATE RELEASE TABLET    Take 1 tablet (5 mg total) by mouth every 4 (four) hours as needed for severe pain.  Modified Medications   No medications on file  Discontinued Medications   No medications on file    Subjective: Andrew Hogan is in for his routine followup visit. He completed 3 weeks of an aquatic therapy on January 18. He is feeling better. He continues to have some occasional right flank discomfort but this has improved. He has not had any fever, chills or sweats. He does continue to notice tachycardia. Review of Systems: Pertinent items are noted in HPI.  No past medical history on file.  History  Substance Use Topics  . Smoking status: Never Smoker   . Smokeless tobacco: Not on file  . Alcohol Use: No    No family history on file.  No Known Allergies  Objective: Temp: 98.8 F (37.1 C) (01/29 1625) Temp src: Oral (01/29 1625) BP: 115/80 mmHg  (01/29 1625) Pulse Rate: 130 (01/29 1625)  General: He is in no distress Skin: There are some patches of brown, hyperpigmented skin on his right flank are slowly improving Lungs: Clear Cor: Tachycardic but regular S1 and S2 with no murmurs Abdomen: Soft and nontender  Lab Results Creatinine 08/18/2013: 1.4   Assessment: I believe that his recent, severe group A strep infection has been cured. He does have persistent tachycardia but had no clear echocardiographic abnormalities while hospitalized. No cardiology followup was recommended while he was hospitalized but I would consider further evaluation if the tachycardia persists more than a few more weeks. His renal insufficiency continues to improve.   Plan: 1. Continue observation off of antibiotics 2. Followup here as needed   Cliffton AstersJohn Quamaine Webb, MD [facility] [phone] pager   [phone] cell 08/28/2013, 5:02 PM

## 2015-06-08 IMAGING — CR DG ABDOMEN 1V
1 series · 1 of 1 positions shown · non-contrast
Comparison: CT 07/29/2013.

CLINICAL DATA: Pain.

EXAM:
ABDOMEN - 1 VIEW

[t abdomen supine]
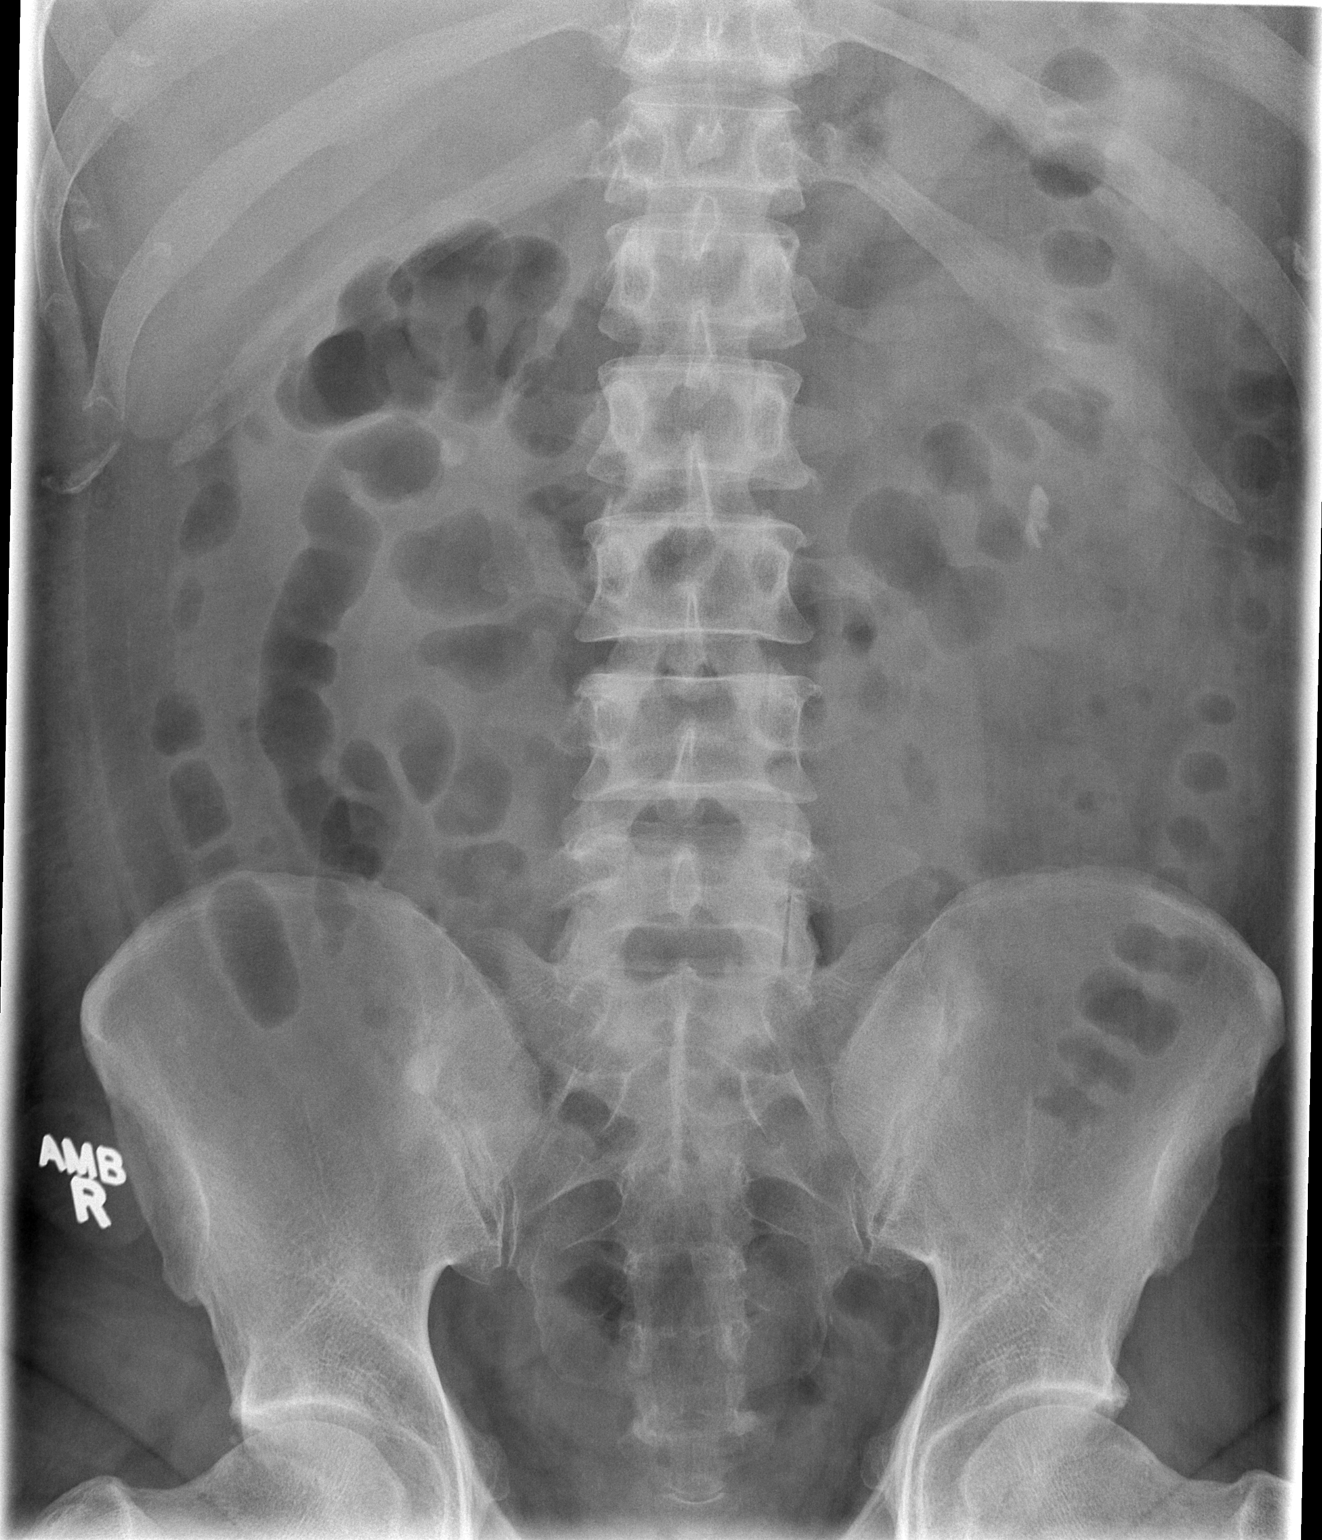

[1 of 1 positions shown; findings below may reference images not displayed]

FINDINGS: As noted on CT diffuse right side secondary soft tissue swelling is
present. Air-filled nondilated loops of small and large bowel noted.
This is a nonspecific finding. Bilateral nephrolithiasis. No acute
bony abnormality. Sclerotic density noted in the right ilium most
consistent with bone island. No other sclerotic bony densities noted
to suggest metastatic disease.
IMPRESSION: 1. Soft tissue swelling of the right abdomen subcutaneous tissue,
reference made to recent CT report.
2. Bilateral nephrolithiasis.
3. Nonspecific nondistended air-filled loops of small large bowel
noted.
4. Sclerotic density node in the right iliac wing consistent with
bone island .

## 2015-06-08 IMAGING — DX DG CHEST 1V PORT
1 series · 1 of 1 positions shown · non-contrast
Comparison: 07/28/2013

CLINICAL DATA: Shortness of breath

EXAM:
PORTABLE CHEST - 1 VIEW

[portable]
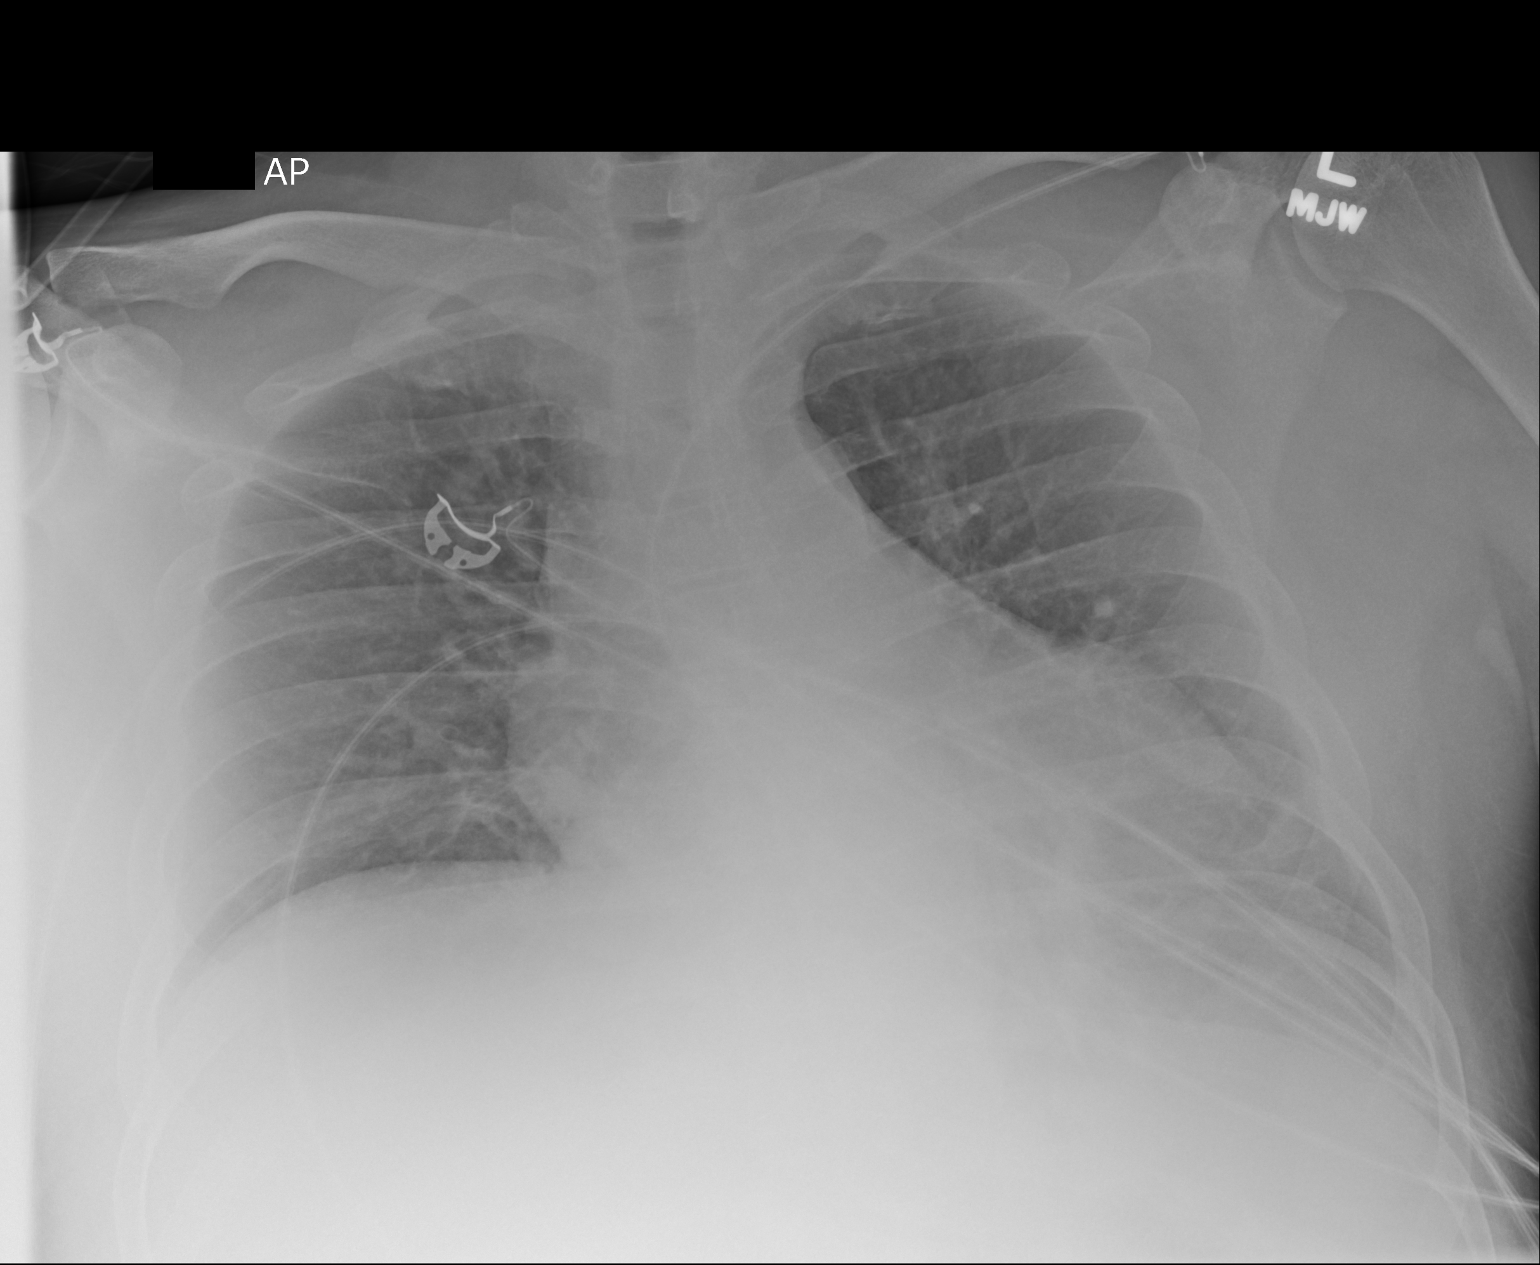

[1 of 1 positions shown; findings below may reference images not displayed]

FINDINGS: The heart size and mediastinal contours are within normal limits.
Both lungs are clear. The visualized skeletal structures are
unremarkable.
IMPRESSION: No active disease.

## 2015-06-09 IMAGING — CR DG CHEST 1V PORT
1 series · 1 of 1 positions shown · non-contrast
Comparison: Earlier the same date.

CLINICAL DATA: Intubation.

EXAM:
PORTABLE CHEST - 1 VIEW

[AP]
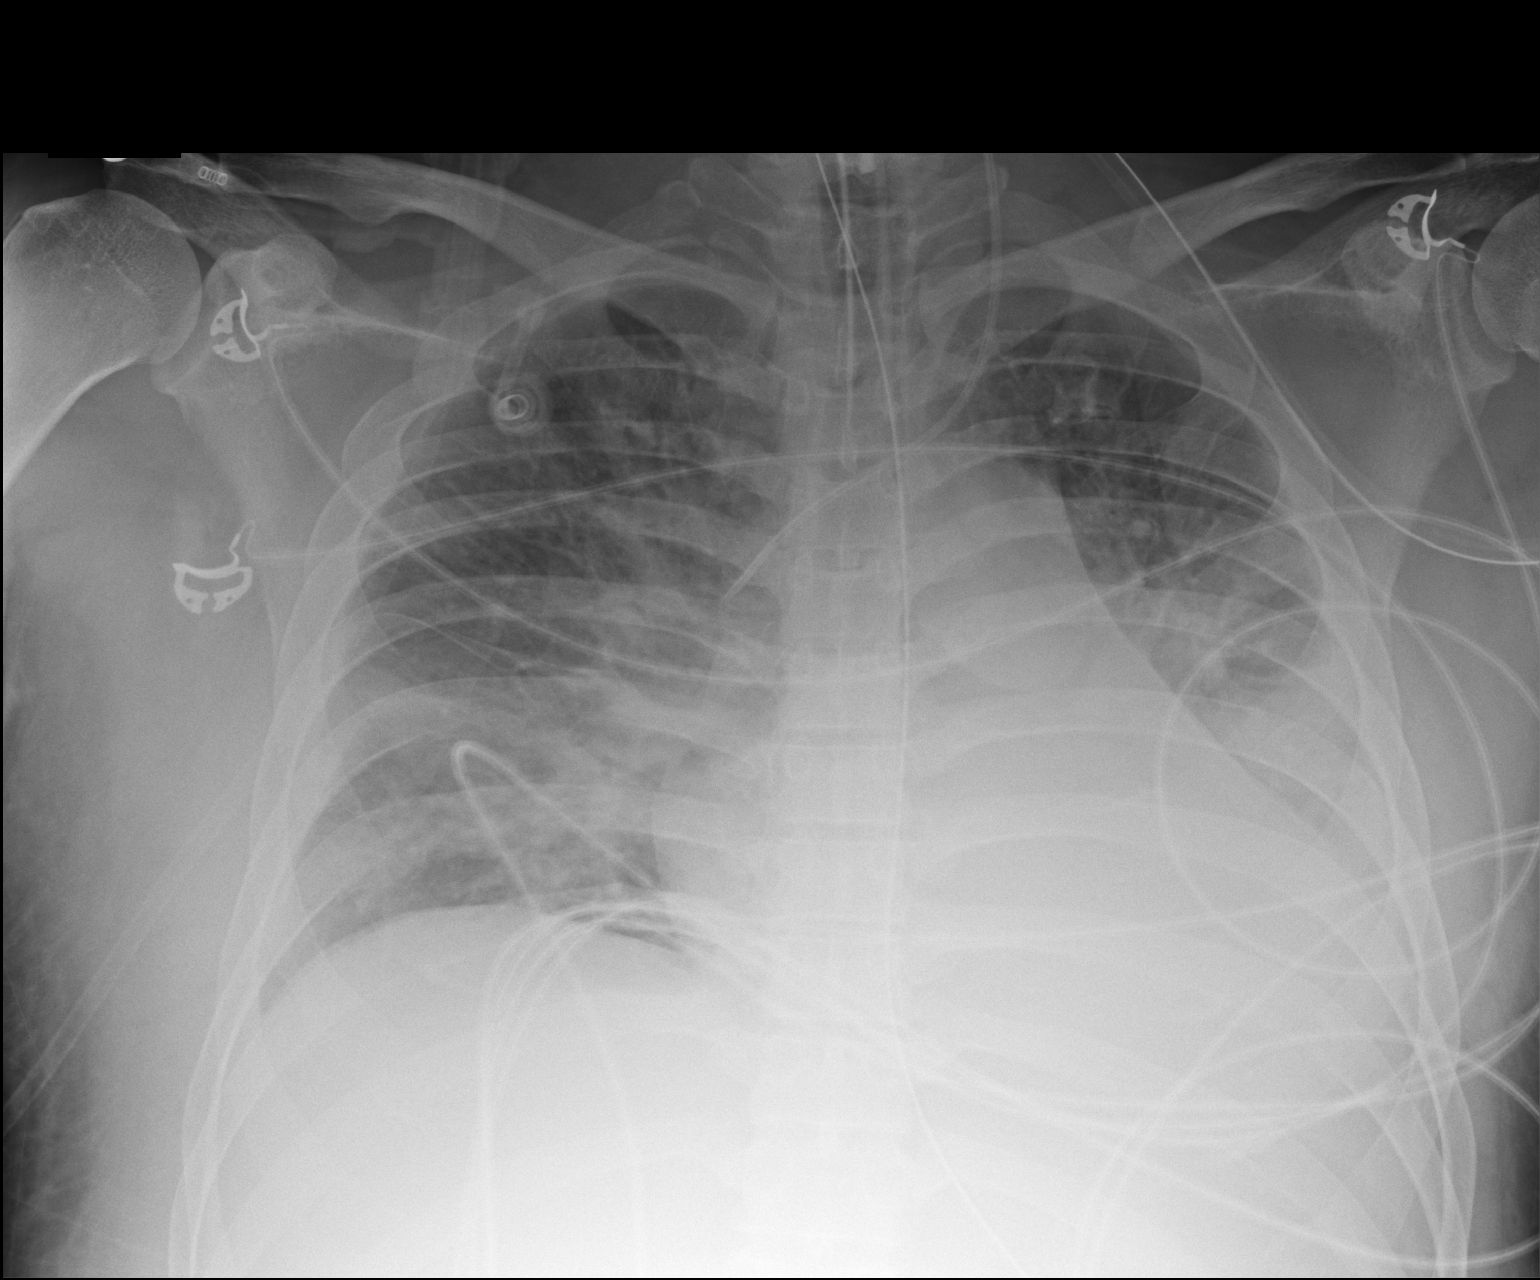

[1 of 1 positions shown; findings below may reference images not displayed]

FINDINGS: 9097 hr. The endotracheal tube is well positioned in the midtrachea.
A nasogastric tube projects below the diaphragm. Left IJ central
venous catheter is unchanged at the level of the mid SVC. There is
persistent pulmonary edema with increased opacity at the left lung
base, likely due to a combination of pleural fluid and basilar
airspace disease. No pneumothorax is evident. The heart size and
mediastinal contours are stable.
IMPRESSION: Satisfactory position of the newly placed endotracheal and
nasogastric tubes. Increased left basilar airspace disease and
probable adjacent pleural fluid.

## 2015-06-12 IMAGING — CR DG CHEST 1V PORT
1 series · 1 of 1 positions shown · non-contrast
Comparison: Portable chest 08/02/2013.  CT 07/31/2013.

CLINICAL DATA: Extubation.

EXAM:
PORTABLE CHEST - 1 VIEW

[AP]
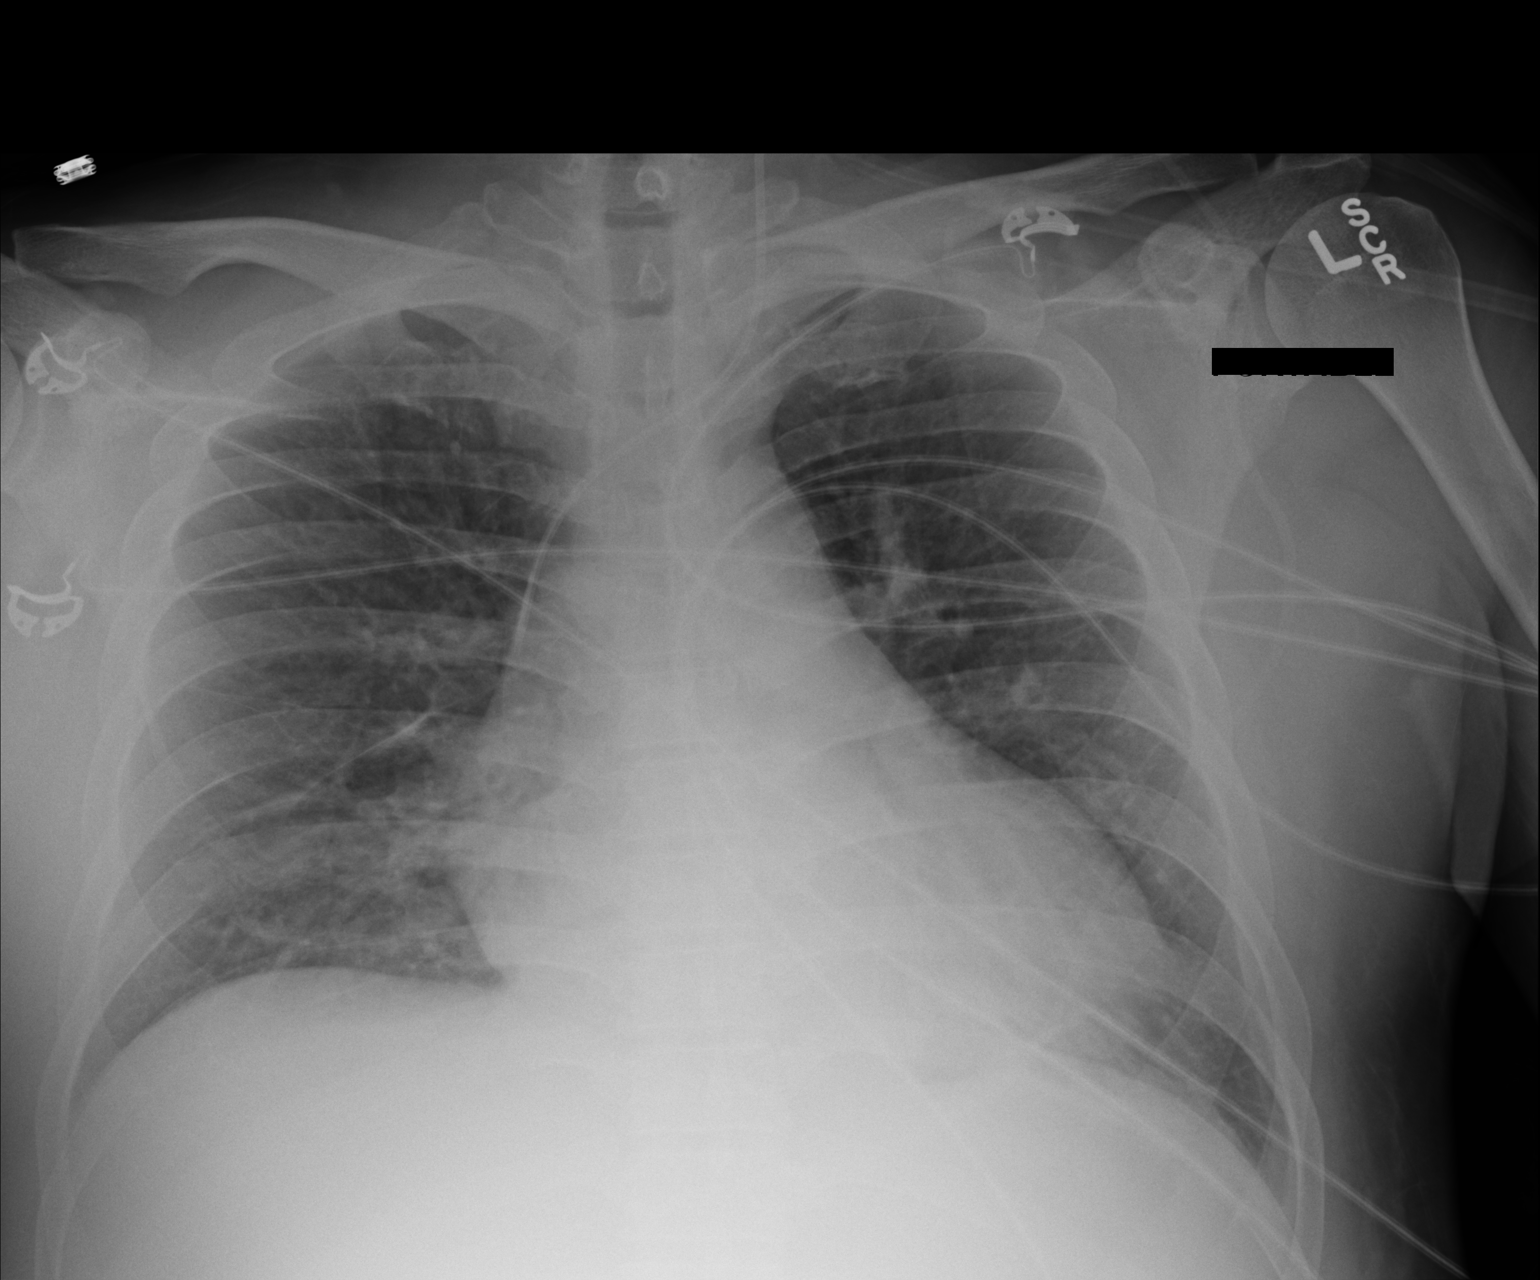

[1 of 1 positions shown; findings below may reference images not displayed]

FINDINGS: 5579 hr. Interval extubation with removal of the nasogastric tube.
The left IJ central venous catheter is unchanged at the lower SVC
level. There is improved aeration of the lungs with resolution of
edema. Minimal left lower lobe atelectasis remains. There is no
significant pleural effusion or pneumothorax. Mild cardiomegaly is
stable.
IMPRESSION: Significantly improved pulmonary aeration with resolution of edema
following extubation. No acute findings demonstrated.

## 2016-03-04 ENCOUNTER — Encounter (HOSPITAL_BASED_OUTPATIENT_CLINIC_OR_DEPARTMENT_OTHER): Payer: Self-pay

## 2016-03-04 ENCOUNTER — Emergency Department (HOSPITAL_BASED_OUTPATIENT_CLINIC_OR_DEPARTMENT_OTHER)
Admission: EM | Admit: 2016-03-04 | Discharge: 2016-03-04 | Disposition: A | Discharge status: Home / Self Care | Payer: BLUE CROSS/BLUE SHIELD | Attending: Emergency Medicine | Admitting: Emergency Medicine

## 2016-03-04 DIAGNOSIS — M25561 Pain in right knee: Secondary | ICD-10-CM | POA: Diagnosis present

## 2016-03-04 DIAGNOSIS — M545 Low back pain, unspecified: Secondary | ICD-10-CM

## 2016-03-04 DIAGNOSIS — M25461 Effusion, right knee: Secondary | ICD-10-CM | POA: Diagnosis not present

## 2016-03-04 HISTORY — DX: Gout, unspecified: M10.9

## 2016-03-04 MED ORDER — ORPHENADRINE CITRATE ER 100 MG PO TB12: 100.0000 mg | ORAL_TABLET | Freq: Two times a day (BID) | ORAL | 0 refills | Status: DC | PRN

## 2016-03-04 MED ORDER — INDOMETHACIN 50 MG PO CAPS: 50.0000 mg | ORAL_CAPSULE | Freq: Three times a day (TID) | ORAL | 0 refills | Status: DC

## 2016-03-04 NOTE — Discharge Instructions (Signed)
Medications: Indomethacin, Norflex  Treatment: Take indomethacin 3 times daily until you are seen by your primary care provider on Monday. Take Norflex twice daily as needed for your muscle spasms. Do not drive or operate machinery when taking this medication. Try the back exercises attached as tolerated. Use moist heat 3-4 times daily alternating 20 minutes on, 20 minutes off.  Follow-up: Please follow-up with your primary care provider on Monday for follow-up of today's visit and further evaluation of your knee and back pain. Please return to emergency department if you develop any new or worsening symptoms including fever, increasing pain, redness, warmth, inability to bend your knee.

## 2016-03-04 NOTE — ED Triage Notes (Signed)
Patient presents to ED with right knee pain after a back injury that occurred two days ago. Due to patient walking differently, his right knee began having increasing pain due to his history of gout.

## 2016-03-04 NOTE — ED Provider Notes (Signed)
[facility] Provider Note   CSN: [id number] Arrival date & time: 03/04/16  1019  First Provider Contact:  First MD Initiated Contact with Patient 03/04/16 1058        History   Chief Complaint Chief Complaint  Patient presents with  . Knee Pain    HPI Andrew Hogan is a 45 y.o. male with history of gout who presents with a one-week history of right knee pain. Patient states his knee pain feels exactly like his past gout flareup to his right knee.Patient is treated with allopurinol by his PCP. Patient admits to not taking his allopurinol as prescribed he has he "does not want to rely on it." Patient states he has had intermittent flareups for the last few years to his right knee. Patient describes his pain as a stabbing pain to his right lateral knee. Patient has pain with weightbearing and walking. He has minimal pain at rest. He denies any new injury or trauma to his knee. Patient has not been taking any medications at home. Patient also reports right sided low back pain and spasm after lifting heavy objects 2 days ago. His pain does not radiate and is worse with movement. Patient has a history of low back pain. Patient has not tried any treatments at home for this. Patient denies any fevers, chest pain, shortness of breath, abdominal pain, nausea, vomiting, penile discharge, recent surgery to back, saddle anesthesia, bowel/bladder incontinence, known cancer, extremity numbness/paresthesias or urinary symptoms.  HPI  Past Medical History:  Diagnosis Date  . Gout     Patient Active Problem List   Diagnosis Date Noted  . ARDS (adult respiratory distress syndrome) (HCC) 07/31/2013  . Acute respiratory failure with hypoxia (HCC) 07/31/2013  . Septic shock(785.52) 07/31/2013  . Cellulitis 07/29/2013  . Acute renal failure (HCC) 07/29/2013  . Diarrhea 07/29/2013  . Bacteremia due to Gram-positive bacteria 07/29/2013  . Severe sepsis(995.92) 07/29/2013  . Dehydration  07/29/2013  . Metabolic acidosis 07/29/2013  . Hypokalemia 07/29/2013  . Rhabdomyolysis 07/29/2013  . Fatty infiltration of liver 07/29/2013  . Chest wall pain 07/28/2013    No past surgical history on file.     Home Medications    Prior to Admission medications   Medication Sig Start Date End Date Taking? Authorizing Provider  atorvastatin (LIPITOR) 20 MG tablet Take 1 tablet (20 mg total) by mouth daily at 6 PM. 08/07/13   Calvert Cantor, MD  indomethacin (INDOCIN) 50 MG capsule Take 1 capsule (50 mg total) by mouth 3 (three) times daily with meals. 03/04/16   Waylan Boga Coretha Creswell, PA-C  lidocaine (LIDODERM) 5 % Place 1 patch onto the skin daily. Remove & Discard patch within 12 hours or as directed by MD 08/07/13   Calvert Cantor, MD  orphenadrine (NORFLEX) 100 MG tablet Take 1 tablet (100 mg total) by mouth 2 (two) times daily as needed for muscle spasms. 03/04/16   Emi Holes, PA-C  oxyCODONE (OXY IR/ROXICODONE) 5 MG immediate release tablet Take 1 tablet (5 mg total) by mouth every 4 (four) hours as needed for severe pain. 08/07/13   Calvert Cantor, MD    Family History No family history on file.  Social History Social History  Substance Use Topics  . Smoking status: Never Smoker  . Smokeless tobacco: Never Used  . Alcohol use Yes     Comment: soically     Allergies   Review of patient's allergies indicates no known allergies.   Review of Systems  Review of Systems  Constitutional: Negative for chills and fever.  HENT: Negative for facial swelling.   Respiratory: Negative for shortness of breath.   Cardiovascular: Negative for chest pain.  Gastrointestinal: Negative for abdominal pain, nausea and vomiting.  Genitourinary: Negative for dysuria.  Musculoskeletal: Positive for arthralgias, back pain (R sided) and joint swelling.  Skin: Negative for rash and wound.  Neurological: Negative for headaches.  Psychiatric/Behavioral: The patient is not nervous/anxious.       Physical Exam Updated Vital Signs BP (!) 123/101 (BP Location: Right Arm)   Pulse 70   Temp 98.2 F (36.8 C) (Oral)   Resp 18   Ht  (1.803 m)   Wt 95.3 kg   SpO2 97%   BMI 29.29 kg/m   Physical Exam  Constitutional: He appears well-developed and well-nourished. No distress.  HENT:  Head: Normocephalic and atraumatic.  Mouth/Throat: Oropharynx is clear and moist. No oropharyngeal exudate.  Eyes: Conjunctivae are normal. Pupils are equal, round, and reactive to light. Right eye exhibits no discharge. Left eye exhibits no discharge. No scleral icterus.  Neck: Normal range of motion. Neck supple. No thyromegaly present.  Cardiovascular: Normal rate, regular rhythm, normal heart sounds and intact distal pulses.  Exam reveals no gallop and no friction rub.   No murmur heard. Pulmonary/Chest: Effort normal and breath sounds normal. No stridor. No respiratory distress. He has no wheezes. He has no rales.  Abdominal: Soft. Bowel sounds are normal. He exhibits no distension. There is no tenderness. There is no rebound and no guarding.  Musculoskeletal: He exhibits edema (R knee, mild).       Lumbar back: He exhibits tenderness (r sided only), pain and spasm. He exhibits no bony tenderness.       Back:  No midline TTP to cervical to lumbar spine; 5/5 strength in lower extremities; normal sensation; negative SLR bilaterally R knee: Mild warmth and swelling in comparison to the left knee; anterior lateral tenderness to palpation; full range of motion of joint without pain; negative anterior/posterior drawer; negative McMurray; negative varus and valgus stress test  Lymphadenopathy:    He has no cervical adenopathy.  Neurological: He is alert. Coordination normal.  Skin: Skin is warm and dry. No rash noted. He is not diaphoretic. No pallor.  Psychiatric: He has a normal mood and affect.  Nursing note and vitals reviewed.    ED Treatments / Results  Labs (all labs ordered are  listed, but only abnormal results are displayed) Labs Reviewed - No data to display  EKG  EKG Interpretation None       Radiology No results found.  Procedures Procedures (including critical care time)  Medications Ordered in ED Medications - No data to display   Initial Impression / Assessment and Plan / ED Course  I have reviewed the triage vital signs and the nursing notes.  Pertinent labs & imaging results that were available during my care of the patient were reviewed by me and considered in my medical decision making (see chart for details).  Clinical Course      Final Clinical Impressions(s) / ED Diagnoses   Final diagnoses:  Right knee pain  Right-sided low back pain without sciatica   Pt presents with monoarticular pain, swelling and erythema.  Pt is afebrile and stable. Renal function good (2015 elevated creatinine resolved according to patient). Pt without known peptic ulcer disease and not receiving concurrent treatment on warfarin. Pt dc with indomethacin (50 mg PO TID). Patient also  with right-sided low back pain, midline TTP.  No neurological deficits and normal neuro exam.  Patient is ambulatory.  No loss of bowel or bladder control.  No concern for cauda equina.  No fever, night sweats, weight loss, h/o cancer, IVDA, no recent procedure to back. No urinary symptoms suggestive of UTI.  Supportive care and return precaution discussed. Patient discharged with Norflex for muscle spasms. Appears safe for discharge at this time. Follow up with PCP on Monday for further evaluation and follow-up and possible intra-articular injection as patient has received in past. I discussed patient case with Dr. Fredderick Phenix who is in agreement with plan. Patient vitals stable throughout ED course and discharged in satisfactory condition.   New Prescriptions New Prescriptions   INDOMETHACIN (INDOCIN) 50 MG CAPSULE    Take 1 capsule (50 mg total) by mouth 3 (three) times daily with  meals.   ORPHENADRINE (NORFLEX) 100 MG TABLET    Take 1 tablet (100 mg total) by mouth 2 (two) times daily as needed for muscle spasms.     [address] PA-C 03/04/16 1732    Rolan Bucco, MD 03/04/16 1739

## 2018-06-18 ENCOUNTER — Other Ambulatory Visit: Payer: Self-pay | Admitting: Urology

## 2018-06-19 ENCOUNTER — Encounter (HOSPITAL_COMMUNITY): Payer: Self-pay | Admitting: General Practice

## 2018-06-19 ENCOUNTER — Other Ambulatory Visit: Payer: Self-pay | Admitting: Urology

## 2018-06-21 NOTE — H&P (Signed)
Office Visit Report     06/17/2018   --------------------------------------------------------------------------------   Andrew Hogan  MRN: [id number]  PRIMARY CARE:  Terrill Mohr, MD  DOB: 11/06/1970, 47 year old Male  REFERRING:  Terrill Mohr, MD  SSN:   PROVIDER:  Irine Seal, M.D.    TREATING:  Jiles Crocker    LOCATION:  [facility] [phone]   --------------------------------------------------------------------------------   CC: I have a history of kidney stones.  HPI: Andrew Hogan is a 47 year-old male established patient who is here for F/U due to a history of renal calculi.  06/10/18: Mr. Achor is a 47 yo male who had the onset on 06/07/18 of left flank pain. The pain was severe. He had no hematuria. He had nausea with the pain. He was seen in [facility] and a CT showed a 60m proximal stone and bilateral renal stones. He has no prior history of stones or other GU issues by report but had bilateral renal stones on a CT in 2015 during an admission for sepsis.   06/17/18: Back today for f/u KUB. He is being followed for interval passage of a left distal ureteral calculus. Denies interval passage of stone material. His symptoms have subsided. Not having pain/discomfort, bothersome LUTS. He denies fevers.   Hypercalciuria profile done at last visit shower creatinine of 1.8 and uric acid level of 9.8.   The patient was last seen 06/10/2018. The patient's stone was on his left side. He did not pass a stone since the last office visit.   The patient has not had any flank pain since they were last seen. The patient denies any progressive voiding symptoms. He has no seen blood in his urine since the last visit. He is not currently having flank pain, back pain, groin pain, nausea, vomiting, fever or chills. He has never had surgical treatment for calculi in the past.     ALLERGIES: None    MEDICATIONS: Tamsulosin Hcl 0.4 mg capsule 1 capsule PO Daily  Ondansetron Hcl 1 PO  Daily     GU PSH: None   NON-GU PSH: None   GU PMH: Renal calculus, Bilateral renal stones without obstruction but with growth since 2015. He will need a metabolic w/u going forward when he has cleared the ureteral stone. Hypercalcuria profile today. He will probably need a left PCNL and right ESWL at some point. - 06/10/2018 Ureteral calculus, 6 x 370mleft distal stone that has progressed from the proximal ureter. I discussed MET, ESWL and URS and will have him return in 1-2 weeks for a KUB. - 06/10/2018    NON-GU PMH: Gout    FAMILY HISTORY: Death - Father    Notes: Mother still living at age 89Father deceased at 819 SOCIAL HISTORY: Marital Status: Married Preferred Language: English; Ethnicity: Not Hispanic Or Latino; Race: Asian Current Smoking Status: Patient has never smoked.   Tobacco Use Assessment Completed: Used Tobacco in last 30 days? Has never drank.  Does not drink caffeine. Patient's occupation isArchitect   REVIEW OF SYSTEMS:    GU Review Male:   Patient denies frequent urination, hard to postpone urination, burning/ pain with urination, get up at night to urinate, leakage of urine, stream starts and stops, trouble starting your stream, have to strain to urinate , erection problems, and penile pain.  Gastrointestinal (Upper):   Patient denies nausea, vomiting, and indigestion/ heartburn.  Gastrointestinal (Lower):   Patient denies diarrhea  and constipation.  Constitutional:   Patient denies fever, night sweats, weight loss, and fatigue.  Skin:   Patient denies skin rash/ lesion and itching.  Eyes:   Patient denies blurred vision and double vision.  Ears/ Nose/ Throat:   Patient denies sore throat and sinus problems.  Hematologic/Lymphatic:   Patient denies swollen glands and easy bruising.  Cardiovascular:   Patient denies leg swelling and chest pains.  Respiratory:   Patient denies cough and shortness of breath.  Endocrine:   Patient denies  excessive thirst.  Musculoskeletal:   Patient denies back pain and joint pain.  Neurological:   Patient denies headaches and dizziness.  Psychologic:   Patient denies depression and anxiety.   VITAL SIGNS:      06/17/2018 08:56 AM  Weight 215 lb / 97.52 kg  Height 71 in / 180.34 cm  BP 125/84 mmHg  Pulse 73 /min  Temperature 98.4 F / 36.8 C  BMI 30.0 kg/m   MULTI-SYSTEM PHYSICAL EXAMINATION:    Constitutional: Well-nourished. No physical deformities. Normally developed. Good grooming.  Neck: Neck symmetrical, not swollen. Normal tracheal position.  Respiratory: Normal breath sounds. No labored breathing, no use of accessory muscles.   Cardiovascular: Regular rate and rhythm. No murmur, no gallop.   Skin: No paleness, no jaundice, no cyanosis. No lesion, no ulcer, no rash.  Neurologic / Psychiatric: Oriented to time, oriented to place, oriented to person. No depression, no anxiety, no agitation.  Gastrointestinal: No mass, no tenderness, no rigidity, non obese abdomen. No CVAT, flank or lower abdominal tenderness.  Musculoskeletal: Normal gait and station of head and neck.     PAST DATA REVIEWED:  Source Of History:  Patient  Records Review:   Previous Patient Records  Urine Test Review:   Urinalysis  X-Ray Review: KUB: Reviewed Films. Discussed With Patient.     06/17/18  Urinalysis  Urine Appearance Clear   Urine Color Yellow   Urine Glucose Neg mg/dL  Urine Bilirubin Neg mg/dL  Urine Ketones Neg mg/dL  Urine Specific Gravity 1.020   Urine Blood Neg ery/uL  Urine pH 6.0   Urine Protein Neg mg/dL  Urine Urobilinogen 0.2 mg/dL  Urine Nitrites Neg   Urine Leukocyte Esterase Neg leu/uL   PROCEDURES:         KUB - 76720  A single view of the abdomen is obtained. The previously identified distal left ureteral calculi has only descended slightly more than its position in the distal ureter on last imaging study. It is adjacent to the terminal end of the sacral body in the  left hemipelvis above the anatomical expected pos. of the left UVJ.               Urinalysis Dipstick Dipstick Cont'd  Color: Yellow Bilirubin: Neg mg/dL  Appearance: Clear Ketones: Neg mg/dL  Specific Gravity: 1.020 Blood: Neg ery/uL  pH: 6.0 Protein: Neg mg/dL  Glucose: Neg mg/dL Urobilinogen: 0.2 mg/dL    Nitrites: Neg    Leukocyte Esterase: Neg leu/uL    ASSESSMENT:      ICD-10 Details  1 GU:   Renal and ureteral calculus - N20.2    PLAN:           Orders Labs Urine Culture          Schedule Return Visit/Planned Activity: Next Available Appointment - Follow up MD, Schedule Surgery          Document Letter(s):  Created for Patient: Clinical Summary  Notes:   Left ureteral stone has only moved slightly more distal. Pt's symptoms have improved. He is eager to proceed with definitive procedure.  Discussed likely treatment scenarios specifically ESWL and Ureteroscopy with plan for OV and removal of stent after confirmation from review of operative note and/or KUB stone is no longer found to be a signficant risk to cause obstruction.  We did discuss the complications of shockwave lithotripsy including need for additional procedures, hematoma, obstructing fragments, and ongoing pain. I have reviewed the risks of ureteroscopy with the patient including bleeding, infection, ureteral injury, need for a stent or secondary procedures, thrombotic events and anesthetic complications.  I'll send a message to Dr Jeffie Pollock, pt preference is ESWL but will follow his urologist's recommendations. F/u instructions for interval worsening pain/discomfort, worsening LUTS, interval stone material passage, and/or associated fevers given.    * Signed by Jiles Crocker on 06/17/18 at 1:05 PM (EST)*

## 2018-06-24 ENCOUNTER — Encounter (HOSPITAL_COMMUNITY): Payer: Self-pay | Admitting: General Practice

## 2018-06-24 ENCOUNTER — Ambulatory Visit: Admit: 2018-06-24 | Payer: BLUE CROSS/BLUE SHIELD | Admitting: Urology

## 2018-06-24 ENCOUNTER — Encounter (HOSPITAL_COMMUNITY)
Admission: RE | Disposition: A | Discharge status: Home / Self Care | Payer: Self-pay | Source: Ambulatory Visit | Attending: Urology

## 2018-06-24 ENCOUNTER — Ambulatory Visit (HOSPITAL_COMMUNITY)
Admission: RE | Admit: 2018-06-24 | Discharge: 2018-06-24 | Disposition: A | Discharge status: Home / Self Care | Payer: BLUE CROSS/BLUE SHIELD | Source: Ambulatory Visit | Attending: Urology | Admitting: Urology

## 2018-06-24 ENCOUNTER — Ambulatory Visit (HOSPITAL_COMMUNITY): Payer: BLUE CROSS/BLUE SHIELD

## 2018-06-24 DIAGNOSIS — Z79899 Other long term (current) drug therapy: Secondary | ICD-10-CM | POA: Diagnosis not present

## 2018-06-24 DIAGNOSIS — Z87442 Personal history of urinary calculi: Secondary | ICD-10-CM | POA: Diagnosis not present

## 2018-06-24 DIAGNOSIS — N201 Calculus of ureter: Secondary | ICD-10-CM

## 2018-06-24 DIAGNOSIS — N202 Calculus of kidney with calculus of ureter: Secondary | ICD-10-CM | POA: Diagnosis present

## 2018-06-24 HISTORY — PX: EXTRACORPOREAL SHOCK WAVE LITHOTRIPSY: SHX1557

## 2018-06-24 HISTORY — DX: Personal history of urinary calculi: Z87.442

## 2018-06-24 SURGERY — LITHOTRIPSY, ESWL
Anesthesia: LOCAL | Laterality: Left

## 2018-06-24 SURGERY — Surgical Case
Anesthesia: *Unknown

## 2018-06-24 MED ORDER — DIAZEPAM 5 MG PO TABS
10.0000 mg | ORAL_TABLET | ORAL | Status: AC
  Administered 2018-06-24: 10 mg via ORAL
  Filled 2018-06-24: qty 2

## 2018-06-24 MED ORDER — CIPROFLOXACIN HCL 500 MG PO TABS
500.0000 mg | ORAL_TABLET | ORAL | Status: AC
  Administered 2018-06-24: 500 mg via ORAL
  Filled 2018-06-24: qty 1

## 2018-06-24 MED ORDER — DIPHENHYDRAMINE HCL 25 MG PO CAPS
25.0000 mg | ORAL_CAPSULE | ORAL | Status: AC
  Administered 2018-06-24: 25 mg via ORAL
  Filled 2018-06-24: qty 1

## 2018-06-24 MED ORDER — SODIUM CHLORIDE 0.9 % IV SOLN
INTRAVENOUS | Status: DC
  Administered 2018-06-24: 08:00:00 via INTRAVENOUS

## 2018-06-24 NOTE — Op Note (Signed)
See [facility] operative note scanned into chart. Also because of the size, density, location and other factors that cannot be anticipated I feel this will likely be a staged procedure. This fact supersedes any indication in the scanned [facility] operative note to the contrary.  

## 2018-06-24 NOTE — Discharge Instructions (Addendum)
1. You should strain your urine and collect all fragments and bring them to your follow up appointment.  2. You should take your pain medication as needed.  Please call if your pain is severe to the point that it is not controlled with your pain medication. 3. You should call if you develop fever > 101 or persistent nausea or vomiting. 4. Your doctor may prescribe tamsulosin to take to help facilitate stone passage. (You already have this.  Keep taking it until it is gone.)    Lithotripsy, Care After This sheet gives you information about how to care for yourself after your procedure. Your health care provider may also give you more specific instructions. If you have problems or questions, contact your health care provider. What can I expect after the procedure? After the procedure, it is common to have:  Some blood in your urine. This should only last for a few days.  Soreness in your back, sides, or upper abdomen for a few days.  Blotches or bruises on your back where the pressure wave entered the skin.  Pain, discomfort, or nausea when pieces (fragments) of the kidney stone move through the tube that carries urine from the kidney to the bladder (ureter). Stone fragments may pass soon after the procedure, but they may continue to pass for up to 4-8 weeks. ? If you have severe pain or nausea, contact your health care provider. This may be caused by a large stone that was not broken up, and this may mean that you need more treatment.  Some pain or discomfort during urination.  Some pain or discomfort in the lower abdomen or (in men) at the base of the penis.  Follow these instructions at home: Medicines  Take over-the-counter and prescription medicines only as told by your health care provider.  If you were prescribed an antibiotic medicine, take it as told by your health care provider. Do not stop taking the antibiotic even if you start to feel better.  Do not drive for 24 hours if  you were given a medicine to help you relax (sedative).  Do not drive or use heavy machinery while taking prescription pain medicine. Eating and drinking  Drink enough water and fluids to keep your urine clear or pale yellow. This helps any remaining pieces of the stone to pass. It can also help prevent new stones from forming.  Eat plenty of fresh fruits and vegetables.  Follow instructions from your health care provider about eating and drinking restrictions. You may be instructed: ? To reduce how much salt (sodium) you eat or drink. Check ingredients and nutrition facts on packaged foods and beverages. ? To reduce how much meat you eat.  Eat the recommended amount of calcium for your age and gender. Ask your health care provider how much calcium you should have. General instructions  Get plenty of rest.  Most people can resume normal activities 1-2 days after the procedure. Ask your health care provider what activities are safe for you.  If directed, strain all urine through the strainer that was provided by your health care provider. ? Keep all fragments for your health care provider to see. Any stones that are found may be sent to a medical lab for examination. The stone may be as small as a grain of salt.  Keep all follow-up visits as told by your health care provider. This is important. Contact a health care provider if:  You have pain that is severe or does  not get better with medicine.  You have nausea that is severe or does not go away.  You have blood in your urine longer than your health care provider told you to expect.  You have more blood in your urine.  You have pain during urination that does not go away.  You urinate more frequently than usual and this does not go away.  You develop a rash or any other possible signs of an allergic reaction. Get help right away if:  You have severe pain in your back, sides, or upper abdomen.  You have severe pain while  urinating.  Your urine is very dark red.  You have blood in your stool (feces).  You cannot pass any urine at all.  You feel a strong urge to urinate after emptying your bladder.  You have a fever or chills.  You develop shortness of breath, difficulty breathing, or chest pain.  You have severe nausea that leads to persistent vomiting.  You faint. Summary  After this procedure, it is common to have some pain, discomfort, or nausea when pieces (fragments) of the kidney stone move through the tube that carries urine from the kidney to the bladder (ureter). If this pain or nausea is severe, however, you should contact your health care provider.  Most people can resume normal activities 1-2 days after the procedure. Ask your health care provider what activities are safe for you.  Drink enough water and fluids to keep your urine clear or pale yellow. This helps any remaining pieces of the stone to pass, and it can help prevent new stones from forming.  If directed, strain your urine and keep all fragments for your health care provider to see. Fragments or stones may be as small as a grain of salt.  Get help right away if you have severe pain in your back, sides, or upper abdomen or have severe pain while urinating. This information is not intended to replace advice given to you by your health care provider. Make sure you discuss any questions you have with your health care provider. Document Released: 08/06/2007 Document Revised: 06/07/2016 Document Reviewed: 06/07/2016 Elsevier Interactive Patient Education  [employer].

## 2018-06-24 NOTE — Interval H&P Note (Signed)
History and Physical Interval Note:  06/24/2018 7:44 AM  Andrew Hogan  has presented today for surgery, with the diagnosis of LEFT DISTAL STONE  The various methods of treatment have been discussed with the patient and family. After consideration of risks, benefits and other options for treatment, the patient has consented to  Procedure(s): EXTRACORPOREAL SHOCK WAVE LITHOTRIPSY (ESWL) (Left) as a surgical intervention .  The patient's history has been reviewed, patient examined, no change in status, stable for surgery.  I have reviewed the patient's chart and labs.  Questions were answered to the patient's satisfaction.     [name],LES

## 2018-06-25 ENCOUNTER — Encounter (HOSPITAL_COMMUNITY): Payer: Self-pay | Admitting: Urology

## 2018-07-09 ENCOUNTER — Other Ambulatory Visit: Payer: Self-pay | Admitting: Urology

## 2018-07-12 ENCOUNTER — Other Ambulatory Visit: Payer: Self-pay

## 2018-07-12 ENCOUNTER — Encounter (HOSPITAL_COMMUNITY): Payer: Self-pay | Admitting: *Deleted

## 2018-07-15 ENCOUNTER — Encounter (HOSPITAL_BASED_OUTPATIENT_CLINIC_OR_DEPARTMENT_OTHER): Payer: Self-pay

## 2018-07-17 NOTE — H&P (Signed)
CC: I have a history of kidney stones.  HPI: Andrew Hogan is a 47 year-old male established patient who is here for F/U due to a history of renal calculi.  07/08/18: 2 week f/u after ESWL for a left ureteral calculi identified in the distal ureter. He reports not passing any significant stone fragments post procedure despite continuing tamsulosin. Fortunately has not had much in the way of left-sided pain or discomfort. Denies an increase in lower urinary tract symptoms. Denies burning or painful urination, gross hematuria. He has been afebrile. He does report to me today having significant right lower back pain which has been an issue for him previously but does seem worse over the past several days. Pain does not radiate and typically not present with rest. It exacerbates with movement.   The patient was last seen 06/24/2018. The patient's stone was on his left side. He did pass a stone since the last office visit .   The patient has had flank pain since they were last seen. He has no seen blood in his urine since the last visit. He is currently having back pain. He denies having flank pain, groin pain, nausea, vomiting, fever, and chills. He has had ESWL for treatment of his stones in the past.     ALLERGIES: None    MEDICATIONS: Tamsulosin Hcl 0.4 mg capsule 1 capsule PO Daily     GU PSH: ESWL, Left - 06/24/2018    NON-GU PSH: None   GU PMH: Renal and ureteral calculus - 06/17/2018 Renal calculus, Bilateral renal stones without obstruction but with growth since 2015. He will need a metabolic w/u going forward when he has cleared the ureteral stone. Hypercalcuria profile today. He will probably need a left PCNL and right ESWL at some point. - 06/10/2018 Ureteral calculus, 6 x 3m left distal stone that has progressed from the proximal ureter. I discussed MET, ESWL and URS and will have him return in 1-2 weeks for a KUB. - 06/10/2018    NON-GU PMH: Gout    FAMILY HISTORY: Death - Father     Notes: Mother still living at age 49 Father deceased at 886  SOCIAL HISTORY: Marital Status: Married Preferred Language: English; Ethnicity: Not Hispanic Or Latino; Race: Asian Current Smoking Status: Patient has never smoked.   Tobacco Use Assessment Completed: Used Tobacco in last 30 days? Has never drank.  Does not drink caffeine. Patient's occupation [employer]    REVIEW OF SYSTEMS:    GU Review Male:   Patient denies frequent urination, hard to postpone urination, burning/ pain with urination, get up at night to urinate, leakage of urine, stream starts and stops, trouble starting your stream, have to strain to urinate , erection problems, and penile pain.  Gastrointestinal (Upper):   Patient denies nausea, vomiting, and indigestion/ heartburn.  Gastrointestinal (Lower):   Patient denies diarrhea and constipation.  Constitutional:   Patient denies fever, night sweats, weight loss, and fatigue.  Skin:   Patient denies skin rash/ lesion and itching.  Eyes:   Patient denies blurred vision and double vision.  Ears/ Nose/ Throat:   Patient denies sore throat and sinus problems.  Hematologic/Lymphatic:   Patient denies swollen glands and easy bruising.  Cardiovascular:   Patient denies leg swelling and chest pains.  Respiratory:   Patient denies cough and shortness of breath.  Endocrine:   Patient denies excessive thirst.  Musculoskeletal:   Patient denies back pain and joint pain.  Neurological:  Patient denies headaches and dizziness.  Psychologic:   Patient denies depression and anxiety.   VITAL SIGNS:      07/08/2018 08:35 AM  BP 122/81 mmHg  Pulse 96 /min  Temperature 98.0 F / 36.6 C   MULTI-SYSTEM PHYSICAL EXAMINATION:    Constitutional: Well-nourished. No physical deformities. Normally developed. Good grooming.  Neck: Neck symmetrical, not swollen. Normal tracheal position.  Respiratory: Normal breath sounds. No labored breathing, no use of accessory  muscles.   Cardiovascular: Regular rate and rhythm. No murmur, no gallop.   Skin: No paleness, no jaundice, no cyanosis. No lesion, no ulcer, no rash.  Neurologic / Psychiatric: Oriented to time, oriented to place, oriented to person. No depression, no anxiety, no agitation.  Gastrointestinal: No mass, no tenderness, no rigidity, non obese abdomen. No CVAT, flank or lower abdominal tenderness.  Musculoskeletal: Right lumbar tenderness of spine, ribs, pelvis. Normal gait and station of head and neck.     PAST DATA REVIEWED:  Source Of History:  Patient  Records Review:   Previous Hospital Records, Previous Patient Records  Urine Test Review:   Urinalysis  X-Ray Review: KUB: Reviewed Films. Discussed With Patient.     07/08/18  Urinalysis  Urine Appearance Clear   Urine Color Yellow   Urine Glucose Neg mg/dL  Urine Bilirubin Neg mg/dL  Urine Ketones Neg mg/dL  Urine Specific Gravity 1.020   Urine Blood Neg ery/uL  Urine pH <=5.0   Urine Protein Neg mg/dL  Urine Urobilinogen 0.2 mg/dL  Urine Nitrites Neg   Urine Leukocyte Esterase Neg leu/uL   PROCEDURES:         KUB - 67209  A single view of the abdomen is obtained. Left UPJ calculus measuring 8.6 mm wide respectively. Just distal to the left sacral wing 3 distinct fragments measuring 5.6 mm, 4 mm, and 5.6 mm width are identified. Stable mid to left lower pole calculi visualized on the left as well as a stable appearing right renal pelvis calculi. No right ureteral calculi visualized.               Urinalysis Dipstick Dipstick Cont'd  Color: Yellow Bilirubin: Neg mg/dL  Appearance: Clear Ketones: Neg mg/dL  Specific Gravity: 1.020 Blood: Neg ery/uL  pH: <=5.0 Protein: Neg mg/dL  Glucose: Neg mg/dL Urobilinogen: 0.2 mg/dL    Nitrites: Neg    Leukocyte Esterase: Neg leu/uL    ASSESSMENT:      ICD-10 Details  1 GU:   Renal and ureteral calculus - N20.2 Left, Worsening  2   Low back pain - M54.5 Right, His back pain seems  MSK in nature. No right ureteral calculi seen. He was provided flexeril and pain medication for this today. PCP f/u also recommended.   PLAN:            Medications New Meds: Cyclobenzaprine Hcl 5 mg tablet 1 tablet PO BID   #20  0 Refill(s)  Hydrocodone-Acetaminophen 5 mg-325 mg tablet 1 tablet PO Q 6 H PRN   #20  0 Refill(s)    Stop Meds: Ondansetron Hcl 1 PO Daily  Discontinue: 07/08/2018  - Reason: The medication cycle was completed.            Orders Labs Urine Culture          Schedule Return Visit/Planned Activity: 1 Week - Schedule Surgery  Procedure: 07/08/2018 at [facility] [address] (Toradol Per [address] - [id number] [phone]  Document Letter(s):  Created for Patient: Clinical Summary         Notes:   The treated calculus appears to have fragmented into 3 distinct pieces in the distal left ureter. The previously identified left upper pole calculus appears to have transitioned into the left UPJ. He has no left sided symptoms. Afebrile and voiding at baseline. I reviewed imaging studies with his urologist and discussed further with patient. He will need ureteroscopy to treat the left-sided stone burden with goal of addressing the remaining calculi within the left kidney as well.  I have reviewed the risks of ureteroscopy with the patient including bleeding, infection, ureteral injury, need for a stent or secondary procedures, thrombotic events and anesthetic complications.  He expressed understanding with plan of care. I will fill out a scheduling sheet and turn in to Dr Ralene Muskrat surgery scheduler and the patient will be contacted to set up a [place] and time. He may f/u sooner with interval stone material passage, worsening LUTS, worsening pain, fevers. A urine c/s will be sent.

## 2018-07-18 ENCOUNTER — Encounter (HOSPITAL_COMMUNITY): Payer: Self-pay | Admitting: *Deleted

## 2018-07-18 ENCOUNTER — Ambulatory Visit (HOSPITAL_COMMUNITY): Payer: BLUE CROSS/BLUE SHIELD | Admitting: Anesthesiology

## 2018-07-18 ENCOUNTER — Encounter (HOSPITAL_COMMUNITY)
Admission: RE | Disposition: A | Discharge status: Home / Self Care | Payer: Self-pay | Source: Home / Self Care | Attending: Urology

## 2018-07-18 ENCOUNTER — Ambulatory Visit (HOSPITAL_COMMUNITY): Payer: BLUE CROSS/BLUE SHIELD

## 2018-07-18 ENCOUNTER — Ambulatory Visit (HOSPITAL_COMMUNITY)
Admission: RE | Admit: 2018-07-18 | Discharge: 2018-07-18 | Disposition: A | Discharge status: Home / Self Care | Payer: BLUE CROSS/BLUE SHIELD | Attending: Urology | Admitting: Urology

## 2018-07-18 DIAGNOSIS — N202 Calculus of kidney with calculus of ureter: Secondary | ICD-10-CM | POA: Diagnosis not present

## 2018-07-18 DIAGNOSIS — M109 Gout, unspecified: Secondary | ICD-10-CM | POA: Insufficient documentation

## 2018-07-18 DIAGNOSIS — Z87442 Personal history of urinary calculi: Secondary | ICD-10-CM | POA: Diagnosis not present

## 2018-07-18 DIAGNOSIS — Z79899 Other long term (current) drug therapy: Secondary | ICD-10-CM | POA: Diagnosis not present

## 2018-07-18 DIAGNOSIS — E669 Obesity, unspecified: Secondary | ICD-10-CM | POA: Diagnosis not present

## 2018-07-18 DIAGNOSIS — Z6830 Body mass index (BMI) 30.0-30.9, adult: Secondary | ICD-10-CM | POA: Insufficient documentation

## 2018-07-18 HISTORY — PX: CYSTOSCOPY WITH RETROGRADE PYELOGRAM, URETEROSCOPY AND STENT PLACEMENT: SHX5789

## 2018-07-18 LAB — BASIC METABOLIC PANEL
Anion gap: 8 (ref 5–15)
BUN: 20 mg/dL (ref 6–20)
CO2: 22 mmol/L (ref 22–32)
Calcium: 9.1 mg/dL (ref 8.9–10.3)
Chloride: 110 mmol/L (ref 98–111)
Creatinine, Ser: 1.7 mg/dL — ABNORMAL HIGH (ref 0.61–1.24)
GFR calc non Af Amer: 46 mL/min — ABNORMAL LOW (ref >60)
GFR, EST AFRICAN AMERICAN: 54 mL/min — AB (ref >60)
Glucose, Bld: 90 mg/dL (ref 70–99)
Potassium: 3.7 mmol/L (ref 3.5–5.1)
SODIUM: 140 mmol/L (ref 135–145)

## 2018-07-18 LAB — CBC
HCT: 42.5 % (ref 39.0–52.0)
Hemoglobin: 14 g/dL (ref 13.0–17.0)
MCH: 30 pg (ref 26.0–34.0)
MCHC: 32.9 g/dL (ref 30.0–36.0)
MCV: 91.2 fL (ref 80.0–100.0)
NRBC: 0 % (ref 0.0–0.2)
Platelets: 296 10*3/uL (ref 150–400)
RBC: 4.66 MIL/uL (ref 4.22–5.81)
RDW: 12.2 % (ref 11.5–15.5)
WBC: 5.9 10*3/uL (ref 4.0–10.5)

## 2018-07-18 SURGERY — CYSTOURETEROSCOPY, WITH RETROGRADE PYELOGRAM AND STENT INSERTION
Anesthesia: General | Laterality: Left

## 2018-07-18 MED ORDER — FENTANYL CITRATE (PF) 100 MCG/2ML IJ SOLN
INTRAMUSCULAR | Status: DC | PRN
  Administered 2018-07-18: 50 ug via INTRAVENOUS
  Administered 2018-07-18 (×2): 25 ug via INTRAVENOUS

## 2018-07-18 MED ORDER — OXYCODONE HCL 5 MG PO TABS: 5.0000 mg | ORAL_TABLET | ORAL | 0 refills | Status: DC | PRN

## 2018-07-18 MED ORDER — SODIUM CHLORIDE 0.9 % IR SOLN
Status: DC | PRN
  Administered 2018-07-18: 6000 mL via INTRAVESICAL

## 2018-07-18 MED ORDER — LIDOCAINE 2% (20 MG/ML) 5 ML SYRINGE
INTRAMUSCULAR | Status: DC | PRN
  Administered 2018-07-18: 100 mg via INTRAVENOUS

## 2018-07-18 MED ORDER — DEXAMETHASONE SODIUM PHOSPHATE 10 MG/ML IJ SOLN
INTRAMUSCULAR | Status: DC | PRN
  Administered 2018-07-18: 10 mg via INTRAVENOUS

## 2018-07-18 MED ORDER — PHENAZOPYRIDINE HCL 200 MG PO TABS: 200.0000 mg | ORAL_TABLET | Freq: Three times a day (TID) | ORAL | 0 refills | Status: DC | PRN

## 2018-07-18 MED ORDER — PROPOFOL 10 MG/ML IV BOLUS
INTRAVENOUS | Status: DC | PRN
  Administered 2018-07-18: 200 mg via INTRAVENOUS

## 2018-07-18 MED ORDER — CEFAZOLIN SODIUM-DEXTROSE 2-4 GM/100ML-% IV SOLN
2.0000 g | INTRAVENOUS | Status: AC
  Administered 2018-07-18: 2 g via INTRAVENOUS
  Filled 2018-07-18: qty 100

## 2018-07-18 MED ORDER — LACTATED RINGERS IV SOLN
INTRAVENOUS | Status: DC
  Administered 2018-07-18: 11:00:00 via INTRAVENOUS

## 2018-07-18 MED ORDER — ONDANSETRON HCL 4 MG/2ML IJ SOLN
INTRAMUSCULAR | Status: DC | PRN
  Administered 2018-07-18: 4 mg via INTRAVENOUS

## 2018-07-18 MED ORDER — MIDAZOLAM HCL 5 MG/5ML IJ SOLN
INTRAMUSCULAR | Status: DC | PRN
  Administered 2018-07-18: 2 mg via INTRAVENOUS

## 2018-07-18 SURGICAL SUPPLY — 27 items
BAG URO CATCHER STRL LF (MISCELLANEOUS) ×3 IMPLANT
BASKET STONE NCOMPASS (UROLOGICAL SUPPLIES) IMPLANT
CATH URET 5FR 28IN OPEN ENDED (CATHETERS) ×3 IMPLANT
CATH URET DUAL LUMEN 6-10FR 50 (CATHETERS) IMPLANT
CLOTH BEACON ORANGE TIMEOUT ST (SAFETY) ×3 IMPLANT
COVER WAND RF STERILE (DRAPES) IMPLANT
EXTRACTOR STONE NITINOL NGAGE (UROLOGICAL SUPPLIES) ×3 IMPLANT
FIBER LASER FLEXIVA 1000 (UROLOGICAL SUPPLIES) IMPLANT
FIBER LASER FLEXIVA 365 (UROLOGICAL SUPPLIES) IMPLANT
FIBER LASER FLEXIVA 550 (UROLOGICAL SUPPLIES) IMPLANT
FIBER LASER TRAC TIP (UROLOGICAL SUPPLIES) ×3 IMPLANT
GLOVE SURG SS PI 8.0 STRL IVOR (GLOVE) ×9 IMPLANT
GOWN STRL REUS W/TWL XL LVL3 (GOWN DISPOSABLE) ×6 IMPLANT
GUIDEWIRE STR DUAL SENSOR (WIRE) ×6 IMPLANT
IV NS 1000ML (IV SOLUTION)
IV NS 1000ML BAXH (IV SOLUTION) IMPLANT
IV NS IRRIG 3000ML ARTHROMATIC (IV SOLUTION) IMPLANT
KIT BALLN UROMAX 15FX4 (MISCELLANEOUS) ×1 IMPLANT
KIT BALLN UROMAX 26 75X4 (MISCELLANEOUS) ×2
MANIFOLD NEPTUNE II (INSTRUMENTS) ×3 IMPLANT
PACK CYSTO (CUSTOM PROCEDURE TRAY) ×3 IMPLANT
SHEATH URETERAL 12FRX35CM (MISCELLANEOUS) IMPLANT
SHEATH URETERAL 12FRX55CM (UROLOGICAL SUPPLIES) ×3 IMPLANT
STENT CONTOUR 6FRX26X.038 (STENTS) ×3 IMPLANT
TUBING CONNECTING 10 (TUBING) ×2 IMPLANT
TUBING CONNECTING 10' (TUBING) ×1
TUBING UROLOGY SET (TUBING) ×3 IMPLANT

## 2018-07-18 NOTE — Interval H&P Note (Signed)
History and Physical Interval Note:  07/18/2018 12:26 PM  Andrew Hogan  has presented today for surgery, with the diagnosis of LEFT URETERAL AND RENAL STONE  The various methods of treatment have been discussed with the patient and family. After consideration of risks, benefits and other options for treatment, the patient has consented to  Procedure(s): CYSTOSCOPY WITH LEFT RETROGRADE PYELOGRAM, URETEROSCOPY HOLMIUM LASER AND STENT PLACEMENT (Left) as a surgical intervention .  The patient's history has been reviewed, patient examined, no change in status, stable for surgery.  I have reviewed the patient's chart and labs.  Questions were answered to the patient's satisfaction.     Bjorn PippinJohn Shakiyah Hogan

## 2018-07-18 NOTE — Transfer of Care (Signed)
Immediate Anesthesia Transfer of Care Note  Patient: Andrew Hogan  Procedure(s) Performed: CYSTOSCOPY WITH LEFT RETROGRADE PYELOGRAM, URETEROSCOPY HOLMIUM LASER AND STENT PLACEMENT, STONE REMOVAL (Left )  Patient Location: PACU  Anesthesia Type:General  Level of Consciousness: drowsy  Airway & Oxygen Therapy: Patient Spontanous Breathing and Patient connected to face mask oxygen  Post-op Assessment: Report given to RN and Post -op Vital signs reviewed and stable  Post vital signs: Reviewed and stable  Last Vitals:  Vitals Value Taken Time  BP 134/86 07/18/2018  2:27 PM  Temp 36.6 C 07/18/2018  2:27 PM  Pulse 86 07/18/2018  2:28 PM  Resp 14 07/18/2018  2:28 PM  SpO2 100 % 07/18/2018  2:28 PM  Vitals shown include unvalidated device data.  Last Pain:  Vitals:   07/18/18 0956  TempSrc: Oral         Complications: No apparent anesthesia complications

## 2018-07-18 NOTE — Anesthesia Preprocedure Evaluation (Addendum)
Anesthesia Evaluation  Patient identified by MRN, date of birth, ID band Patient awake    Reviewed: Allergy & Precautions, NPO status , Patient's Chart, lab work & pertinent test results  History of Anesthesia Complications Negative for: history of anesthetic complications  Airway Mallampati: II  TM Distance: >3 FB Neck ROM: Full    Dental  (+) Dental Advisory Given, Teeth Intact   Pulmonary neg pulmonary ROS,    breath sounds clear to auscultation       Cardiovascular negative cardio ROS   Rhythm:Regular Rate:Normal     Neuro/Psych negative neurological ROS  negative psych ROS   GI/Hepatic negative GI ROS, Neg liver ROS,   Endo/Other   Obesity   Renal/GU Renal disease Kidney stones      Musculoskeletal  Gout    Abdominal (+) + obese,   Peds  Hematology negative hematology ROS (+)   Anesthesia Other Findings   Reproductive/Obstetrics                           Anesthesia Physical Anesthesia Plan  ASA: II  Anesthesia Plan: General   Post-op Pain Management:    Induction: Intravenous  PONV Risk Score and Plan: 2 and Treatment may vary due to age or medical condition, Ondansetron, Dexamethasone and Midazolam  Airway Management Planned: LMA  Additional Equipment: None  Intra-op Plan:   Post-operative Plan: Extubation in OR  Informed Consent: I have reviewed the patients History and Physical, chart, labs and discussed the procedure including the risks, benefits and alternatives for the proposed anesthesia with the patient or authorized representative who has indicated his/her understanding and acceptance.   Dental advisory given  Plan Discussed with: CRNA and Anesthesiologist  Anesthesia Plan Comments:        Anesthesia Quick Evaluation

## 2018-07-18 NOTE — Discharge Instructions (Signed)
Ureteral Stent Implantation, Care After °Refer to this sheet in the next few weeks. These instructions provide you with information about caring for yourself after your procedure. Your health care provider may also give you more specific instructions. Your treatment has been planned according to current medical practices, but problems sometimes occur. Call your health care provider if you have any problems or questions after your procedure. °What can I expect after the procedure? °After the procedure, it is common to have: °· Nausea. °· Mild pain when you urinate. You may feel this pain in your lower back or lower abdomen. Pain should stop within a few minutes after you urinate. This may last for up to 1 week. °· A small amount of blood in your urine for several days. °Follow these instructions at home: ° °Medicines °· Take over-the-counter and prescription medicines only as told by your health care provider. °· If you were prescribed an antibiotic medicine, take it as told by your health care provider. Do not stop taking the antibiotic even if you start to feel better. °· Do not drive for 24 hours if you received a sedative. °· Do not drive or operate heavy machinery while taking prescription pain medicines. °Activity °· Return to your normal activities as told by your health care provider. Ask your health care provider what activities are safe for you. °· Do not lift anything that is heavier than 10 lb (4.5 kg). Follow this limit for 1 week after your procedure, or for as long as told by your health care provider. °General instructions °· Watch for any blood in your urine. Call your health care provider if the amount of blood in your urine increases. °· If you have a catheter: °? Follow instructions from your health care provider about taking care of your catheter and collection bag. °? Do not take baths, swim, or use a hot tub until your health care provider approves. °· Drink enough fluid to keep your urine  clear or pale yellow. °· Keep all follow-up visits as told by your health care provider. This is important. °Contact a health care provider if: °· You have pain that gets worse or does not get better with medicine, especially pain when you urinate. °· You have difficulty urinating. °· You feel nauseous or you vomit repeatedly during a period of more than 2 days after the procedure. °Get help right away if: °· Your urine is dark red or has blood clots in it. °· You are leaking urine (have incontinence). °· The end of the stent comes out of your urethra. °· You cannot urinate. °· You have sudden, sharp, or severe pain in your abdomen or lower back. °· You have a fever. °This information is not intended to replace advice given to you by your health care provider. Make sure you discuss any questions you have with your health care provider. °Document Released: 03/19/2013 Document Revised: 12/23/2015 Document Reviewed: 01/29/2015 °Elsevier Interactive Patient Education © 2019 [employer] ° ° °General Anesthesia, Adult, Care After °This sheet gives you information about how to care for yourself after your procedure. Your health care provider may also give you more specific instructions. If you have problems or questions, contact your health care provider. °What can I expect after the procedure? °After the procedure, the following side effects are common: °· Pain or discomfort at the IV site. °· Nausea. °· Vomiting. °· Sore throat. °· Trouble concentrating. °· Feeling cold or chills. °· Weak or tired. °· Sleepiness and   fatigue. °· Soreness and body aches. These side effects can affect parts of the body that were not involved in surgery. °Follow these instructions at home: ° °For at least 24 hours after the procedure: °· Have a responsible adult stay with you. It is important to have someone help care for you until you are awake and alert. °· Rest as needed. °· Do not: °? Participate in activities in which you could fall  or become injured. °? Drive. °? Use heavy machinery. °? Drink alcohol. °? Take sleeping pills or medicines that cause drowsiness. °? Make important decisions or sign legal documents. °? Take care of children on your own. °Eating and drinking °· Follow any instructions from your health care provider about eating or drinking restrictions. °· When you feel hungry, start by eating small amounts of foods that are soft and easy to digest (bland), such as toast. Gradually return to your regular diet. °· Drink enough fluid to keep your urine pale yellow. °· If you vomit, rehydrate by drinking water, juice, or clear broth. °General instructions °· If you have sleep apnea, surgery and certain medicines can increase your risk for breathing problems. Follow instructions from your health care provider about wearing your sleep device: °? Anytime you are sleeping, including during daytime naps. °? While taking prescription pain medicines, sleeping medicines, or medicines that make you drowsy. °· Return to your normal activities as told by your health care provider. Ask your health care provider what activities are safe for you. °· Take over-the-counter and prescription medicines only as told by your health care provider. °· If you smoke, do not smoke without supervision. °· Keep all follow-up visits as told by your health care provider. This is important. °Contact a health care provider if: °· You have nausea or vomiting that does not get better with medicine. °· You cannot eat or drink without vomiting. °· You have pain that does not get better with medicine. °· You are unable to pass urine. °· You develop a skin rash. °· You have a fever. °· You have redness around your IV site that gets worse. °Get help right away if: °· You have difficulty breathing. °· You have chest pain. °· You have blood in your urine or stool, or you vomit blood. °Summary °· After the procedure, it is common to have a sore throat or nausea. It is also  common to feel tired. °· Have a responsible adult stay with you for the first 24 hours after general anesthesia. It is important to have someone help care for you until you are awake and alert. °· When you feel hungry, start by eating small amounts of foods that are soft and easy to digest (bland), such as toast. Gradually return to your regular diet. °· Drink enough fluid to keep your urine pale yellow. °· Return to your normal activities as told by your health care provider. Ask your health care provider what activities are safe for you. °This information is not intended to replace advice given to you by your health care provider. Make sure you discuss any questions you have with your health care provider. °Document Released: 10/23/2000 Document Revised: 03/02/2017 Document Reviewed: 03/02/2017 °Elsevier Interactive Patient Education © 2019 Elsevier Inc. ° ° °

## 2018-07-18 NOTE — Interval H&P Note (Signed)
History and Physical Interval Note:  He has requested that I try to get the right renal stone as well and if the left side is not too difficult, I will consider that at the time of the procedure.   07/18/2018 12:30 PM  Andrew StageKevin William Hogan  has presented today for surgery, with the diagnosis of LEFT URETERAL AND RENAL STONE  The various methods of treatment have been discussed with the patient and family. After consideration of risks, benefits and other options for treatment, the patient has consented to  Procedure(s): CYSTOSCOPY WITH LEFT RETROGRADE PYELOGRAM, URETEROSCOPY HOLMIUM LASER AND STENT PLACEMENT (Left) as a surgical intervention .  The patient's history has been reviewed, patient examined, no change in status, stable for surgery.  I have reviewed the patient's chart and labs.  Questions were answered to the patient's satisfaction.     Bjorn PippinJohn Telina Kleckley

## 2018-07-18 NOTE — Op Note (Signed)
Procedure: 1.  Cystoscopy with left retrograde pyelogram and interpretation. 2.  Left ureteroscopic stone extraction of distal ureteral stones. 3.  Left ureteroscopy with holmium laser application, stone extraction and insertion of left double-J stent for left UPJ and lower pole stones.  Preop diagnosis: 1.  Multiple left distal ureteral stones. 2.  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  Postop diagnosis: Same.  Surgeon: Dr. Irine Seal.  Anesthesia: General.  Specimen: Stone fragments.  Drains: 6 French by 26 cm left contour double-J stent.  EBL: Minimal.  Complications: None.  Indications: Andrew Hogan is a 47 year old male with urolithiasis.  He had previously undergone lithotripsy for a left distal ureteral stone that it progressed from the UPJ on 06/21/2018 but the stone failed to fragment.  He returns today for left ureteroscopy for removal of the left distal ureteral stones as well as additional left UPJ and lower pole stones.  He has a right renal stone that was felt to be appropriate for lithotripsy at a later date.  He requested that we address that today if possible, but the schedule did not allow it.  Procedure: He was taken to the operating room where he was given antibiotic.  A general anesthetic was induced.  He was placed in lithotomy position and fitted with PAS hose.  His perineum and genitalia were prepped with Betadine solution and he was draped in usual sterile fashion.  Cystoscopy was performed using the 23 [place] scope and the 30 degree lens.  Examination revealed a normal urethra.  The external sphincter was intact.  The prostatic urethra short without obstruction.  The bladder wall had mild trabeculation without tumors or mucosal lesions.  There were some small stone fragments in the bladder.  Ureteral orifice ease were unremarkable.  The left ureteral orifice was cannulated with a 5 French opening catheter and contrast was instilled.  The left retrograde  pyelogram demonstrated 3 filling defects in the distal ureter consistent with the distal ureteral stones seen on prior imaging.  There was proximal dilation above this.  There was approximately 1 cm stone at the left UPJ with some obstruction and a small partially branched calculus in the lower pole.  A sensor guidewire was then passed to the kidney under fluoroscopic guidance.  A 4 cm x 15 French balloon dilation catheter was then passed over the wire across the intramural ureter and then inflated to 20 atm.  The balloon was deflated and removed along with the cystoscope.  The dual lumen semirigid ureteroscope was then passed alongside the wire and the 3 distal ureteral stones were removed intact.  A 55 cm digital access sheath inner core was then passed over the wire without difficulty to the level of the UPJ stone.  The assembled sheath was then passed to the same level as well.  The inner core was removed and a second guidewire was passed to the kidney.  The sheath was then removed, reassembled and inserted over 1 of the wires to the level of the UPJ.  At this point the stone at the UPJ about back into a lower pole calyx.  The inner core and working wire were removed leaving the safety wire and sheath in place.  The dual-lumen digital flexible scope was passed to the kidney and the stone that had been at the UPJ was identified.  The stone was engaged with a.  200 m laser fiber initially set on 0.8 J and 10 Hz, but the settings were eventually changed  to 0.5 J and 50 Hz.  The targeted stone was broken and the manageable fragments which were then removed using the engage basket.  I then fragmented the lower pole stones and removed several of the larger fragments.  Once visual and fluoroscopic inspection demonstrated no significant large fragments, the ureteroscope was removed along with the sheath with visual inspection of the entire ureter.  No significant injury was identified.  The cystoscope was  reinserted over the wire and a 6 [place] by 26 cm contour double-J stent was passed to the kidney under fluoroscopic guidance.  The wire was removed, leaving a good coil in the kidney and a good coil in the bladder.  His bladder was drained and the cystoscope was removed.  He was taken down from lithotomy position, his anesthetic was reversed and he was moved recovery room in stable condition.  There were no complications.  The stone fragments were given to the patient's wife.

## 2018-07-18 NOTE — Anesthesia Procedure Notes (Addendum)
Procedure Name: LMA Insertion Date/Time: 07/18/2018 12:49 PM Performed by: Florene Routeeardon, Kristene Liberati L, CRNA Patient Re-evaluated:Patient Re-evaluated prior to induction Oxygen Delivery Method: Circle system utilized Preoxygenation: Pre-oxygenation with 100% oxygen Induction Type: IV induction Ventilation: Mask ventilation without difficulty LMA: LMA inserted LMA Size: 4.0 Number of attempts: 1 Placement Confirmation: positive ETCO2 and breath sounds checked- equal and bilateral Tube secured with: Tape Dental Injury: Teeth and Oropharynx as per pre-operative assessment

## 2018-07-19 ENCOUNTER — Encounter (HOSPITAL_COMMUNITY): Payer: Self-pay | Admitting: Urology

## 2018-07-19 NOTE — Anesthesia Postprocedure Evaluation (Signed)
Anesthesia Post Note  Patient: Andrew Hogan  Procedure(s) Performed: CYSTOSCOPY WITH LEFT RETROGRADE PYELOGRAM, URETEROSCOPY HOLMIUM LASER AND STENT PLACEMENT, STONE REMOVAL (Left )     Patient location during evaluation: PACU Anesthesia Type: General Level of consciousness: awake and alert Pain management: pain level controlled Vital Signs Assessment: post-procedure vital signs reviewed and stable Respiratory status: spontaneous breathing, nonlabored ventilation and respiratory function stable Cardiovascular status: blood pressure returned to baseline and stable Postop Assessment: no apparent nausea or vomiting Anesthetic complications: no    Last Vitals:  Vitals:   07/18/18 1515 07/18/18 1530  BP: (!) 136/95 138/89  Pulse: 70 62  Resp: 15 13  Temp: 36.5 C 36.6 C  SpO2: 96% 97%    Last Pain:  Vitals:   07/18/18 1530  TempSrc:   PainSc: 0-No pain                 Beryle Lathehomas E Lenya Sterne

## 2019-01-29 ENCOUNTER — Other Ambulatory Visit: Payer: Self-pay | Admitting: Urology

## 2019-01-30 ENCOUNTER — Other Ambulatory Visit: Payer: Self-pay | Admitting: Urology

## 2019-02-10 ENCOUNTER — Encounter (HOSPITAL_COMMUNITY): Payer: Self-pay | Admitting: General Practice

## 2019-02-13 ENCOUNTER — Ambulatory Visit (HOSPITAL_COMMUNITY): Payer: BC Managed Care – PPO

## 2019-02-13 ENCOUNTER — Encounter (HOSPITAL_COMMUNITY): Payer: Self-pay | Admitting: *Deleted

## 2019-02-13 ENCOUNTER — Other Ambulatory Visit: Payer: Self-pay

## 2019-02-13 ENCOUNTER — Encounter (HOSPITAL_COMMUNITY)
Admission: RE | Disposition: A | Discharge status: Home / Self Care | Payer: Self-pay | Source: Home / Self Care | Attending: Urology

## 2019-02-13 ENCOUNTER — Ambulatory Visit (HOSPITAL_COMMUNITY)
Admission: RE | Admit: 2019-02-13 | Discharge: 2019-02-13 | Disposition: A | Discharge status: Home / Self Care | Payer: BC Managed Care – PPO | Attending: Urology | Admitting: Urology

## 2019-02-13 DIAGNOSIS — N201 Calculus of ureter: Secondary | ICD-10-CM | POA: Insufficient documentation

## 2019-02-13 DIAGNOSIS — N183 Chronic kidney disease, stage 3 (moderate): Secondary | ICD-10-CM | POA: Diagnosis not present

## 2019-02-13 DIAGNOSIS — N200 Calculus of kidney: Secondary | ICD-10-CM

## 2019-02-13 DIAGNOSIS — E790 Hyperuricemia without signs of inflammatory arthritis and tophaceous disease: Secondary | ICD-10-CM | POA: Diagnosis not present

## 2019-02-13 DIAGNOSIS — Z87442 Personal history of urinary calculi: Secondary | ICD-10-CM | POA: Insufficient documentation

## 2019-02-13 HISTORY — PX: EXTRACORPOREAL SHOCK WAVE LITHOTRIPSY: SHX1557

## 2019-02-13 SURGERY — LITHOTRIPSY, ESWL
Anesthesia: LOCAL | Laterality: Right

## 2019-02-13 MED ORDER — DIPHENHYDRAMINE HCL 25 MG PO CAPS
25.0000 mg | ORAL_CAPSULE | ORAL | Status: AC
  Administered 2019-02-13: 25 mg via ORAL
  Filled 2019-02-13: qty 1

## 2019-02-13 MED ORDER — CIPROFLOXACIN HCL 500 MG PO TABS
500.0000 mg | ORAL_TABLET | ORAL | Status: AC
  Administered 2019-02-13: 500 mg via ORAL
  Filled 2019-02-13: qty 1

## 2019-02-13 MED ORDER — DIAZEPAM 5 MG PO TABS
10.0000 mg | ORAL_TABLET | ORAL | Status: AC
  Administered 2019-02-13: 10 mg via ORAL
  Filled 2019-02-13: qty 2

## 2019-02-13 MED ORDER — SODIUM CHLORIDE 0.9 % IV SOLN
INTRAVENOUS | Status: DC
  Administered 2019-02-13: 07:00:00 via INTRAVENOUS

## 2019-02-13 NOTE — H&P (Signed)
I have kidney stones.  HPI: Andrew Hogan is a 48 year-old male established patient who is here for renal calculi.  The problem is on the right side.   He has a 48m right mid renal stone but has no pain or hematuria. He had a left ureteral stone treated with ESWL and URS about 6 months ago. He brought his stone for analysis. He has increased his fluid intake to 3 liters daily and has nocturia x 3. He had one episode of right flank pain a month ago. He has no associated signs or symptoms.      ALLERGIES: None    MEDICATIONS: None   GU PSH: Cysto Remove Stent FB Sim - 07/25/2018 ESWL, Left - 06/24/2018 Ureteroscopic laser litho, Left - 07/18/2018 Ureteroscopic stone removal, Left - 07/18/2018     NON-GU PSH: None   GU PMH: Chronic kidney disease stage 3 (GFR 30-60), His cr was 1.8 on his labs in 11/19. I will repeat today. - 09/02/2018 Renal calculus, Right, He has only one small residual LLP fragment on KUB and no hydro. He has a 145mright mid renal stone and I will get him set up for ESWL. He will have a repeat hypercalcuria profile today to reassess his Cr and Uric acid and will do a 24hr urine for stone risk analysis. he will bring his stone for analysis. I reviewed the risks of ESWL including bleeding, infection, injury to the kidney or adjacent structures, failure to fragment the stone, need for ancillary procedures, thrombotic events, cardiac arrhythmias and sedation complications. - 09/02/2018, - 07/25/2018, Bilateral renal stones without obstruction but with growth since 2015. He will need a metabolic w/u going forward when he has cleared the ureteral stone. Hypercalcuria profile today. He will probably need a left PCNL and right ESWL at some point. , - 06/10/2018 Ureteral calculus (Improving), Left, The treated stone has only a small LLP residual fragment. - 09/02/2018, 6 x 59m759meft distal stone that has progressed from the proximal ureter. I discussed MET, ESWL and URS and will have him  return in 1-2 weeks for a KUB. , - 06/10/2018 Low back pain, Right, His back pain seems MSK in nature. No right ureteral calculi seen. He was provided flexeril and pain medication for this today. PCP f/u also recommended. - 07/08/2018 Renal and ureteral calculus - 06/17/2018    NON-GU PMH: Hyperuricemia, Recheck today and if still up, he will need to be considered for allopurinol. - 09/02/2018 Gout    FAMILY HISTORY: Death - Father    Notes: Mother still living at age 64 14ather deceased at 81 73SOCIAL HISTORY: Marital Status: Married Preferred Language: English; Ethnicity: Not Hispanic Or Latino; Race: Asian Current Smoking Status: Patient has never smoked.   Tobacco Use Assessment Completed: Used Tobacco in last 30 days? Has never drank.  Does not drink caffeine. 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  Gastrointestinal (Upper):   Patient denies nausea, vomiting, and indigestion/ heartburn.  Gastrointestinal (Lower):   Patient denies diarrhea and constipation.  Constitutional:   Patient denies fever, night sweats, weight loss, and fatigue.  Skin:   Patient denies skin rash/ lesion and itching.  Eyes:   Patient denies blurred vision and double vision.  Ears/ Nose/ Throat:   Patient denies sore throat and sinus problems.  Hematologic/Lymphatic:   Patient denies easy bruising and swollen glands.  Cardiovascular:   Patient denies leg swelling and chest pains.  Respiratory:   Patient denies cough and shortness of breath.  Endocrine:   Patient denies excessive thirst.  Musculoskeletal:   Patient denies back pain and joint pain.  Neurological:   Patient denies headaches and dizziness.   Psychologic:   Patient denies depression and anxiety.   VITAL SIGNS:      01/29/2019 12:45 PM  BP 122/80 mmHg  Pulse 74 /min   MULTI-SYSTEM PHYSICAL EXAMINATION:    Constitutional: Well-nourished. No physical deformities. Normally developed. Good grooming.   Respiratory: Normal breath sounds. No labored breathing, no use of accessory muscles.   Cardiovascular: Regular rate and rhythm. No murmur, no gallop.      PAST DATA REVIEWED:  Source Of History:  Patient  Urine Test Review:   Urinalysis  X-Ray Review: KUB: Reviewed Films. Discussed With Patient.     PROCEDURES:         KUB - [id number]  A single view of the abdomen is obtained. The 31m right renal stone has moved into the upper ureter. No other stones are seen. He has no bone, gas or soft tissue abnormalities.       Patient confirmed No Neulasta OnPro Device.            Urinalysis Dipstick Dipstick Cont'd  Color: Yellow Bilirubin: Neg mg/dL  Appearance: Clear Ketones: Neg mg/dL  Specific Gravity: 1.020 Blood: Neg ery/uL  pH: <=5.0 Protein: Neg mg/dL  Glucose: Neg mg/dL Urobilinogen: 0.2 mg/dL    Nitrites: Neg    Leukocyte Esterase: Neg leu/uL    ASSESSMENT:      ICD-10 Details  1 GU:   Renal calculus - N20.0 Worsening - His stone has moved into the UPJ/proximal ureter. I am going to get him set up for ESWL. I reviewed the risks of ESWL including bleeding, infection, injury to the kidney or adjacent structures, failure to fragment the stone, need for ancillary procedures, thrombotic events, cardiac arrhythmias and sedation complications.   3   Chronic kidney disease stage 3 (GFR 30-60) - N18.3   2 NON-GU:   Hyperuricemia - E79.0 I am going to repeat a hypercalcuria profile today. Stone sent for analysis.    PLAN:           Orders Labs SFoot Locker Hypercalciura Profile  X-Rays: KUB          Schedule Return Visit/Planned Activity: Next Available Appointment - Schedule Surgery             Note: right ESWL.

## 2019-02-13 NOTE — Discharge Instructions (Signed)
See [facility] discharge instructions in chart.  

## 2019-02-13 NOTE — Op Note (Signed)
See [facility] OP note scanned into chart. Also because of the size, density, location and other factors that cannot be anticipated I feel this will likely be a staged procedure. This fact supersedes any indication in the scanned [facility] operative note to the contrary.  

## 2019-02-14 ENCOUNTER — Encounter (HOSPITAL_COMMUNITY): Payer: Self-pay | Admitting: Urology

## 2019-03-10 ENCOUNTER — Other Ambulatory Visit: Payer: Self-pay | Admitting: Urology

## 2019-03-17 ENCOUNTER — Other Ambulatory Visit (HOSPITAL_COMMUNITY)
Admission: RE | Admit: 2019-03-17 | Discharge: 2019-03-17 | Disposition: A | Discharge status: Home / Self Care | Payer: BC Managed Care – PPO | Source: Ambulatory Visit | Attending: Urology | Admitting: Urology

## 2019-03-17 DIAGNOSIS — Z20828 Contact with and (suspected) exposure to other viral communicable diseases: Secondary | ICD-10-CM | POA: Insufficient documentation

## 2019-03-17 DIAGNOSIS — Z01812 Encounter for preprocedural laboratory examination: Secondary | ICD-10-CM | POA: Insufficient documentation

## 2019-03-17 LAB — SARS CORONAVIRUS 2 (TAT 6-24 HRS): SARS Coronavirus 2: NEGATIVE

## 2019-03-18 ENCOUNTER — Encounter (HOSPITAL_BASED_OUTPATIENT_CLINIC_OR_DEPARTMENT_OTHER): Payer: Self-pay | Admitting: *Deleted

## 2019-03-19 ENCOUNTER — Encounter (HOSPITAL_BASED_OUTPATIENT_CLINIC_OR_DEPARTMENT_OTHER): Payer: Self-pay | Admitting: *Deleted

## 2019-03-19 ENCOUNTER — Other Ambulatory Visit: Payer: Self-pay

## 2019-03-19 NOTE — Progress Notes (Addendum)
Npo after mdinight arrive 730 am 03-20-19 [facility] Needs I stat 4 Has surgery orders in epic 03-07-14 ekg on chart hx of left posterior fasicular block, no significant change since las t tracing Wife lorelin driver cell [phone]

## 2019-03-19 NOTE — H&P (Signed)
CC: I have kidney stones.  HPI: Andrew Hogan is a 48 year-old male established patient who is here for renal calculi.  01/29/19:  He has a 65m right mid renal stone but has no pain or hematuria. He had a left ureteral stone treated with ESWL and URS about 6 months ago. He brought his stone for analysis. He has increased his fluid intake to 3 liters daily and has nocturia x 3. He had one episode of right flank pain a month ago. He has no associated signs or symptoms.   02/27/19:  This patient is two weeks s/p right ESWL, which he underwent on 02/13/19. He passed some sandy substance, but otherwise he has remained asymptomatic. He denies dysuria, gross hematuria, or painful voiding. He denies fever, chills, nausea, or vomiting.   The problem is on the right side.     ALLERGIES: None    MEDICATIONS: No Medications    GU PSH: Cysto Remove Stent FB Sim - 07/25/2018 ESWL, Right - 02/13/2019, Left - 06/24/2018 Ureteroscopic laser litho, Left - 07/18/2018 Ureteroscopic stone removal, Left - 07/18/2018     NON-GU PSH: No Non-GU PSH    GU PMH: Renal calculus (Worsening), His stone has moved into the UPJ/proximal ureter. I am going to get him set up for ESWL. I reviewed the risks of ESWL including bleeding, infection, injury to the kidney or adjacent structures, failure to fragment the stone, need for ancillary procedures, thrombotic events, cardiac arrhythmias and sedation complications. - 01/29/2019, Right, He has only one small residual LLP fragment on KUB and no hydro. He has a 125mright mid renal stone and I will get him set up for ESWL. He will have a repeat hypercalcuria profile today to reassess his Cr and Uric acid and will do a 24hr urine for stone risk analysis. he will bring his stone for analysis. I reviewed the risks of ESWL including bleeding, infection, injury to the kidney or adjacent structures, failure to fragment the stone, need for ancillary procedures, thrombotic events, cardiac  arrhythmias and sedation complications. , - 09/02/2018, - 07/25/2018, Bilateral renal stones without obstruction but with growth since 2015. He will need a metabolic w/u going forward when he has cleared the ureteral stone. Hypercalcuria profile today. He will probably need a left PCNL and right ESWL at some point. , - 06/10/2018 Chronic kidney disease stage 3 (GFR 30-60), His cr was 1.8 on his labs in 11/19. I will repeat today. - 09/02/2018 Ureteral calculus (Improving), Left, The treated stone has only a small LLP residual fragment. - 09/02/2018, 6 x 23m123meft distal stone that has progressed from the proximal ureter. I discussed MET, ESWL and URS and will have him return in 1-2 weeks for a KUB. , - 06/10/2018 Low back pain, Right, His back pain seems MSK in nature. No right ureteral calculi seen. He was provided flexeril and pain medication for this today. PCP f/u also recommended. - 07/08/2018 Renal and ureteral calculus - 06/17/2018    NON-GU PMH: Hyperuricemia, Recheck today and if still up, he will need to be considered for allopurinol. - 09/02/2018 Gout    FAMILY HISTORY: Death - Father    Notes: Mother still living at age 34 66ather deceased at 81 30SOCIAL HISTORY: Marital Status: Married Preferred Language: English; Ethnicity: Not Hispanic Or Latino; Race: Asian Current Smoking Status: Patient has never smoked.   Tobacco Use Assessment Completed: Used Tobacco in last 30 days? Has never drank.  Does not drink  caffeine. Patient's occupation Architect.    REVIEW OF SYSTEMS:    GU Review Male:   Patient denies frequent urination, hard to postpone urination, burning/ pain with urination, get up at night to urinate, leakage of urine, stream starts and stops, trouble starting your stream, have to strain to urinate , erection problems, and penile pain.  Gastrointestinal (Upper):   Patient denies indigestion/ heartburn, vomiting, and nausea.  Gastrointestinal (Lower):   Patient denies  diarrhea and constipation.  Constitutional:   Patient denies fever, night sweats, weight loss, and fatigue.  Skin:   Patient denies skin rash/ lesion and itching.  Eyes:   Patient denies blurred vision and double vision.  Ears/ Nose/ Throat:   Patient denies sore throat and sinus problems.  Hematologic/Lymphatic:   Patient denies swollen glands and easy bruising.  Cardiovascular:   Patient denies leg swelling and chest pains.  Respiratory:   Patient denies cough and shortness of breath.  Endocrine:   Patient denies excessive thirst.  Musculoskeletal:   Patient denies back pain and joint pain.  Neurological:   Patient denies headaches and dizziness.  Psychologic:   Patient denies depression and anxiety.   VITAL SIGNS:      02/27/2019 08:17 AM  Weight 213 lb / 96.62 kg  Height 71 in / 180.34 cm  BP 115/73 mmHg  Pulse 60 /min  Temperature 97.7 F / 36.5 C  BMI 29.7 kg/m   MULTI-SYSTEM PHYSICAL EXAMINATION:    Constitutional: Well-nourished. No physical deformities. Normally developed. Good grooming.  Neck: Neck symmetrical, not swollen. Normal tracheal position.  Respiratory: No labored breathing, no use of accessory muscles.   Neurologic / Psychiatric: Oriented to time, oriented to place, oriented to person. No depression, no anxiety, no agitation.  Musculoskeletal: Normal gait and station of head and neck.     PAST DATA REVIEWED:  Source Of History:  Patient  Lab Test Review:   BUN, Creatinine and GFR  Urine Test Review:   Urinalysis, Urine Culture   PROCEDURES:         KUB - [id number]  A single view of the abdomen is obtained. Right ureteral stone persists.      Patient confirmed No Neulasta OnPro Device.            Urinalysis w/Scope Dipstick Dipstick Cont'd Micro  Color: Yellow Bilirubin: Neg mg/dL WBC/hpf: 0 - 5/hpf  Appearance: Clear Ketones: Neg mg/dL RBC/hpf: 20 - 40/hpf  Specific Gravity: 1.020 Blood: 2+ ery/uL Bacteria: NS (Not Seen)  pH: <=5.0 Protein: Neg  mg/dL Cystals: NS (Not Seen)  Glucose: Neg mg/dL Urobilinogen: 0.2 mg/dL Casts: NS (Not Seen)    Nitrites: Neg Trichomonas: Not Present    Leukocyte Esterase: Neg leu/uL Mucous: Not Present      Epithelial Cells: 0 - 5/hpf      Yeast: NS (Not Seen)      Sperm: Not Present    ASSESSMENT:      ICD-10 Details  1 GU:   Renal calculus - N20.0    PLAN:           Orders Labs Urine Culture, BUN, Creatinine and GFR          Document Letter(s):  Created for Patient: Clinical Summary         Notes:   This patient is two weeks s/p right ESWL, which he underwent on 02/13/19. He passed some sandy material, and he remains asymptomatic. KUB indicates persistent right renal calculus. Will discuss with his urologist.  Per Dr. Jeffie Pollock, will plan for URS. Patient has been informed.

## 2019-03-20 ENCOUNTER — Ambulatory Visit (HOSPITAL_BASED_OUTPATIENT_CLINIC_OR_DEPARTMENT_OTHER): Payer: BC Managed Care – PPO | Admitting: Certified Registered Nurse Anesthetist

## 2019-03-20 ENCOUNTER — Encounter (HOSPITAL_BASED_OUTPATIENT_CLINIC_OR_DEPARTMENT_OTHER)
Admission: RE | Disposition: A | Discharge status: Home / Self Care | Payer: Self-pay | Source: Home / Self Care | Attending: Urology

## 2019-03-20 ENCOUNTER — Ambulatory Visit (HOSPITAL_COMMUNITY): Payer: BC Managed Care – PPO

## 2019-03-20 ENCOUNTER — Ambulatory Visit (HOSPITAL_BASED_OUTPATIENT_CLINIC_OR_DEPARTMENT_OTHER)
Admission: RE | Admit: 2019-03-20 | Discharge: 2019-03-20 | Disposition: A | Discharge status: Home / Self Care | Payer: BC Managed Care – PPO | Attending: Urology | Admitting: Urology

## 2019-03-20 ENCOUNTER — Other Ambulatory Visit: Payer: Self-pay

## 2019-03-20 ENCOUNTER — Encounter (HOSPITAL_BASED_OUTPATIENT_CLINIC_OR_DEPARTMENT_OTHER): Payer: Self-pay | Admitting: Certified Registered Nurse Anesthetist

## 2019-03-20 DIAGNOSIS — N183 Chronic kidney disease, stage 3 (moderate): Secondary | ICD-10-CM | POA: Diagnosis not present

## 2019-03-20 DIAGNOSIS — Z87442 Personal history of urinary calculi: Secondary | ICD-10-CM | POA: Diagnosis not present

## 2019-03-20 DIAGNOSIS — N209 Urinary calculus, unspecified: Secondary | ICD-10-CM

## 2019-03-20 DIAGNOSIS — N201 Calculus of ureter: Secondary | ICD-10-CM | POA: Insufficient documentation

## 2019-03-20 HISTORY — DX: Abnormal electrocardiogram (ECG) (EKG): R94.31

## 2019-03-20 HISTORY — DX: Calculus of kidney: N20.0

## 2019-03-20 HISTORY — PX: CYSTOSCOPY/URETEROSCOPY/HOLMIUM LASER/STENT PLACEMENT: SHX6546

## 2019-03-20 HISTORY — DX: Chronic kidney disease, stage 3 unspecified: N18.30

## 2019-03-20 LAB — POCT I-STAT 4, (NA,K, GLUC, HGB,HCT)
Glucose, Bld: 96 mg/dL (ref 70–99)
HCT: 42 % (ref 39.0–52.0)
Hemoglobin: 14.3 g/dL (ref 13.0–17.0)
Potassium: 3.8 mmol/L (ref 3.5–5.1)
Sodium: 142 mmol/L (ref 135–145)

## 2019-03-20 SURGERY — CYSTOSCOPY/URETEROSCOPY/HOLMIUM LASER/STENT PLACEMENT
Anesthesia: General | Laterality: Right

## 2019-03-20 MED ORDER — MIDAZOLAM HCL 2 MG/2ML IJ SOLN
0.5000 mg | Freq: Once | INTRAMUSCULAR | Status: DC | PRN
  Filled 2019-03-20: qty 2

## 2019-03-20 MED ORDER — IOHEXOL 300 MG/ML  SOLN
INTRAMUSCULAR | Status: DC | PRN
  Administered 2019-03-20: 3 mL via URETHRAL

## 2019-03-20 MED ORDER — PROPOFOL 10 MG/ML IV BOLUS
INTRAVENOUS | Status: AC
  Filled 2019-03-20: qty 20

## 2019-03-20 MED ORDER — OXYCODONE HCL 5 MG PO TABS: 5.0000 mg | ORAL_TABLET | Freq: Four times a day (QID) | ORAL | 0 refills | Status: AC | PRN

## 2019-03-20 MED ORDER — PROMETHAZINE HCL 25 MG/ML IJ SOLN
6.2500 mg | INTRAMUSCULAR | Status: DC | PRN
  Filled 2019-03-20: qty 1

## 2019-03-20 MED ORDER — DEXAMETHASONE SODIUM PHOSPHATE 10 MG/ML IJ SOLN
INTRAMUSCULAR | Status: AC
  Filled 2019-03-20: qty 1

## 2019-03-20 MED ORDER — FENTANYL CITRATE (PF) 100 MCG/2ML IJ SOLN
INTRAMUSCULAR | Status: DC | PRN
  Administered 2019-03-20 (×4): 25 ug via INTRAVENOUS

## 2019-03-20 MED ORDER — HYDROMORPHONE HCL 1 MG/ML IJ SOLN
0.2500 mg | INTRAMUSCULAR | Status: DC | PRN
  Filled 2019-03-20: qty 0.5

## 2019-03-20 MED ORDER — MEPERIDINE HCL 25 MG/ML IJ SOLN
6.2500 mg | INTRAMUSCULAR | Status: DC | PRN
  Filled 2019-03-20: qty 1

## 2019-03-20 MED ORDER — LIDOCAINE 2% (20 MG/ML) 5 ML SYRINGE
INTRAMUSCULAR | Status: AC
  Filled 2019-03-20: qty 5

## 2019-03-20 MED ORDER — HYDROCODONE-ACETAMINOPHEN 7.5-325 MG PO TABS
1.0000 | ORAL_TABLET | Freq: Once | ORAL | Status: DC | PRN
  Filled 2019-03-20: qty 1

## 2019-03-20 MED ORDER — DEXAMETHASONE SODIUM PHOSPHATE 10 MG/ML IJ SOLN
INTRAMUSCULAR | Status: DC | PRN
  Administered 2019-03-20: 10 mg via INTRAVENOUS

## 2019-03-20 MED ORDER — MIDAZOLAM HCL 2 MG/2ML IJ SOLN
INTRAMUSCULAR | Status: AC
  Filled 2019-03-20: qty 2

## 2019-03-20 MED ORDER — ONDANSETRON HCL 4 MG/2ML IJ SOLN
INTRAMUSCULAR | Status: DC | PRN
  Administered 2019-03-20: 4 mg via INTRAVENOUS

## 2019-03-20 MED ORDER — PHENAZOPYRIDINE HCL 200 MG PO TABS: 200.0000 mg | ORAL_TABLET | Freq: Three times a day (TID) | ORAL | 1 refills | Status: AC | PRN

## 2019-03-20 MED ORDER — CEFAZOLIN SODIUM-DEXTROSE 2-4 GM/100ML-% IV SOLN
INTRAVENOUS | Status: AC
  Filled 2019-03-20: qty 100

## 2019-03-20 MED ORDER — FENTANYL CITRATE (PF) 100 MCG/2ML IJ SOLN
INTRAMUSCULAR | Status: AC
  Filled 2019-03-20: qty 2

## 2019-03-20 MED ORDER — SODIUM CHLORIDE 0.9 % IR SOLN
Status: DC | PRN
  Administered 2019-03-20 (×2): 3000 mL via INTRAVESICAL

## 2019-03-20 MED ORDER — SODIUM CHLORIDE 0.9 % IV SOLN
INTRAVENOUS | Status: DC
  Administered 2019-03-20 (×2): via INTRAVENOUS
  Filled 2019-03-20: qty 1000

## 2019-03-20 MED ORDER — PHENYLEPHRINE 40 MCG/ML (10ML) SYRINGE FOR IV PUSH (FOR BLOOD PRESSURE SUPPORT)
PREFILLED_SYRINGE | INTRAVENOUS | Status: AC
  Filled 2019-03-20: qty 10

## 2019-03-20 MED ORDER — ONDANSETRON HCL 4 MG/2ML IJ SOLN
INTRAMUSCULAR | Status: AC
  Filled 2019-03-20: qty 2

## 2019-03-20 MED ORDER — PROPOFOL 10 MG/ML IV BOLUS
INTRAVENOUS | Status: DC | PRN
  Administered 2019-03-20: 200 mg via INTRAVENOUS

## 2019-03-20 MED ORDER — MIDAZOLAM HCL 2 MG/2ML IJ SOLN
INTRAMUSCULAR | Status: DC | PRN
  Administered 2019-03-20: 2 mg via INTRAVENOUS

## 2019-03-20 MED ORDER — SODIUM CHLORIDE 0.9% FLUSH
3.0000 mL | Freq: Two times a day (BID) | INTRAVENOUS | Status: DC
  Filled 2019-03-20: qty 3

## 2019-03-20 MED ORDER — CEFAZOLIN SODIUM-DEXTROSE 2-4 GM/100ML-% IV SOLN
2.0000 g | INTRAVENOUS | Status: AC
  Administered 2019-03-20: 2 g via INTRAVENOUS
  Filled 2019-03-20: qty 100

## 2019-03-20 MED ORDER — LIDOCAINE HCL (CARDIAC) PF 100 MG/5ML IV SOSY
PREFILLED_SYRINGE | INTRAVENOUS | Status: DC | PRN
  Administered 2019-03-20: 100 mg via INTRAVENOUS

## 2019-03-20 MED ORDER — PHENYLEPHRINE 40 MCG/ML (10ML) SYRINGE FOR IV PUSH (FOR BLOOD PRESSURE SUPPORT)
PREFILLED_SYRINGE | INTRAVENOUS | Status: DC | PRN
  Administered 2019-03-20 (×3): 80 ug via INTRAVENOUS

## 2019-03-20 MED ORDER — ONDANSETRON HCL 4 MG PO TABS: 4.0000 mg | ORAL_TABLET | Freq: Three times a day (TID) | ORAL | 1 refills | Status: AC | PRN

## 2019-03-20 SURGICAL SUPPLY — 26 items
BAG DRAIN URO-CYSTO SKYTR STRL (DRAIN) ×3 IMPLANT
BASKET STONE 1.7 NGAGE (UROLOGICAL SUPPLIES) ×2 IMPLANT
BASKET STONE NCOMPASS (UROLOGICAL SUPPLIES) ×2 IMPLANT
BASKET ZERO TIP NITINOL 2.4FR (BASKET) IMPLANT
CATH URET 5FR 28IN CONE TIP (BALLOONS)
CATH URET 5FR 28IN OPEN ENDED (CATHETERS) IMPLANT
CATH URET 5FR 70CM CONE TIP (BALLOONS) IMPLANT
CLOTH BEACON ORANGE TIMEOUT ST (SAFETY) ×3 IMPLANT
ELECT REM PT RETURN 9FT ADLT (ELECTROSURGICAL)
ELECTRODE REM PT RTRN 9FT ADLT (ELECTROSURGICAL) IMPLANT
FIBER LASER FLEXIVA 365 (UROLOGICAL SUPPLIES) IMPLANT
FIBER LASER TRAC TIP (UROLOGICAL SUPPLIES) ×2 IMPLANT
GLOVE SURG SS PI 8.0 STRL IVOR (GLOVE) ×3 IMPLANT
GOWN STRL REUS W/TWL XL LVL3 (GOWN DISPOSABLE) ×3 IMPLANT
GUIDEWIRE ANG ZIPWIRE 038X150 (WIRE) IMPLANT
GUIDEWIRE STR DUAL SENSOR (WIRE) ×3 IMPLANT
IV NS IRRIG 3000ML ARTHROMATIC (IV SOLUTION) ×3 IMPLANT
KIT TURNOVER CYSTO (KITS) ×3 IMPLANT
MANIFOLD NEPTUNE II (INSTRUMENTS) ×3 IMPLANT
NS IRRIG 500ML POUR BTL (IV SOLUTION) ×3 IMPLANT
PACK CYSTO (CUSTOM PROCEDURE TRAY) ×3 IMPLANT
SHEATH URET ACCESS 12FR/35CM (UROLOGICAL SUPPLIES) ×2 IMPLANT
STENT URET 6FRX26 CONTOUR (STENTS) ×2 IMPLANT
TUBE CONNECTING 12'X1/4 (SUCTIONS) ×1
TUBE CONNECTING 12X1/4 (SUCTIONS) ×2 IMPLANT
TUBING UROLOGY SET (TUBING) IMPLANT

## 2019-03-20 NOTE — Anesthesia Postprocedure Evaluation (Signed)
Anesthesia Post Note  Patient: Andrew Hogan  Procedure(s) Performed: CYSTOSCOPY/URETEROSCOPY/HOLMIUM LASER/STENT PLACEMENT BASKET STONE EXTRACTION (Right )     Patient location during evaluation: PACU Anesthesia Type: General Level of consciousness: awake and alert, oriented and patient cooperative Pain management: pain level controlled Vital Signs Assessment: post-procedure vital signs reviewed and stable Respiratory status: spontaneous breathing, nonlabored ventilation and respiratory function stable Cardiovascular status: blood pressure returned to baseline and stable Postop Assessment: no apparent nausea or vomiting Anesthetic complications: no    Last Vitals:  Vitals:   03/20/19 1045 03/20/19 1100  BP: (!) 137/94 (!) 132/94  Pulse: 82 71  Resp: 18 16  Temp: 36.5 C   SpO2: 100% 94%    Last Pain:  Vitals:   03/20/19 1045  TempSrc:   PainSc: 0-No pain                 Kadynce Bonds,E. Lucy Boardman

## 2019-03-20 NOTE — Transfer of Care (Signed)
Immediate Anesthesia Transfer of Care Note  Patient: Andrew Hogan  Procedure(s) Performed: CYSTOSCOPY/URETEROSCOPY/HOLMIUM LASER/STENT PLACEMENT BASKET STONE EXTRACTION (Right )  Patient Location: PACU  Anesthesia Type:General  Level of Consciousness: awake, alert , oriented, drowsy and patient cooperative  Airway & Oxygen Therapy: Patient Spontanous Breathing and Patient connected to nasal cannula oxygen  Post-op Assessment: Report given to RN and Post -op Vital signs reviewed and stable  Post vital signs: Reviewed and stable  Last Vitals:  Vitals Value Taken Time  BP    Temp    Pulse 82 03/20/19 1046  Resp 18 03/20/19 1046  SpO2 100 % 03/20/19 1046  Vitals shown include unvalidated device data.  Last Pain:  Vitals:   03/20/19 0754  TempSrc: Oral      Patients Stated Pain Goal: 4 (37/04/88 8916)  Complications: No apparent anesthesia complications

## 2019-03-20 NOTE — Discharge Instructions (Addendum)
Ureteral Stent Implantation, Care After °This sheet gives you information about how to care for yourself after your procedure. Your health care provider may also give you more specific instructions. If you have problems or questions, contact your health care provider. °What can I expect after the procedure? °After the procedure, it is common to have: °· Nausea. °· Mild pain when you urinate. You may feel this pain in your lower back or lower abdomen. The pain should stop within a few minutes after you urinate. This may last for up to 1 week. °· A small amount of blood in your urine for several days. °Follow these instructions at home: °Medicines °· Take over-the-counter and prescription medicines only as told by your health care provider. °· If you were prescribed an antibiotic medicine, take it as told by your health care provider. Do not stop taking the antibiotic even if you start to feel better. °· Do not drive for 24 hours if you were given a sedative during your procedure. °· Ask your health care provider if the medicine prescribed to you requires you to avoid driving or using heavy machinery. °Activity °· Rest as told by your health care provider. °· Avoid sitting for a long time without moving. Get up to take short walks every 1-2 hours. This is important to improve blood flow and breathing. Ask for help if you feel weak or unsteady. °· Return to your normal activities as told by your health care provider. Ask your health care provider what activities are safe for you. °General instructions ° °· Watch for any blood in your urine. Call your health care provider if the amount of blood in your urine increases. °· If you have a catheter: °? Follow instructions from your health care provider about taking care of your catheter and collection bag. °? Do not take baths, swim, or use a hot tub until your health care provider approves. Ask your health care provider if you may take showers. You may only be allowed to  take sponge baths. °· Drink enough fluid to keep your urine pale yellow. °· Do not use any products that contain nicotine or tobacco, such as cigarettes, e-cigarettes, and chewing tobacco. These can delay healing after surgery. If you need help quitting, ask your health care provider. °· Keep all follow-up visits as told by your health care provider. This is important. °Contact a health care provider if: °· You have pain that gets worse or does not get better with medicine, especially pain when you urinate. °· You have difficulty urinating. °· You feel nauseous or you vomit repeatedly during a period of more than 2 days after the procedure. °Get help right away if: °· Your urine is dark red or has blood clots in it. °· You are leaking urine (have incontinence). °· The end of the stent comes out of your urethra. °· You cannot urinate. °· You have sudden, sharp, or severe pain in your abdomen or lower back. °· You have a fever. °· You have swelling or pain in your legs. °· You have difficulty breathing. °Summary °· After the procedure, it is common to have mild pain when you urinate that goes away within a few minutes after you urinate. This may last for up to 1 week. °· Watch for any blood in your urine. Call your health care provider if the amount of blood in your urine increases. °· Take over-the-counter and prescription medicines only as told by your health care provider. °· Drink   enough fluid to keep your urine pale yellow.  The office will contact you to set up follow up for removal of the stent by office cystoscopy next week.   Generally this is done with local anesthetic jelly in the urethra.   If you don't feel that you are up to that, please let me know and I can either send in valium to help relax you for the procedure or we can use Nitrous oxide which has a shorter effect and you can still drive after the procedure with the nitrous.  There is an additional charge that is not generally covered by  insurance for the nitrous oxide and we need to have the procedure scheduled with nitrous if you wanted to go that route, but most folks do fine with just the local anesthetic.   This information is not intended to replace advice given to you by your health care provider. Make sure you discuss any questions you have with your health care provider. Document Released: 03/19/2013 Document Revised: 04/23/2018 Document Reviewed: 04/24/2018 Elsevier Patient Education  [place] Instructions  Activity: Get plenty of rest for the remainder of the [place]. A responsible individual must stay with you for 24 hours following the procedure.  For the next 24 hours, DO NOT: -Drive a car -Paediatric nurse -Drink alcoholic beverages -Take any medication unless instructed by your physician -Make any legal decisions or sign important papers.  Meals: Start with liquid foods such as gelatin or soup. Progress to regular foods as tolerated. Avoid greasy, spicy, heavy foods. If nausea and/or vomiting occur, drink only clear liquids until the nausea and/or vomiting subsides. Call your physician if vomiting continues.  Special Instructions/Symptoms: Your throat may feel dry or sore from the anesthesia or the breathing tube placed in your throat during surgery. If this causes discomfort, gargle with warm salt water. The discomfort should disappear within 24 hours.  If you had a scopolamine patch placed behind your ear for the management of post- operative nausea and/or vomiting:  1. The medication in the patch is effective for 72 hours, after which it should be removed.  Wrap patch in a tissue and discard in the trash. Wash hands thoroughly with soap and water. 2. You may remove the patch earlier than 72 hours if you experience unpleasant side effects which may include dry mouth, dizziness or visual disturbances. 3. Avoid touching the patch. Wash your hands with soap and water  after contact with the patch.

## 2019-03-20 NOTE — Anesthesia Preprocedure Evaluation (Addendum)
Anesthesia Evaluation  Patient identified by MRN, date of birth, ID band Patient awake    Reviewed: Allergy & Precautions, NPO status , Patient's Chart, lab work & pertinent test results  History of Anesthesia Complications Negative for: history of anesthetic complications  Airway Mallampati: I  TM Distance: >3 FB Neck ROM: Full    Dental  (+) Dental Advisory Given   Pulmonary neg pulmonary ROS,  03/17/2019 SARS coronavirus NEG   breath sounds clear to auscultation       Cardiovascular (-) anginanegative cardio ROS   Rhythm:Regular Rate:Normal     Neuro/Psych negative neurological ROS     GI/Hepatic negative GI ROS, Neg liver ROS,   Endo/Other  negative endocrine ROS  Renal/GU Renal Insufficiencystones     Musculoskeletal   Abdominal   Peds  Hematology negative hematology ROS (+)   Anesthesia Other Findings   Reproductive/Obstetrics                            Anesthesia Physical Anesthesia Plan  ASA: I  Anesthesia Plan: General   Post-op Pain Management:    Induction: Intravenous  PONV Risk Score and Plan: 2 and Dexamethasone and Ondansetron  Airway Management Planned: LMA  Additional Equipment:   Intra-op Plan:   Post-operative Plan:   Informed Consent: I have reviewed the patients History and Physical, chart, labs and discussed the procedure including the risks, benefits and alternatives for the proposed anesthesia with the patient or authorized representative who has indicated his/her understanding and acceptance.     Dental advisory given  Plan Discussed with: CRNA and Surgeon  Anesthesia Plan Comments:         Anesthesia Quick Evaluation

## 2019-03-20 NOTE — Interval H&P Note (Signed)
History and Physical Interval Note:  03/20/2019 8:01 AM  Andrew Hogan  has presented today for surgery, with the diagnosis of RIGHT RENAL STONE.  The various methods of treatment have been discussed with the patient and family. After consideration of risks, benefits and other options for treatment, the patient has consented to  Procedure(s): CYSTOSCOPY/URETEROSCOPY/HOLMIUM LASER/STENT PLACEMENT (Right) as a surgical intervention.  The patient's history has been reviewed, patient examined, no change in status, stable for surgery.  I have reviewed the patient's chart and labs.  Questions were answered to the patient's satisfaction.     Irine Seal

## 2019-03-20 NOTE — Anesthesia Procedure Notes (Signed)
Procedure Name: LMA Insertion Date/Time: 03/20/2019 9:25 AM Performed by: Raenette Rover, CRNA Pre-anesthesia Checklist: Patient identified, Emergency Drugs available, Suction available and Patient being monitored Patient Re-evaluated:Patient Re-evaluated prior to induction Oxygen Delivery Method: Circle system utilized Preoxygenation: Pre-oxygenation with 100% oxygen Induction Type: IV induction LMA: LMA inserted LMA Size: 4.0 Number of attempts: 1 Placement Confirmation: positive ETCO2,  CO2 detector and breath sounds checked- equal and bilateral Tube secured with: Tape Dental Injury: Teeth and Oropharynx as per pre-operative assessment

## 2019-03-20 NOTE — Op Note (Signed)
Procedure: 1.  Cystoscopy with right retrograde pyelogram and interpretation. 2.  Right ureteroscopy with holmium laser application, stone extraction and insertion of right double-J stent.  Preop diagnosis: Right proximal ureteral stone.  Postop diagnosis: Same.  Surgeon: Dr. Irine Seal.  Anesthesia: General.  Specimen: Stone fragments.  Drain: 6 [place] by 26 cm right contour double-J stent.  EBL: Minimal.  Complications: None.  Indications: The patient is a 48 year old male who has a 8 mm right proximal ureteral stone that was initially treated with lithotripsy with insignificant fragmentation.  He is elected to undergo ureteroscopy for treatment of the stone.  A KUB today revealed no progression of the stone from the proximal ureter.  Procedure: He was given 2 g of Ancef.  A general anesthetic was induced.  He was placed in lithotomy position and fitted with PAS hose.  His perineum and genitalia were prepped with Betadine solution he was draped in usual sterile fashion.  Cystoscopy was performed using a 23 [place] scope and 30 degree lens.  Examination revealed a normal urethra.  The external sphincter was intact.  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  Examination of bladder revealed minimal trabeculation.  There were no mucosal lesions noted.  The ureteral orifices were unremarkable.  The right ureteral orifice was cannulated with a 5 French opening catheter and contrast was instilled.  The right retrograde pyelogram demonstrated a normal caliber ureter up to the stone in the proximal ureter.  Contrast flowed by the stone into a dilated proximal ureter and intrarenal collecting system.  A sensor guidewire was advanced to the opening catheter and was easily negotiated by the stone into the kidney.  The cystoscope was removed and a 12 [place] by 35 cm digital access sheath inner core was passed over the  wire but I was only able to get it approximately 4 cm up from the meatus.  I then used the 4.5 [place] single-lumen semirigid ureteroscope which was advanced through the urethra alongside the wire.  I was able to get it up to the level of the stone.  A 200 m tract tip laser fiber was then inserted with the energy level initially set on 0.5 W with a frequency of 10 Hz.  The stone was broken into manageable fragments and several of the distal fragments were removed with an engage basket.  I then increase the rate to 20 Hz and eventually 50 Hz to try to further fragmentation but on fluoroscopy and we ureteroscopy there was still a couple of larger fragments at the impaction site.  An encompass back scope was then used to try to retrieve these larger fragments however they were too large to remove intact.  I was unable to disengage the basket so the sheath was cut and removed and the laser fiber was inserted alongside the basket and additional fragmentation was performed.  Eventually I was able to fragment the stone sufficiently that I was able to remove the basket and the larger fragments.  Final inspection of the ureter revealed only very small fragments and grit in the ureter that was too small to removed with the baskets.  No significant fragments were noted on either ureteroscopic inspection or fluoroscopy.  Ureteroscope was then removed.  The cystoscope was reinserted alongside the wire and several stone fragments were evacuated from the bladder.  The cystoscope was then reinserted over the wire and a 6 [place] by 26 cm contour double-J stent  without tether was passed over the wire to the kidney under fluoroscopic guidance.  The wire was removed, leaving a good coil in the kidney and a good coil in the bladder.  The bladder was drained and the cystoscope was removed.  He was taken down from lithotomy position, his anesthetic was reversed and he was moved to the recovery room in stable condition.  There were no  complications.  The stone fragments will be given to the patient to bring to the office when he comes for stent removal.

## 2019-03-21 ENCOUNTER — Encounter (HOSPITAL_BASED_OUTPATIENT_CLINIC_OR_DEPARTMENT_OTHER): Payer: Self-pay | Admitting: Urology

## 2019-05-14 ENCOUNTER — Other Ambulatory Visit (HOSPITAL_COMMUNITY): Payer: Self-pay | Admitting: Urology

## 2019-05-14 ENCOUNTER — Other Ambulatory Visit: Payer: Self-pay | Admitting: Urology

## 2019-05-14 DIAGNOSIS — N130 Hydronephrosis with ureteropelvic junction obstruction: Secondary | ICD-10-CM

## 2019-05-20 ENCOUNTER — Other Ambulatory Visit: Payer: Self-pay

## 2019-05-20 ENCOUNTER — Encounter (HOSPITAL_COMMUNITY)
Admission: RE | Admit: 2019-05-20 | Discharge: 2019-05-20 | Disposition: A | Discharge status: Home / Self Care | Payer: BC Managed Care – PPO | Source: Ambulatory Visit | Attending: Urology | Admitting: Urology

## 2019-05-20 DIAGNOSIS — N130 Hydronephrosis with ureteropelvic junction obstruction: Secondary | ICD-10-CM | POA: Insufficient documentation

## 2019-05-20 MED ORDER — FUROSEMIDE 10 MG/ML IJ SOLN
INTRAMUSCULAR | Status: AC
  Filled 2019-05-20: qty 8

## 2019-05-20 MED ORDER — FUROSEMIDE 10 MG/ML IJ SOLN
48.0000 mg | Freq: Once | INTRAMUSCULAR | Status: AC
  Administered 2019-05-20: 48 mg via INTRAVENOUS

## 2019-05-20 MED ORDER — TECHNETIUM TC 99M MERTIATIDE
4.9000 | Freq: Once | INTRAVENOUS | Status: AC
  Administered 2019-05-20: 4.9 via INTRAVENOUS

## 2020-05-02 IMAGING — DX DG ABD 1 VIEW
1 series · 1 of 1 positions shown · non-contrast
Comparison: CT scan of June 08, 2018. Radiograph June 17, 2018.

CLINICAL DATA: Left ureteral stone.

EXAM:
ABDOMEN - 1 VIEW

[abdomen kub]
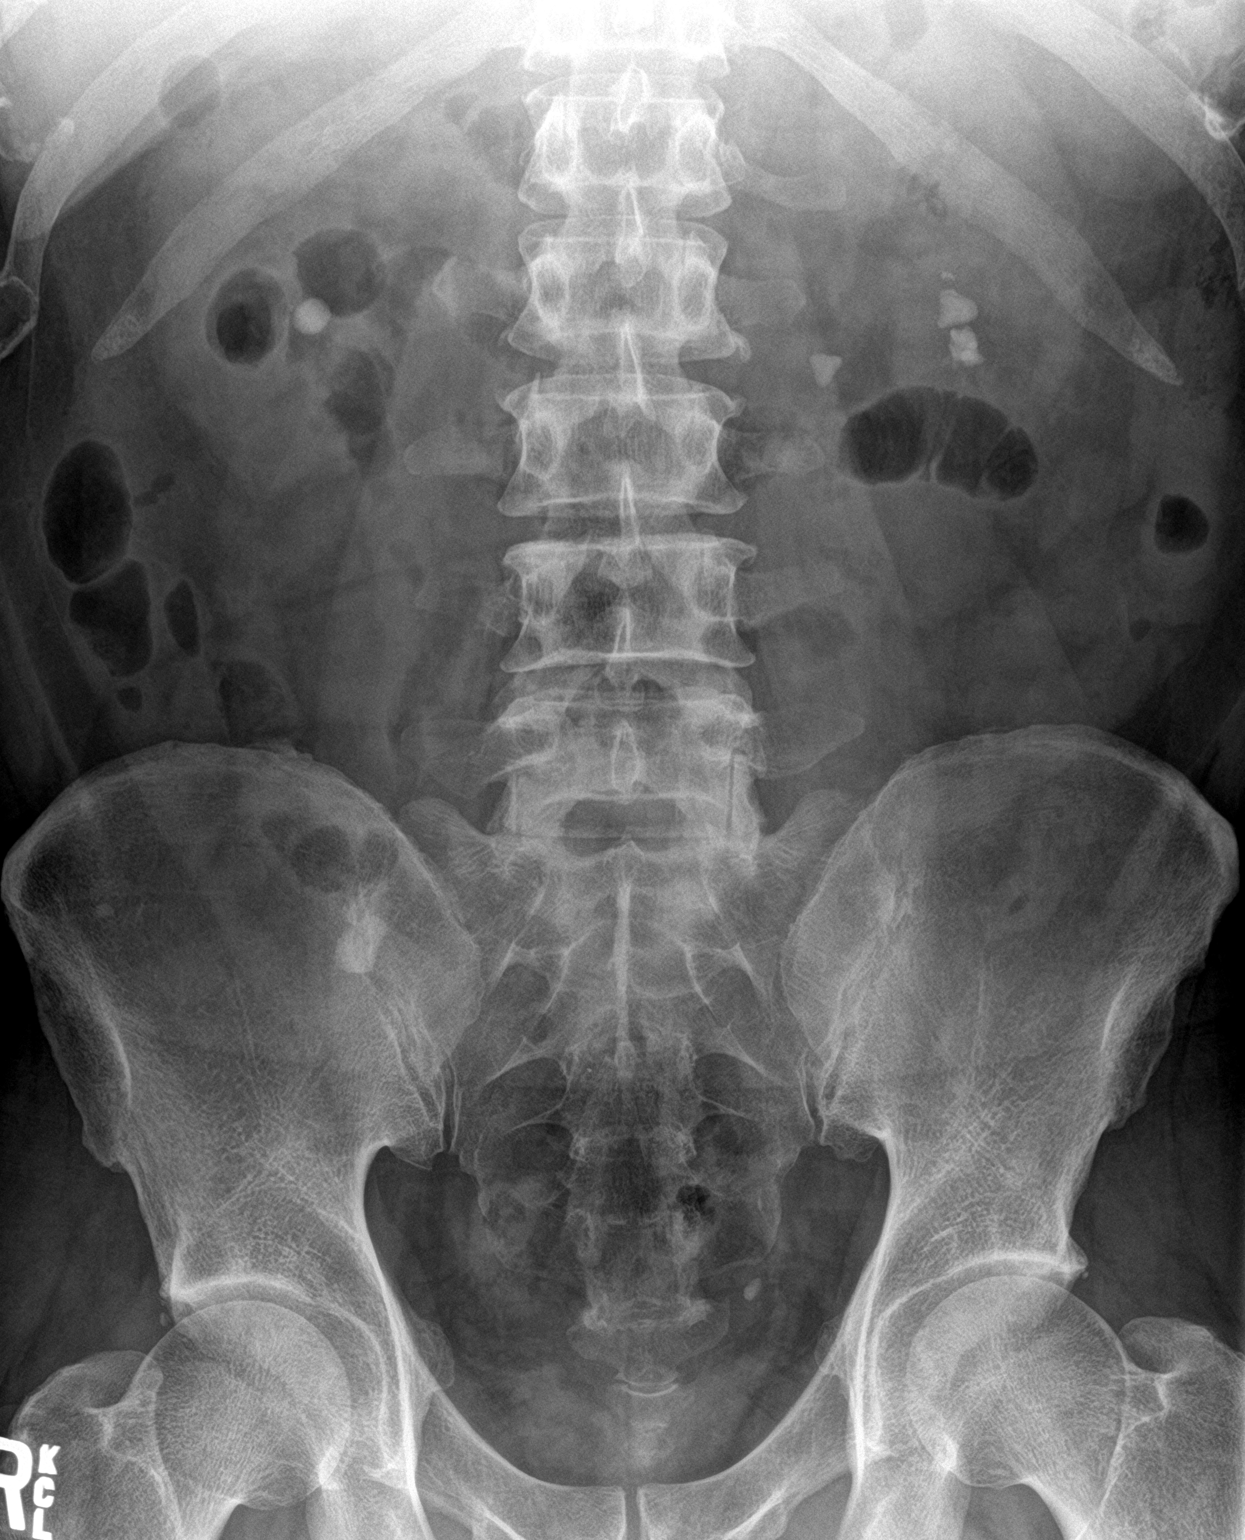

[1 of 1 positions shown; findings below may reference images not displayed]

FINDINGS: The bowel gas pattern is normal. 8 mm right renal calculus is noted.
Multiple left renal calculi are noted with the largest measuring 12
mm in the midpole. Probable 8 mm calculus seen in left renal pelvis
which is migrated since prior exam. 6 mm calculus seen in left-sided
pelvis which may represent ureteral calculus which is unchanged in
position compared to prior exam.
IMPRESSION: Bilateral nephrolithiasis is again noted. Distal left ureteral
calculus noted on prior radiograph is not significantly changed in
position. One of the large left renal calculi noted on prior exam
now appears to be in expected position of left renal pelvis.

## 2024-05-08 ENCOUNTER — Other Ambulatory Visit: Payer: Self-pay | Admitting: Medical Genetics

## 2024-07-30 ENCOUNTER — Other Ambulatory Visit (HOSPITAL_COMMUNITY)
